# Patient Record
Sex: Male | Born: 1950 | Race: White | Hispanic: No | Smoking: Former smoker
Health system: Southern US, Community
[De-identification: ages and names within clinical notes are randomized; demographics above are authoritative.]

## PROBLEM LIST (undated history)

## (undated) DIAGNOSIS — G4730 Sleep apnea, unspecified: Secondary | ICD-10-CM

## (undated) DIAGNOSIS — I10 Essential (primary) hypertension: Secondary | ICD-10-CM

## (undated) DIAGNOSIS — M1990 Unspecified osteoarthritis, unspecified site: Secondary | ICD-10-CM

## (undated) DIAGNOSIS — E119 Type 2 diabetes mellitus without complications: Secondary | ICD-10-CM

## (undated) HISTORY — PX: ANGIOPLASTY: SHX39

## (undated) HISTORY — PX: BACK SURGERY: SHX140

---

## 1995-11-16 DIAGNOSIS — I219 Acute myocardial infarction, unspecified: Secondary | ICD-10-CM

## 1995-11-16 HISTORY — DX: Acute myocardial infarction, unspecified: I21.9

## 2006-06-27 ENCOUNTER — Encounter: Admission: RE | Admit: 2006-06-27 | Discharge: 2006-06-27 | Payer: Self-pay | Admitting: Family Medicine

## 2006-08-30 ENCOUNTER — Encounter: Admission: RE | Admit: 2006-08-30 | Discharge: 2006-08-30 | Payer: Self-pay | Admitting: Family Medicine

## 2006-10-18 ENCOUNTER — Encounter: Admission: RE | Admit: 2006-10-18 | Discharge: 2006-10-18 | Payer: Self-pay | Admitting: Family Medicine

## 2006-11-10 ENCOUNTER — Encounter: Admission: RE | Admit: 2006-11-10 | Discharge: 2006-11-10 | Payer: Self-pay | Admitting: Family Medicine

## 2008-03-19 ENCOUNTER — Encounter: Admission: RE | Admit: 2008-03-19 | Discharge: 2008-03-19 | Payer: Self-pay | Admitting: Family Medicine

## 2008-04-03 ENCOUNTER — Encounter: Admission: RE | Admit: 2008-04-03 | Discharge: 2008-04-03 | Payer: Self-pay | Admitting: Family Medicine

## 2008-05-30 ENCOUNTER — Encounter: Admission: RE | Admit: 2008-05-30 | Discharge: 2008-05-30 | Payer: Self-pay | Admitting: Family Medicine

## 2008-07-24 ENCOUNTER — Encounter: Admission: RE | Admit: 2008-07-24 | Discharge: 2008-07-24 | Payer: Self-pay | Admitting: Neurosurgery

## 2008-08-13 ENCOUNTER — Ambulatory Visit: Payer: Self-pay | Admitting: Thoracic Surgery

## 2008-09-09 ENCOUNTER — Ambulatory Visit: Payer: Self-pay | Admitting: Thoracic Surgery

## 2008-09-09 ENCOUNTER — Inpatient Hospital Stay (HOSPITAL_COMMUNITY): Admission: RE | Admit: 2008-09-09 | Discharge: 2008-09-13 | Payer: Self-pay | Admitting: Neurosurgery

## 2008-09-24 ENCOUNTER — Ambulatory Visit: Payer: Self-pay | Admitting: Thoracic Surgery

## 2008-09-24 ENCOUNTER — Encounter: Admission: RE | Admit: 2008-09-24 | Discharge: 2008-09-24 | Payer: Self-pay | Admitting: Thoracic Surgery

## 2008-10-16 ENCOUNTER — Encounter: Admission: RE | Admit: 2008-10-16 | Discharge: 2008-10-16 | Payer: Self-pay | Admitting: Thoracic Surgery

## 2008-10-16 ENCOUNTER — Ambulatory Visit: Payer: Self-pay | Admitting: Thoracic Surgery

## 2008-12-18 ENCOUNTER — Encounter: Admission: RE | Admit: 2008-12-18 | Discharge: 2008-12-18 | Payer: Self-pay | Admitting: Thoracic Surgery

## 2008-12-18 ENCOUNTER — Ambulatory Visit: Payer: Self-pay | Admitting: Thoracic Surgery

## 2009-02-12 ENCOUNTER — Ambulatory Visit: Payer: Self-pay | Admitting: Thoracic Surgery

## 2009-06-10 ENCOUNTER — Encounter: Admission: RE | Admit: 2009-06-10 | Discharge: 2009-06-10 | Payer: Self-pay | Admitting: Neurosurgery

## 2009-08-13 IMAGING — CR DG CHEST 1V PORT
1 series · 1 of 1 positions shown · non-contrast
Comparison: Chest radiograph 09/10/2008

CLINICAL DATA: Pneumonia

PORTABLE CHEST - 1 VIEW

[view not recorded]
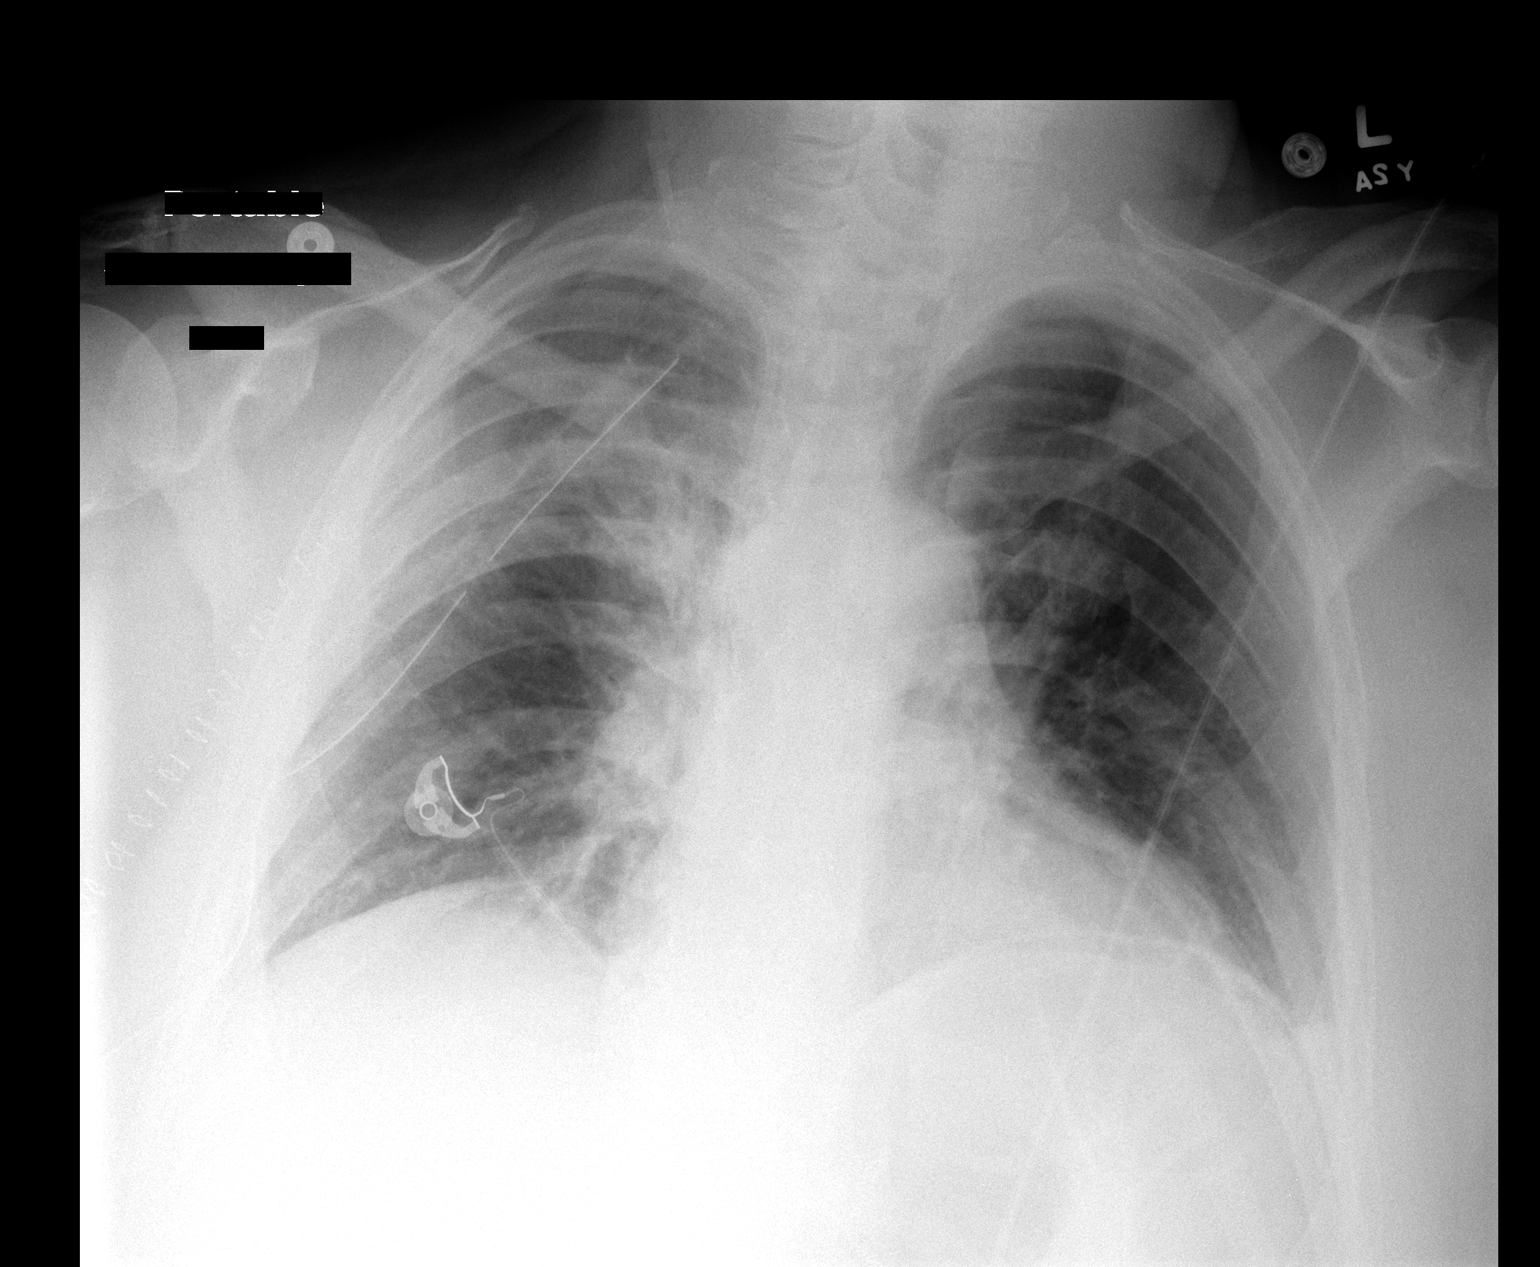

[1 of 1 positions shown; findings below may reference images not displayed]

FINDINGS: Stable cardiac silhouette.  Right central venous line
unchanged.  Interval removal of one right chest tube with a single
remaining right chest tube.  No evidence pneumothorax.  Bibasilar
atelectasis. There is a lucency beneath the left hemidiaphragm
which is felt to represent the colonic splenic flexure.
IMPRESSION: 1. Removal of right chest tube without pneumothorax.  A single
right chest tube remains.
2.  Bibasilar atelectasis.

## 2009-08-15 IMAGING — CR DG CHEST 2V
2 series · 2 of 2 positions shown · non-contrast
Comparison: 09/13/2007.

CLINICAL DATA: Evaluate for pneumonia.  Night sweats.

CHEST - 2 VIEW

[w chest pa]
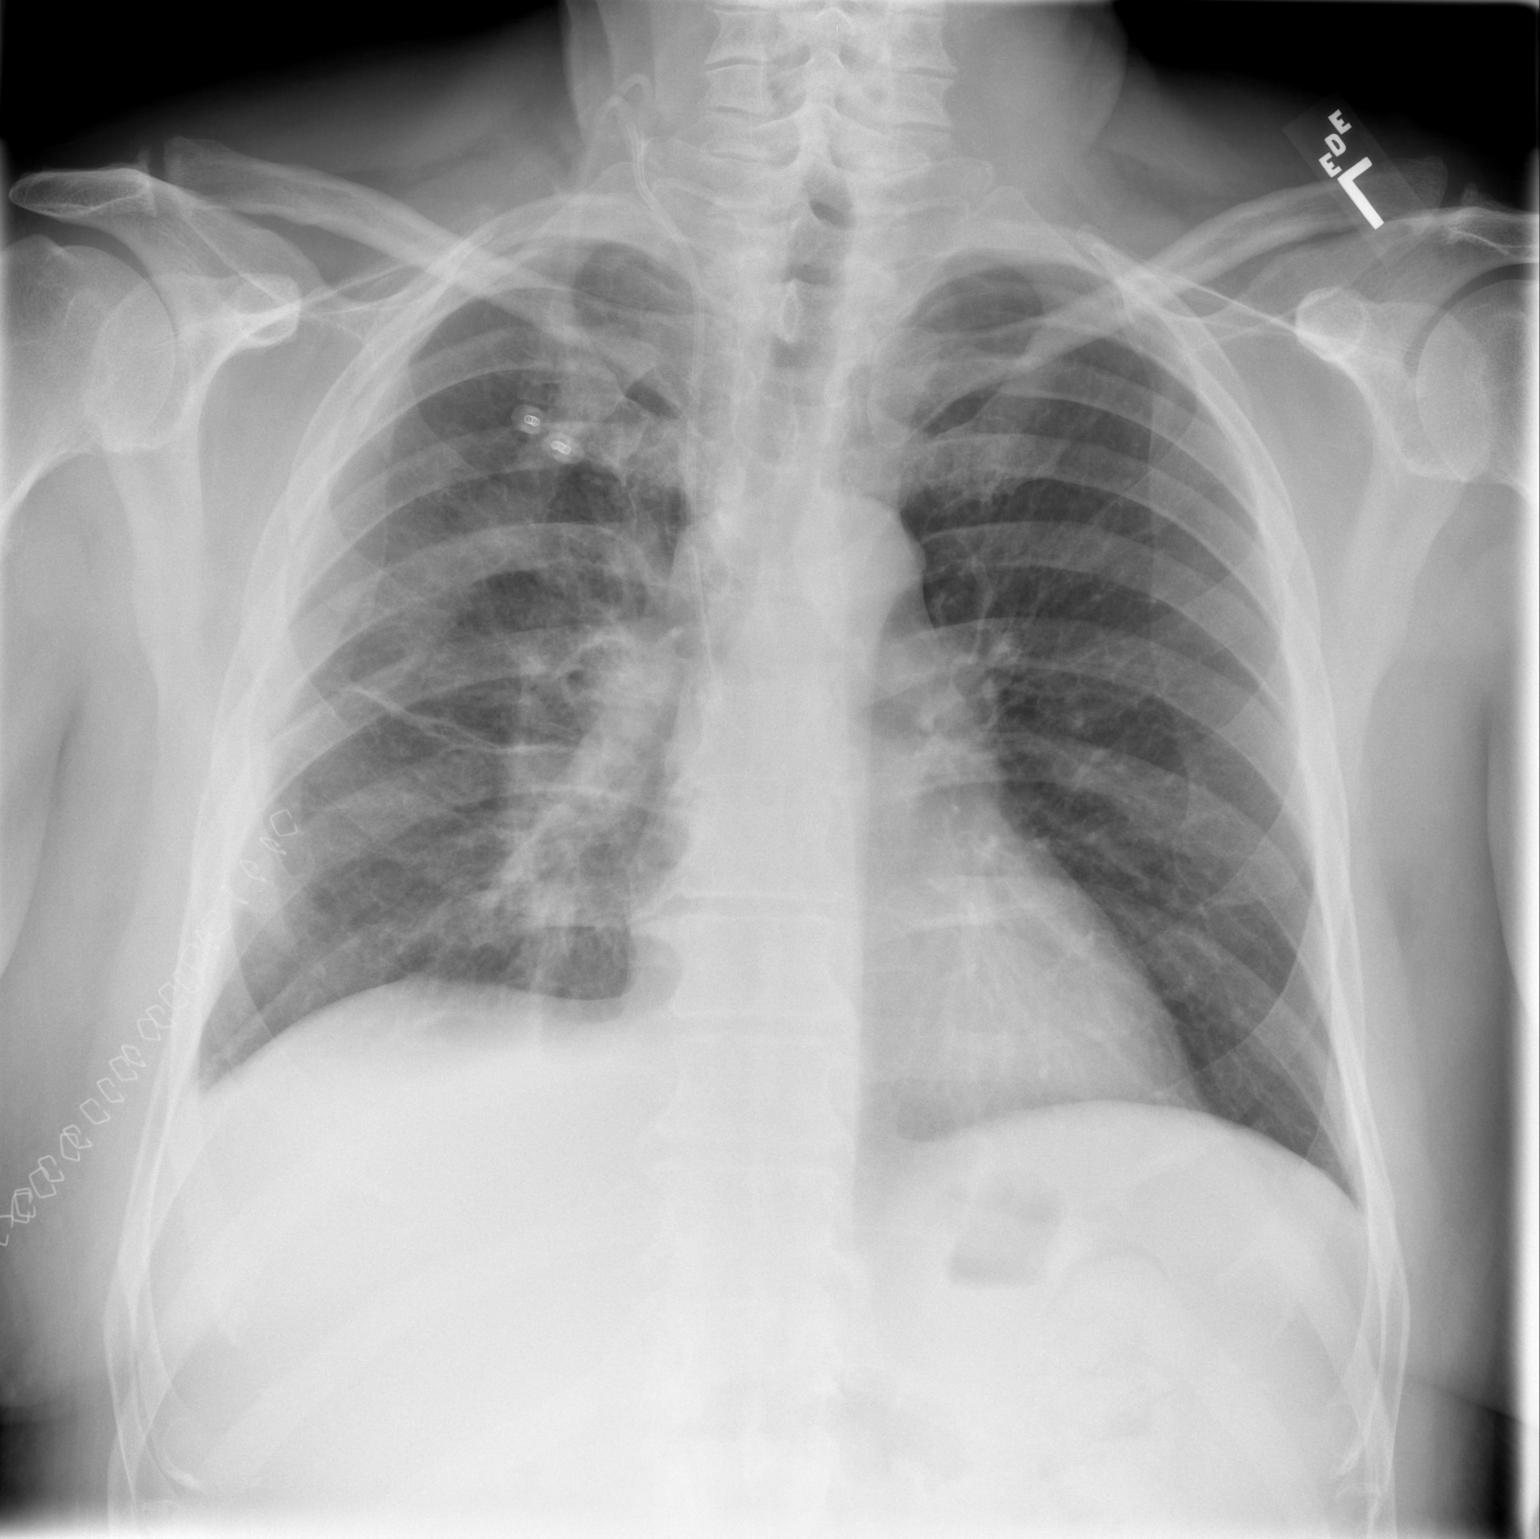

[w chest lat]
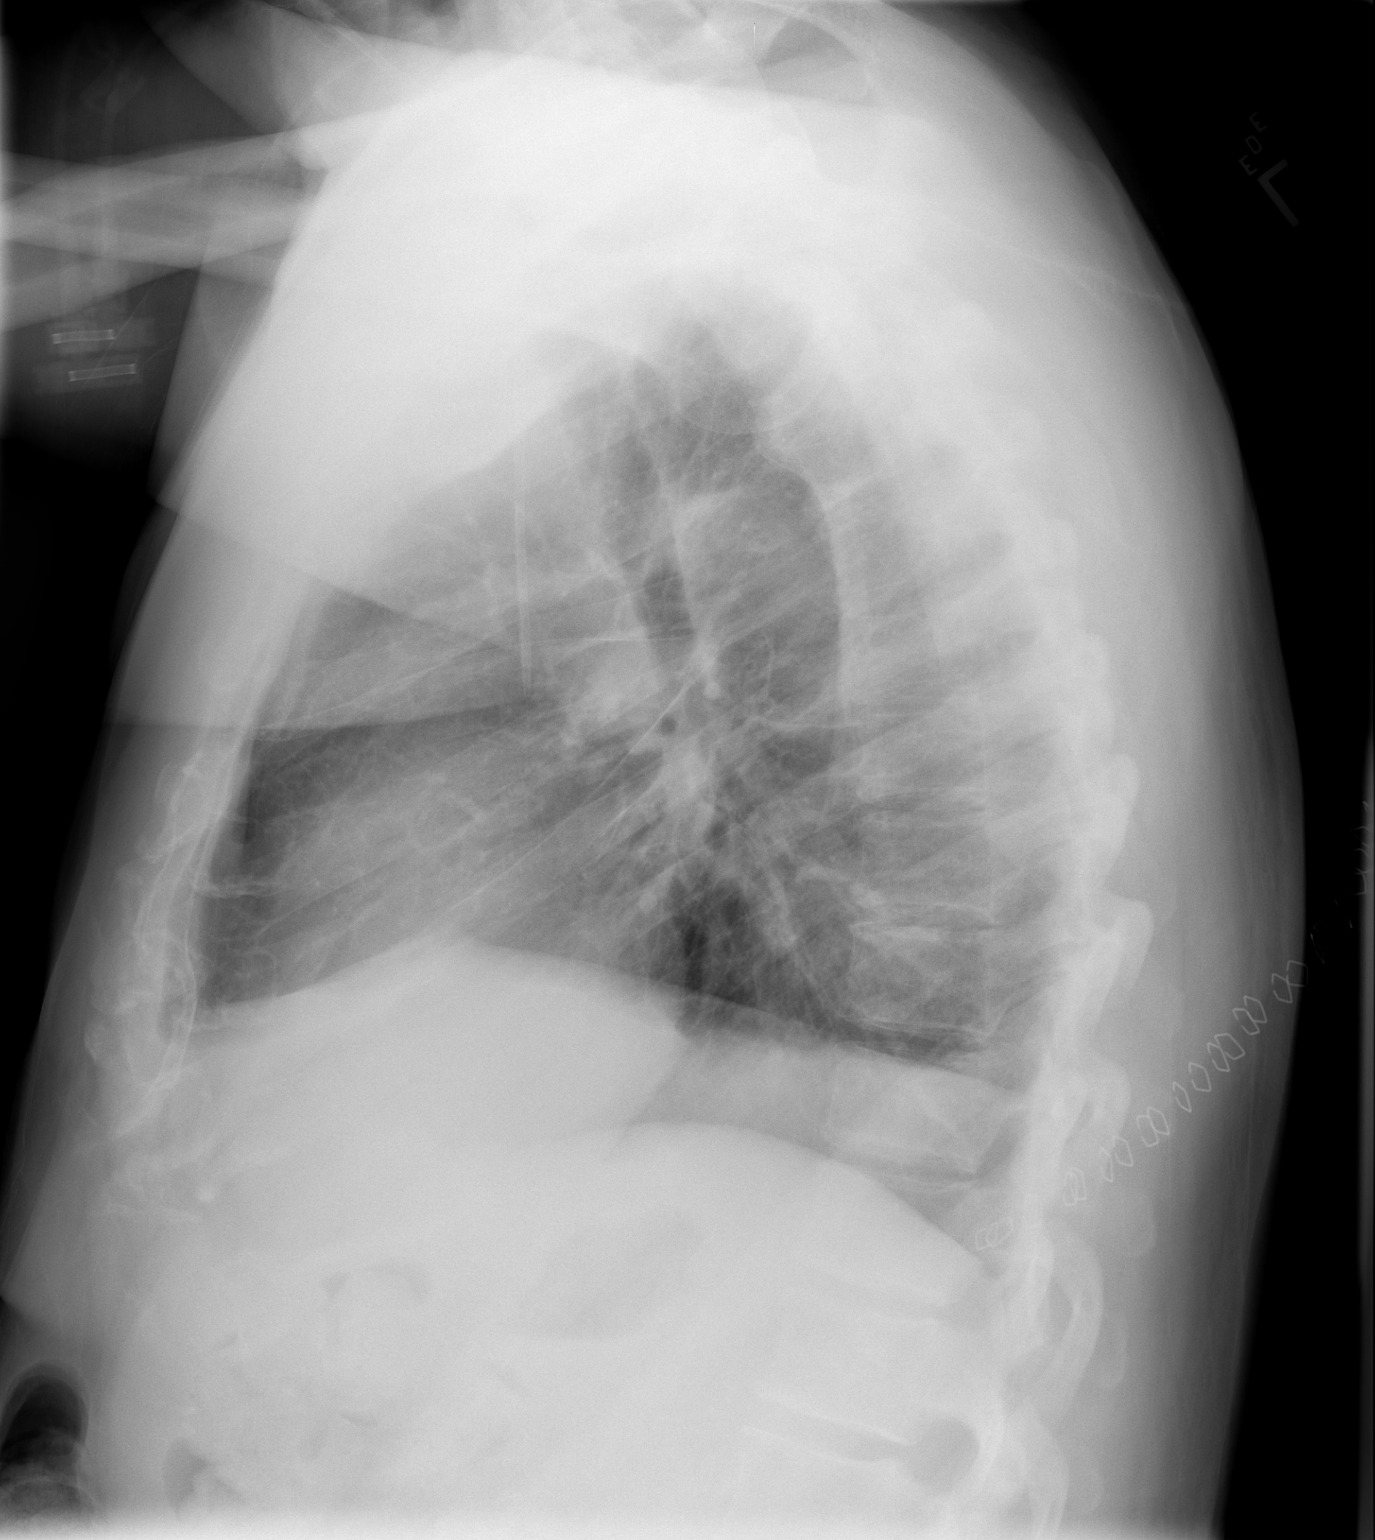

[2 of 2 positions shown; findings below may reference images not displayed]

FINDINGS: Trachea midline.  Right IJ central line unchanged.  No
right pneumothorax.  Skin staples along the right thoracotomy
incision site.  Subsegmental atelectasis is present.  Improved
right pleural effusion, now tiny.  Sub segmental atelectasis is
present without definite airspace disease. Slightly improved
aeration is present in the right base.
IMPRESSION: 1. Slightly improved aeration at the right lung base, with smaller
right pleural effusion.
2.  Stable support apparatus.

## 2009-08-26 IMAGING — CR DG CHEST 2V
2 series · 2 of 2 positions shown · non-contrast
Comparison: Chest radiograph 09/13/2008

CLINICAL DATA: Recent spinal surgery.

CHEST - 2 VIEW

[w chest pa]
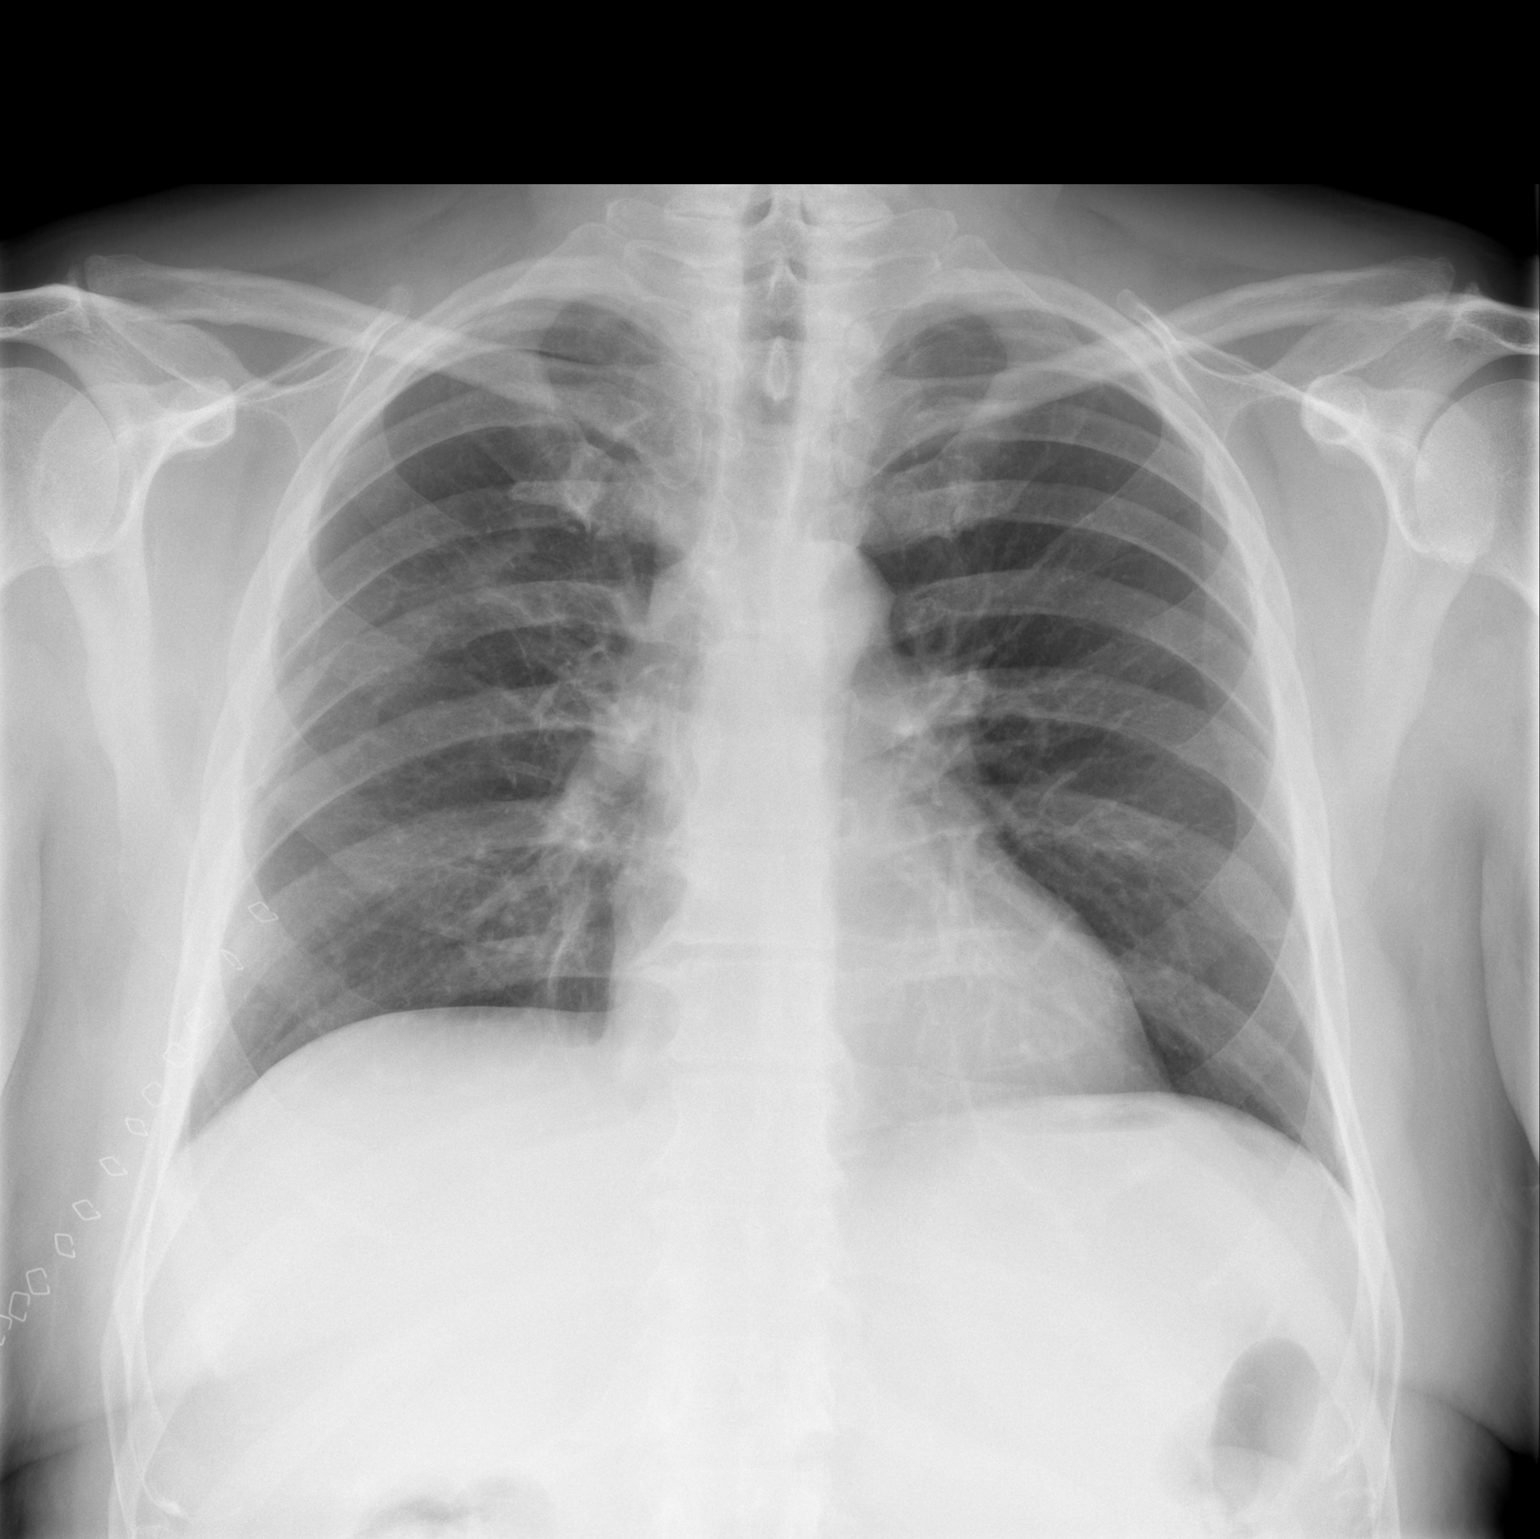

[w chest lat]
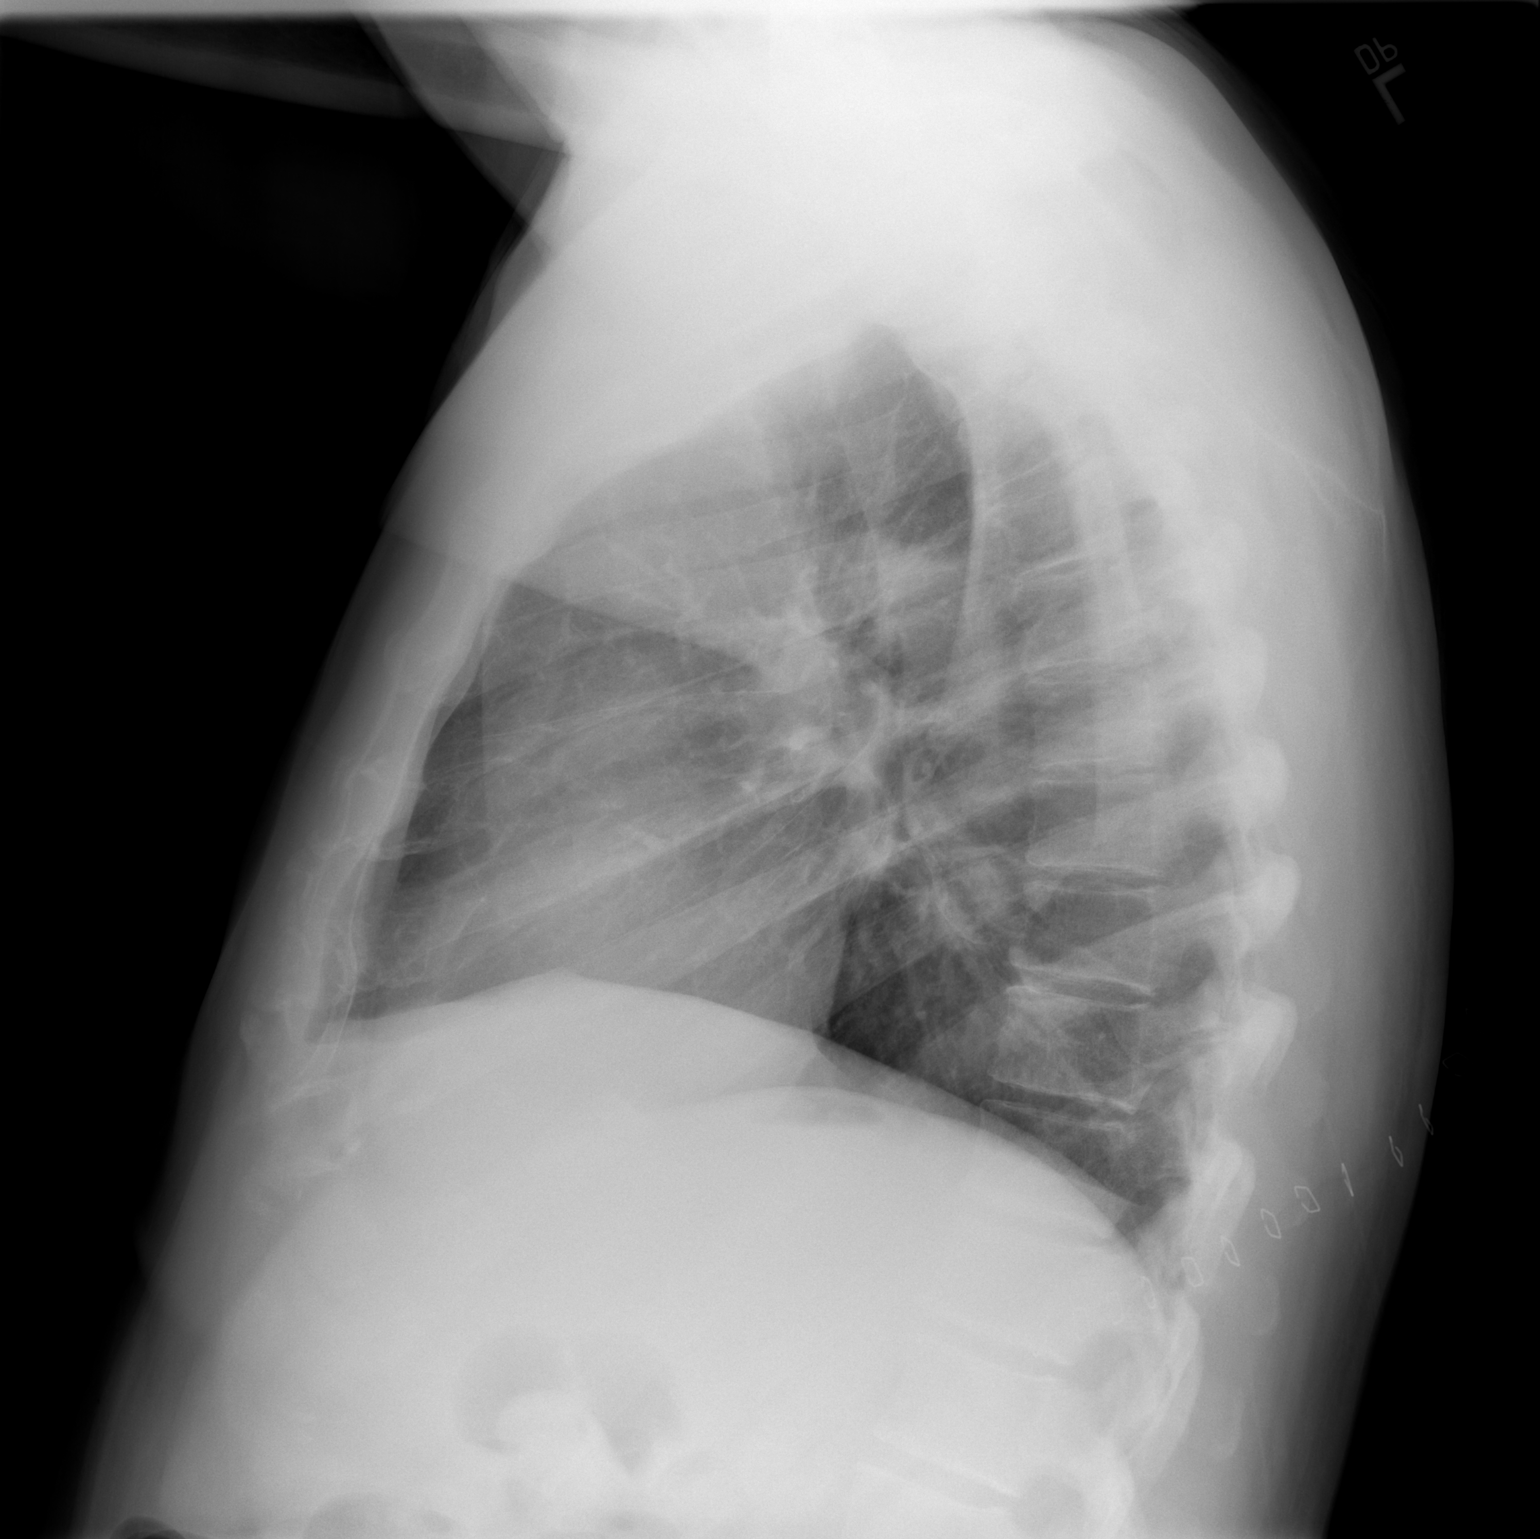

[2 of 2 positions shown; findings below may reference images not displayed]

FINDINGS: Interval removal of central venous line.  No evidence of
pneumothorax.  Normal cardiac silhouette.  Skin staples along the
right flank.  No evidence of pleural effusion.
IMPRESSION: No acute cardiopulmonary process.

## 2009-09-05 ENCOUNTER — Ambulatory Visit (HOSPITAL_COMMUNITY): Admission: RE | Admit: 2009-09-05 | Discharge: 2009-09-06 | Payer: Self-pay | Admitting: Neurosurgery

## 2010-12-07 ENCOUNTER — Encounter: Payer: Self-pay | Admitting: Neurosurgery

## 2011-02-18 LAB — GLUCOSE, CAPILLARY
Glucose-Capillary: 106 mg/dL — ABNORMAL HIGH (ref 70–99)
Glucose-Capillary: 119 mg/dL — ABNORMAL HIGH (ref 70–99)
Glucose-Capillary: 135 mg/dL — ABNORMAL HIGH (ref 70–99)
Glucose-Capillary: 147 mg/dL — ABNORMAL HIGH (ref 70–99)
Glucose-Capillary: 97 mg/dL (ref 70–99)

## 2011-02-18 LAB — CBC
HCT: 39.6 % (ref 39.0–52.0)
Hemoglobin: 13.6 g/dL (ref 13.0–17.0)
MCHC: 34.2 g/dL (ref 30.0–36.0)
MCV: 97.8 fL (ref 78.0–100.0)
Platelets: 170 10*3/uL (ref 150–400)
RBC: 4.05 MIL/uL — ABNORMAL LOW (ref 4.22–5.81)
RDW: 12.9 % (ref 11.5–15.5)
WBC: 8.8 10*3/uL (ref 4.0–10.5)

## 2011-02-18 LAB — BASIC METABOLIC PANEL
BUN: 27 mg/dL — ABNORMAL HIGH (ref 6–23)
CO2: 25 mEq/L (ref 19–32)
Calcium: 9.1 mg/dL (ref 8.4–10.5)
Chloride: 109 mEq/L (ref 96–112)
Creatinine, Ser: 1.47 mg/dL (ref 0.4–1.5)
GFR calc Af Amer: 60 mL/min — ABNORMAL LOW (ref >60)
GFR calc non Af Amer: 50 mL/min — ABNORMAL LOW (ref >60)
Glucose, Bld: 104 mg/dL — ABNORMAL HIGH (ref 70–99)
Potassium: 4.9 mEq/L (ref 3.5–5.1)
Sodium: 140 mEq/L (ref 135–145)

## 2011-02-18 LAB — TYPE AND SCREEN
ABO/RH(D): A POS
Antibody Screen: NEGATIVE

## 2011-02-18 LAB — DIFFERENTIAL
Basophils Absolute: 0 10*3/uL (ref 0.0–0.1)
Basophils Relative: 0 % (ref 0–1)
Eosinophils Absolute: 0.4 10*3/uL (ref 0.0–0.7)
Eosinophils Relative: 4 % (ref 0–5)
Lymphocytes Relative: 21 % (ref 12–46)
Lymphs Abs: 1.8 10*3/uL (ref 0.7–4.0)
Monocytes Absolute: 0.4 10*3/uL (ref 0.1–1.0)
Monocytes Relative: 4 % (ref 3–12)
Neutro Abs: 6.2 10*3/uL (ref 1.7–7.7)
Neutrophils Relative %: 71 % (ref 43–77)

## 2011-03-30 NOTE — Op Note (Signed)
NAME:  Tim Torres, Tim Torres            ACCOUNT NO.:  [id number]   MEDICAL RECORD NO.:  [id number]          PATIENT TYPE:  INP   LOCATION:  3315                         FACILITY:  [facility]   PHYSICIAN:  Kathaleen Maser. Pool, M.D.    DATE OF BIRTH:  10/25/1951   DATE OF PROCEDURE:  09/09/2008  DATE OF DISCHARGE:                               OPERATIVE REPORT   PREOPERATIVE DIAGNOSIS:  Right carpal tunnel syndrome.   POSTOPERATIVE DIAGNOSIS:  Right carpal tunnel syndrome.   PROCEDURE NAME:  Right carpal tunnel release.   SURGEON:  Kathaleen Maser. Pool, MD   ANESTHESIA:  General endotracheal.   PREMEDICATION:  [name] is a 60 year old male with history of right  hand pain, paresthesias, and weakness with a right-sided carpal tunnel  syndrome, confirmed by EMGs and nerve conduction studies, failing  conservative management.  The patient had been counseled as to his  options where he decided to proceed with a right-sided carpal tunnel  release.  This is being done coincidentally with his transthoracic  thoracic and microdiskectomy earlier today.   OPERATIVE NOTE:  The patient was placed on the operative table in a  supine position.  After adequate level of anesthesia achieved, the  patient was positioned with his right arm outstretched.  The patient's  right upper extremity was prepped and draped sterilely.  A #15 blade was  used to make a linear skin incision on the mid palmar crease just distal  to the distal wrist fold.  This carried down sharply through the  subcutaneous tissue.  Palmar fascia was divided.  Transverse carpal  ligament was then identified.  Transverse carpal ligament was then  divided exposing the underlying median nerve.  Freer elevator was then  used to protect the median nerve.  The transverse carpal ligament was  then divided distally into the palm completely releasing its fibers.  The Freer elevator was then redirected proximally.  The transverse  carpal ligament was then  divided into the forearm without difficulty.  At this point, a very thorough decompression of the median nerve had  been achieved.  There was no evidence of injury to the median nerve.  Wound was then irrigated with antibiotic solution.  We then closed in  typical fashion.  A sterile dressing was applied.  There were no  complications.  The patient tolerated the procedure well and he returned  to the recovery room for postoperative care.           ______________________________  Kathaleen Maser. Pool, M.D.     HAP/MEDQ  D:  09/09/2008  T:  09/10/2008  Job:  [id number]

## 2011-03-30 NOTE — Op Note (Signed)
NAMEADYN, SERNA            ACCOUNT NO.:  [id number]   MEDICAL RECORD NO.:  [id number]          PATIENT TYPE:  INP   LOCATION:  3315                         FACILITY:  [facility]   PHYSICIAN:  [name], M.D. DATE OF BIRTH:  17-May-1951   DATE OF PROCEDURE:  09/09/2008  DATE OF DISCHARGE:                               OPERATIVE REPORT   PREOPERATIVE DIAGNOSIS:  Herniated T5-6, 6-7 thoracic disk.   POSTOPERATIVE DIAGNOSIS:  Herniated T5-6, 6-7 thoracic disk.   OPERATIONS PERFORMED:  1. Right thoracotomy.  2. Exposure of T5-6 and 6-7 disk.   SURGEON:  [name], MD   FIRST ASSISTANTS:  1. Stephanie Acre. Dominick, PA-C  2. Henry A. Pool, MD   General anesthesia.   This patient had a T5-6 and 6-7 disk rupture and was undergoing thoracic  diskectomy by Dr. Jordan Likes.  We were asked do to the exposure.   After general anesthesia and insertion of a dual-lumen tube, the patient  turned to the right lateral thoracotomy position, was prepped and draped  in the usual sterile manner.  A posterolateral thoracotomy was made over  the fifth intercostal space with latissimus being divided and the  serratus reflected anteriorly.  Fifth intercostal space was entered.  Two TPAs were placed at right angles.  The right lung was deflated, and  over the fifth rib, the pleura was dissected up dividing the segments of  the fifth and sixth segmentals with electrocautery.  Then the rib head  of the sixth rib was also dissected up and the rib head of the seventh  rib.  After all of these had been done, the T5-6 and 6-7 disks were  identified and by marking them with a dissector and taking a picture.  After this had been done, Dr. Jordan Likes did the diskectomy, which will be  dictated separately.  We were then came in at the end after the  diskectomy, made 2 incisions at the anterior and midaxillary line at the  eighth intercostal space and sutured in 2-0 silks in each incision and  inserted two 28  chest tubes.  Marcaine block was done in the usual  fashion.  A single On-Q was inserted in the usual fashion.  The chest  was closed with 4 pericostals driven through the sixth rib and passing  around the fifth rib.  Five pericostals driven through the sixth rib and  passed around the fifth rib, #1 Vicryl in the muscle layer, 2-0 Vicryl  in the subcutaneous tissue, and Ethicon skin clips.  The patient was  returned to the recovery room.  After this, Dr. Jordan Likes then did a carpal  tunnel, which will be dictated separately.  The lung was re-expanded  under direct vision prior to closure.      [name], M.D.  Electronically Signed     DPB/MEDQ  D:  09/09/2008  T:  09/10/2008  Job:  [id number]

## 2011-03-30 NOTE — Op Note (Signed)
NAMEJANIEL, CRISOSTOMO NO.:  [id number]   MEDICAL RECORD NO.:  [id number]          PATIENT TYPE:  INP   LOCATION:  2899                         FACILITY:  [facility]   PHYSICIAN:  Kathaleen Maser. Pool, M.D.    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   PROCEDURE:  Right transthoracic T5-6 and T6-7 microdiskectomy.   SURGEON:  Kathaleen Maser. Pool, MD   ASSISTANT:  Reinaldo Meeker, MD   THORACIC SURGEON:  [name], MD   ANESTHESIA:  General endotracheal.   INDICATION:  Mr. Delange is a 60 year old male with history of mid back  pain with lower extremity dysfunction consistent with a thoracic  myelopathy.  Workup demonstrates evidence of large central disk  herniations at T5-6 and T6-7 with marked spinal cord compression.  The  patient counseled as to his options.  He decided to proceed with a T5-6  and T6-7 transthoracic microdiskectomy in hopes improving his symptoms.   OPERATIVE NOTE:  The patient was brought to the operating room and  placed on the operative table in supine position.  After adequate level  of anesthesia was achieved, the patient was placed in left lateral  decubitus position and appropriately padded.  The patient's thoracic  region was prepped and draped sterilely.  Dr. Karle Plumber then  performed a right-sided thoracotomy through the fifth interspace.  Retractors were placed.  The lung was immobilized anteromedially after  being deflated with a double-lumen tube.  Pleura overlying the rib heads  at T6 and T7 were immobilized.  Markers were placed in the disk space.  In fact, approach was made to T4-5 and T5-6.  The T4-5 marker was  removed.  Approach was then redirected  to T5-6 and T6-7.  At that point,  I then removed the rib heads of both T6 and T7.  Disk space was then  incised #15 blade in rectangular fashion.  Posterior aspect of the disk  was then removed.  The posterior longitudinal ligament was then  identified.  Microscope was then brought to do the microdissection.  The  bodies of T5 and T6 were then undercut using a Kerrison rongeurs.  Posterior longitudinal ligament was then elevated and resected in  piecemeal fashion using Kerrison rongeurs.  Underlying thecal sac was  then identified.  A large central disk herniation was encountered along  the way.  This was then dissected free using micro nerve hooks and  pituitary rongeurs.  The damaged posterior longitudinal ligament was  then further resected.  All elements of the disk herniation completely  resected.  The dural defect and compression was completely removed.  At  this point, a very thorough decompression achieved.  All elements of the  disk herniation were resected.  There was no injury to thecal sac or  nerve roots.  Procedure was then repeated at T6-7  again without  complications.  Wound was then irrigated with antibiotic solution.  Gelfoam was placed topically at both levels for hemostasis,  which was found to be good.  Microscope retractor system were removed.  Dr. Edwyna Shell now returns to close the chest in typical fashion.  There  were no complications.  The patient tolerated the procedure well.  He  remains in the operating room where the plan is for him to undergo a  right-sided carpal tunnel release.           ______________________________  Kathaleen Maser. Pool, M.D.     HAP/MEDQ  D:  09/09/2008  T:  09/10/2008  Job:  [id number]

## 2011-03-30 NOTE — Letter (Signed)
February 12, 2009   Henry A. Pool, MD  [address]  [place], [place] [address]   Re:  JABBAR, PALMERO                  DOB:  [birth year]   Dear Sherilyn Cooter,   I saw the patient back today.  He still has a moderate amount of pain.  It is hard to tell if this is post thoracotomy which is what I thought  it was last time or if somewhat related to his T5-T6 diskectomy.  He  does have pain more proximal to her incision, which would make me worry  that this may be more of a nerve root problem, but whatever he has it  appears to be stable, maybe slightly improved.  He also complains of  some swelling in his hand secondary to his right hand where he had a  previous carpal tunnel.  So, the patient, although he is functioning  appears to continue to have pain.  I think this will be a long problem.  His incision is well healed.   His blood pressure is 115/69, pulse 68, respirations 18, sats were 95%.   I will let she follow him for long term.  From my standpoint, everything  appears to be stable, but I did tell him I will see him again at any  time if he has any concerns.   Sincerely,   [name], M.D.  Electronically Signed   DPB/MEDQ  D:  02/12/2009  T:  02/13/2009  Job:  [id number]

## 2011-03-30 NOTE — Assessment & Plan Note (Signed)
OFFICE VISIT   Tim Torres, Tim Torres  DOB:  April 21, 1951                                        September 24, 2008  CHART #:  [id number]   The patient came for followup today.  His incisions were healed.  His  blood pressure is 110/63, pulse 68, respirations 18, and sats were 98%.  We removed his staples and his chest tube sutures.  His chest x-ray was  stable after his exposure for T56 and 67.  I will plan to see him back.  I gave him a prescription for Voltaren 75 mg twice a day because of his  increased pain.  I will plan to see him back again in 3 weeks with the  chest x-ray.   Tim Torres, M.D.  Electronically Signed   DPB/MEDQ  D:  09/24/2008  T:  09/24/2008  Job:  [id number]

## 2011-03-30 NOTE — Letter (Signed)
August 13, 2008   Sherilyn Cooter A. Pool, MD  [address] [place], [place] [address]   Re:  [name], [name]                  DOB:  01-22-1951   Dear Sherilyn Cooter,   I appreciate the opportunity of seeing the patient.  This patient was  worked up because of severe back pain radiating to both flanks as well  as down to his legs and shoulders.  He also has numbness in both arms.  Apparently, he had bilateral carpal tunnel syndrome and a herniated disk  at T5-6 and one at T6-7.  He is referred here for discussion for  possible exposure for diskectomy and/or fusion.   MEDICATIONS:  Aspirin, ibuprofen, Toprol, Neurontin 300 mg 3 times a  day, lisinopril 40 mg a day, allopurinol 300 mg a day, lovastatin,  metformin 800 mg, Amaryl 4 mg one-half tablet a day, and Valium.   ALLERGIES:  He is allergic to shellfish.   PAST MEDICAL HISTORY:  He has hypertension, dyslipidemia, and diabetes.   FAMILY HISTORY:  Noncontributory.   SOCIAL HISTORY:  He is married.  He has 2 children.  He is presently  unemployed.  Quit smoking in 1983.  He does not drink alcohol on a  regular basis.   REVIEW OF SYSTEMS:  Vital Signs:  He had some weight loss of 75 pounds  that he has done intentionally.  He is 6 feet 2 inches.  He is 240  pounds.  Cardiac:  No angina or atrial fibrillation.  Pulmonary:  No  hemoptysis, bronchitis, asthma, or wheezing.  GI:  No nausea, vomiting,  constipation, or diarrhea.  GU:  No kidney disease, dysuria, or frequent  urination.  Vascular:  No claudication, DVT, or TIAs.  Neurologic:  No  dizziness, headaches, blackouts, or seizures.  Musculoskeletal:  See  history of present illness.  Psychiatric:  No depression or nervous.  Eye/ENT:  No changes in eyesight or hearing.  Hematologic:  No problems  with bleeding or clotting disorders.   PHYSICAL EXAMINATION:  VITAL SIGNS:  His blood pressure is 121/73, pulse  63, respirations 18, and sats were 94%.  GENERAL:  He is  a well-developed Caucasian male, in no acute distress.  HEAD, EYES, EARS, NOSE, AND THROAT:  Unremarkable.  NECK:  Supple without thyromegaly.  There is no supraclavicular or  axillary adenopathy.  CHEST:  Clear to auscultation and percussion.  HEART:  Regular sinus rhythm.  No murmurs.  ABDOMEN:  Soft.  There is no hepatosplenomegaly.  Pulses are 2+.  There  is no clubbing or edema.  NEUROLOGIC:  He is oriented x3.  Sensory and motor intact.  Cranial  nerves intact.   I think he would need a right thoracotomy for exposure of 5, 6, and 7  vertebral bodies.  I have discussed the risk of procedure to include  infection, bleeding, and pain.  We will call your office and schedule  the patient.  I appreciate the opportunity of seeing the patient.   Tim Torres, M.D.  Electronically Signed   DPB/MEDQ  D:  08/13/2008  T:  08/13/2008  Job:  [id number]

## 2011-03-30 NOTE — Assessment & Plan Note (Signed)
OFFICE VISIT   Tim Torres, Tim Torres  DOB:  1951/09/13                                        October 16, 2008  CHART #:  [id number]   HISTORY OF PRESENT ILLNESS:  This is a 60 year old Caucasian male, who  is status post right thoracotomy, exposure T5-6 and T6-7 by Dr. Edwyna Shell  and Dr. Jordan Likes on September 09, 2008.  The patient was last seen in the  office on September 24, 2008.  At that time, his only complaint was  incisional pain.  He was given Voltaren 75 mg p.o. 2 times daily.  He  did take this for several days.  He states, however, he found ibuprofen  alleviate his pain better.  He still currently has occasional incisional  pain.  Denies any shortness of breath.   PHYSICAL EXAMINATION:  GENERAL:  This is a pleasant 60 year old  Caucasian male, who is in no acute distress, who is alert and  cooperative.  VITAL SIGNS:  BP 105/63, pulse rate 74, respirations 18, and O2 sat 96%  on room air.  CARDIOVASCULAR:  Regular rate and rhythm.  PULMONARY:  Lungs are clear to auscultation bilaterally.  No rales,  wheezes, or rhonchi.  CHEST:  Right posterior chest wounds clean, dry, and well healed.   Chest x-ray done today revealed mild peribronchial thickening.  No  infiltrate or effusion seen, however.   IMPRESSION AND PLAN:  1. Surgically stable, status post right thoracotomy and exposure T5-6      and T6-7.  2. The patient will return to see Dr. Edwyna Shell in 2 months with a chest      x-ray.  3. The patient has another followup appointment to see Dr. Jordan Likes on      November 27, 2008.  The patient is to contact to office if any      further questions or problems in the interim.   Tim Torres, M.D.  Electronically Signed   DZ/MEDQ  D:  10/16/2008  T:  10/16/2008  Job:  [id number]   cc:   Tim Torres, M.D.

## 2011-03-30 NOTE — Letter (Signed)
December 18, 2008   Henry A. Pool, MD  [address]  [place], [place] [address]   Re:  Tim Torres, SENNE                  DOB:  1951-11-01   Dear [name],   I saw the patient back in the office today.  He is still having a  moderate amount of pain, had his T5-T6 discectomy.  He states that this  is gradually improving, but he still has pain mainly at the anterior  axial line near the costal margin.  This sounds like more  postthoracotomy pain than disc pain, but since he was in the same level,  I really can not tell.  He does say he is gradually getting better.  His  chest x-ray showed normal postoperative pain.  His blood pressure was  111/64, pulse 64, respirations 18, and sats were 97%.  I will see him  back again in 2 months for a final check.  Hopefully, his pain will be  improved at that time.   Sincerely,   [name], M.D.  Electronically Signed   DPB/MEDQ  D:  12/18/2008  T:  12/18/2008  Job:  [id number]

## 2011-04-02 NOTE — Discharge Summary (Signed)
NAME:  Tim Torres, Tim Torres            ACCOUNT NO.:  [id number]   MEDICAL RECORD NO.:  [id number]          PATIENT TYPE:  INP   LOCATION:  3028                         FACILITY:  [facility]   PHYSICIAN:  Kathaleen Maser. Pool, M.D.    DATE OF BIRTH:  Nov 26, 1950   DATE OF ADMISSION:  09/09/2008  DATE OF DISCHARGE:  09/13/2008                               DISCHARGE SUMMARY   FINAL DIAGNOSES:  1. T5-6 and T6-7 herniated nucleus pulposus with myelopathy.  2. Right-sided carpal tunnel syndrome.   HISTORY OF PRESENT ILLNESS:  [name] is a 60 year old male with  incapacitating mid back pain and lower extremity symptoms consistent  with thoracic myelopathy.  Workup demonstrates evidence of significant  disk herniations at T5-6 and T6-7 with subsequent spinal cord  compression.  The patient also has coincidental right-sided carpal  tunnel syndrome of a severe nature confirmed by EMGs and nerve  conduction studies.  The patient presents now for surgery in hopes of  improving his symptoms.   HOSPITAL COURSE:  The patient was taken to the operating room where  uncomplicated right-sided transthoracic microdiskectomy at T5-6 and T6-7  were performed.  He subsequently underwent a right-sided carpal tunnel  release.  Postoperatively, the patient did very well.  His back pain and  lower extremity symptoms were resolved.  Strength and sensation were  intact.  Wound was healing well.  He was gradually mobilized.  Chest  tubes were removed in a routine fashion after x-rays confirmed  resolution of pneumothorax.  At the time of discharge, the patient is  ambulating without difficulty.  He is tolerating regular diet.  His pain  is well controlled with oral medication.  His carpal tunnel syndrome  symptoms have resolved.  Overall, he is doing very well and happy with  his course.   CONDITION AT DISCHARGE:  Improved.   DISCHARGE DISPOSITION:  The patient will follow up in 1 week in my  office.  He will also  follow up with Dr. Edwyna Shell.            ______________________________  Kathaleen Maser Pool, M.D.     HAP/MEDQ  D:  10/22/2008  T:  10/23/2008  Job:  [id number]

## 2011-08-17 LAB — COMPREHENSIVE METABOLIC PANEL
ALT: 22
ALT: 28
AST: 23
AST: 29
Albumin: 2.9 — ABNORMAL LOW
Albumin: 3.9
Alkaline Phosphatase: 66
Alkaline Phosphatase: 76
BUN: 18
BUN: 24 — ABNORMAL HIGH
CO2: 22
CO2: 29
Calcium: 8.9
Calcium: 9.4
Chloride: 105
Chloride: 106
Creatinine, Ser: 1.22
Creatinine, Ser: 1.41
GFR calc Af Amer: >60
GFR calc Af Amer: >60
GFR calc non Af Amer: 52 — ABNORMAL LOW
GFR calc non Af Amer: >60
Glucose, Bld: 105 — ABNORMAL HIGH
Glucose, Bld: 113 — ABNORMAL HIGH
Potassium: 4
Potassium: 4.9
Sodium: 137
Sodium: 139
Total Bilirubin: 0.9
Total Bilirubin: 1.1
Total Protein: 5.7 — ABNORMAL LOW
Total Protein: 6.4

## 2011-08-17 LAB — CBC
HCT: 31.9 — ABNORMAL LOW
HCT: 32.7 — ABNORMAL LOW
HCT: 37.6 — ABNORMAL LOW
HCT: 40.3
Hemoglobin: 11.1 — ABNORMAL LOW
Hemoglobin: 11.3 — ABNORMAL LOW
Hemoglobin: 12.7 — ABNORMAL LOW
Hemoglobin: 13.8
MCHC: 33.8
MCHC: 34.1
MCHC: 34.5
MCHC: 34.7
MCV: 95.2
MCV: 95.2
MCV: 95.4
MCV: 95.7
Platelets: 148 — ABNORMAL LOW
Platelets: 148 — ABNORMAL LOW
Platelets: 175
Platelets: 192
RBC: 3.33 — ABNORMAL LOW
RBC: 3.43 — ABNORMAL LOW
RBC: 3.96 — ABNORMAL LOW
RBC: 4.23
RDW: 13.1
RDW: 13.4
RDW: 13.5
RDW: 13.6
WBC: 5.2
WBC: 7.7
WBC: 8.2
WBC: 8.8

## 2011-08-17 LAB — BASIC METABOLIC PANEL
BUN: 24 — ABNORMAL HIGH
BUN: 28 — ABNORMAL HIGH
BUN: 28 — ABNORMAL HIGH
CO2: 23
CO2: 25
CO2: 26
Calcium: 8.3 — ABNORMAL LOW
Calcium: 8.5
Calcium: 8.6
Chloride: 106
Chloride: 108
Chloride: 108
Creatinine, Ser: 1.12
Creatinine, Ser: 1.4
Creatinine, Ser: 1.46
GFR calc Af Amer: >60
GFR calc Af Amer: >60
GFR calc Af Amer: >60
GFR calc non Af Amer: 50 — ABNORMAL LOW
GFR calc non Af Amer: 53 — ABNORMAL LOW
GFR calc non Af Amer: >60
Glucose, Bld: 125 — ABNORMAL HIGH
Glucose, Bld: 135 — ABNORMAL HIGH
Glucose, Bld: 144 — ABNORMAL HIGH
Potassium: 4.1
Potassium: 4.4
Potassium: 5
Sodium: 137
Sodium: 139
Sodium: 139

## 2011-08-17 LAB — BLOOD GAS, ARTERIAL
Acid-Base Excess: 0.2
Bicarbonate: 24.5 — ABNORMAL HIGH
Drawn by: 206361
FIO2: 0.21
O2 Saturation: 98.5
Patient temperature: 98.6
TCO2: 25.7
pCO2 arterial: 41
pH, Arterial: 7.393
pO2, Arterial: 100

## 2011-08-17 LAB — TYPE AND SCREEN
ABO/RH(D): A POS
Antibody Screen: NEGATIVE

## 2011-08-17 LAB — POCT I-STAT 4, (NA,K, GLUC, HGB,HCT)
Glucose, Bld: 146 — ABNORMAL HIGH
HCT: 36 — ABNORMAL LOW
Hemoglobin: 12.2 — ABNORMAL LOW
Potassium: 4.5
Sodium: 138

## 2011-08-17 LAB — GLUCOSE, CAPILLARY
Glucose-Capillary: 102 — ABNORMAL HIGH
Glucose-Capillary: 109 — ABNORMAL HIGH
Glucose-Capillary: 110 — ABNORMAL HIGH
Glucose-Capillary: 111 — ABNORMAL HIGH
Glucose-Capillary: 115 — ABNORMAL HIGH
Glucose-Capillary: 115 — ABNORMAL HIGH
Glucose-Capillary: 116 — ABNORMAL HIGH
Glucose-Capillary: 116 — ABNORMAL HIGH
Glucose-Capillary: 118 — ABNORMAL HIGH
Glucose-Capillary: 120 — ABNORMAL HIGH
Glucose-Capillary: 130 — ABNORMAL HIGH
Glucose-Capillary: 134 — ABNORMAL HIGH
Glucose-Capillary: 145 — ABNORMAL HIGH
Glucose-Capillary: 58 — ABNORMAL LOW
Glucose-Capillary: 62 — ABNORMAL LOW
Glucose-Capillary: 70
Glucose-Capillary: 84
Glucose-Capillary: 94
Glucose-Capillary: 97

## 2011-08-17 LAB — URINALYSIS, ROUTINE W REFLEX MICROSCOPIC
Bilirubin Urine: NEGATIVE
Glucose, UA: NEGATIVE
Hgb urine dipstick: NEGATIVE
Ketones, ur: NEGATIVE
Nitrite: NEGATIVE
Protein, ur: NEGATIVE
Specific Gravity, Urine: 1.027
Urobilinogen, UA: 0.2
pH: 5.5

## 2011-08-17 LAB — ABO/RH: ABO/RH(D): A POS

## 2011-08-17 LAB — PROTIME-INR
INR: 1
Prothrombin Time: 12.8

## 2011-08-17 LAB — APTT: aPTT: 31

## 2012-06-26 ENCOUNTER — Other Ambulatory Visit: Payer: Self-pay | Admitting: Family Medicine

## 2012-06-26 DIAGNOSIS — IMO0002 Reserved for concepts with insufficient information to code with codable children: Secondary | ICD-10-CM

## 2012-06-26 DIAGNOSIS — M542 Cervicalgia: Secondary | ICD-10-CM

## 2012-07-07 ENCOUNTER — Ambulatory Visit
Admission: RE | Admit: 2012-07-07 | Discharge: 2012-07-07 | Disposition: A | Discharge status: Home / Self Care | Payer: Medicare PPO | Source: Ambulatory Visit | Attending: Family Medicine | Admitting: Family Medicine

## 2012-07-07 DIAGNOSIS — IMO0002 Reserved for concepts with insufficient information to code with codable children: Secondary | ICD-10-CM

## 2012-07-07 DIAGNOSIS — M542 Cervicalgia: Secondary | ICD-10-CM

## 2012-07-07 MED ORDER — TRIAMCINOLONE ACETONIDE 40 MG/ML IJ SUSP (RADIOLOGY)
60.0000 mg | Freq: Once | INTRAMUSCULAR | Status: AC
  Administered 2012-07-07: 60 mg via EPIDURAL

## 2012-07-07 MED ORDER — IOHEXOL 300 MG/ML  SOLN
1.0000 mL | Freq: Once | INTRAMUSCULAR | Status: AC | PRN
  Administered 2012-07-07: 1 mL via EPIDURAL

## 2013-07-30 DIAGNOSIS — M109 Gout, unspecified: Secondary | ICD-10-CM | POA: Insufficient documentation

## 2019-09-21 ENCOUNTER — Other Ambulatory Visit: Payer: Self-pay | Admitting: Neurosurgery

## 2019-09-21 DIAGNOSIS — M5416 Radiculopathy, lumbar region: Secondary | ICD-10-CM

## 2019-09-23 ENCOUNTER — Encounter (HOSPITAL_COMMUNITY): Payer: Self-pay

## 2019-09-23 ENCOUNTER — Emergency Department (HOSPITAL_COMMUNITY): Payer: Medicare PPO

## 2019-09-23 ENCOUNTER — Observation Stay (HOSPITAL_COMMUNITY)
Admission: EM | Admit: 2019-09-23 | Discharge: 2019-09-24 | Disposition: A | Discharge status: Home / Self Care | Payer: Medicare PPO | Attending: Internal Medicine | Admitting: Internal Medicine

## 2019-09-23 ENCOUNTER — Other Ambulatory Visit: Payer: Self-pay

## 2019-09-23 DIAGNOSIS — G4733 Obstructive sleep apnea (adult) (pediatric): Secondary | ICD-10-CM | POA: Insufficient documentation

## 2019-09-23 DIAGNOSIS — M5136 Other intervertebral disc degeneration, lumbar region: Secondary | ICD-10-CM | POA: Diagnosis not present

## 2019-09-23 DIAGNOSIS — Z9889 Other specified postprocedural states: Secondary | ICD-10-CM | POA: Insufficient documentation

## 2019-09-23 DIAGNOSIS — Z91013 Allergy to seafood: Secondary | ICD-10-CM | POA: Diagnosis not present

## 2019-09-23 DIAGNOSIS — M79604 Pain in right leg: Secondary | ICD-10-CM | POA: Diagnosis not present

## 2019-09-23 DIAGNOSIS — R270 Ataxia, unspecified: Secondary | ICD-10-CM | POA: Diagnosis not present

## 2019-09-23 DIAGNOSIS — R200 Anesthesia of skin: Secondary | ICD-10-CM | POA: Insufficient documentation

## 2019-09-23 DIAGNOSIS — E785 Hyperlipidemia, unspecified: Secondary | ICD-10-CM

## 2019-09-23 DIAGNOSIS — I2510 Atherosclerotic heart disease of native coronary artery without angina pectoris: Secondary | ICD-10-CM

## 2019-09-23 DIAGNOSIS — Z888 Allergy status to other drugs, medicaments and biological substances status: Secondary | ICD-10-CM | POA: Insufficient documentation

## 2019-09-23 DIAGNOSIS — Z79899 Other long term (current) drug therapy: Secondary | ICD-10-CM | POA: Diagnosis not present

## 2019-09-23 DIAGNOSIS — R42 Dizziness and giddiness: Secondary | ICD-10-CM | POA: Diagnosis present

## 2019-09-23 DIAGNOSIS — H8110 Benign paroxysmal vertigo, unspecified ear: Secondary | ICD-10-CM | POA: Diagnosis not present

## 2019-09-23 DIAGNOSIS — H5500 Unspecified nystagmus: Secondary | ICD-10-CM | POA: Diagnosis not present

## 2019-09-23 DIAGNOSIS — M79605 Pain in left leg: Secondary | ICD-10-CM | POA: Insufficient documentation

## 2019-09-23 DIAGNOSIS — Z7984 Long term (current) use of oral hypoglycemic drugs: Secondary | ICD-10-CM | POA: Insufficient documentation

## 2019-09-23 DIAGNOSIS — Z20828 Contact with and (suspected) exposure to other viral communicable diseases: Secondary | ICD-10-CM | POA: Insufficient documentation

## 2019-09-23 DIAGNOSIS — I10 Essential (primary) hypertension: Secondary | ICD-10-CM | POA: Diagnosis not present

## 2019-09-23 DIAGNOSIS — Z7982 Long term (current) use of aspirin: Secondary | ICD-10-CM | POA: Diagnosis not present

## 2019-09-23 DIAGNOSIS — E119 Type 2 diabetes mellitus without complications: Secondary | ICD-10-CM | POA: Diagnosis not present

## 2019-09-23 DIAGNOSIS — M109 Gout, unspecified: Secondary | ICD-10-CM | POA: Insufficient documentation

## 2019-09-23 DIAGNOSIS — M1990 Unspecified osteoarthritis, unspecified site: Secondary | ICD-10-CM

## 2019-09-23 DIAGNOSIS — E1159 Type 2 diabetes mellitus with other circulatory complications: Secondary | ICD-10-CM

## 2019-09-23 DIAGNOSIS — E1122 Type 2 diabetes mellitus with diabetic chronic kidney disease: Secondary | ICD-10-CM

## 2019-09-23 DIAGNOSIS — E1169 Type 2 diabetes mellitus with other specified complication: Secondary | ICD-10-CM

## 2019-09-23 HISTORY — DX: Type 2 diabetes mellitus without complications: E11.9

## 2019-09-23 HISTORY — DX: Essential (primary) hypertension: I10

## 2019-09-23 LAB — COMPREHENSIVE METABOLIC PANEL
ALT: 26 U/L (ref 0–44)
AST: 25 U/L (ref 15–41)
Albumin: 4.3 g/dL (ref 3.5–5.0)
Alkaline Phosphatase: 69 U/L (ref 38–126)
Anion gap: 10 (ref 5–15)
BUN: 24 mg/dL — ABNORMAL HIGH (ref 8–23)
CO2: 19 mmol/L — ABNORMAL LOW (ref 22–32)
Calcium: 9.3 mg/dL (ref 8.9–10.3)
Chloride: 106 mmol/L (ref 98–111)
Creatinine, Ser: 1.08 mg/dL (ref 0.61–1.24)
GFR calc Af Amer: >60 mL/min (ref >60)
GFR calc non Af Amer: >60 mL/min (ref >60)
Glucose, Bld: 186 mg/dL — ABNORMAL HIGH (ref 70–99)
Potassium: 3.5 mmol/L (ref 3.5–5.1)
Sodium: 135 mmol/L (ref 135–145)
Total Bilirubin: 1 mg/dL (ref 0.3–1.2)
Total Protein: 7.6 g/dL (ref 6.5–8.1)

## 2019-09-23 LAB — ETHANOL: Alcohol, Ethyl (B): <10 mg/dL (ref <10)

## 2019-09-23 LAB — PROTIME-INR
INR: 1.1 (ref 0.8–1.2)
Prothrombin Time: 14.4 seconds (ref 11.4–15.2)

## 2019-09-23 LAB — CBC
HCT: 38.5 % — ABNORMAL LOW (ref 39.0–52.0)
Hemoglobin: 13.2 g/dL (ref 13.0–17.0)
MCH: 31.9 pg (ref 26.0–34.0)
MCHC: 34.3 g/dL (ref 30.0–36.0)
MCV: 93 fL (ref 80.0–100.0)
Platelets: 156 10*3/uL (ref 150–400)
RBC: 4.14 MIL/uL — ABNORMAL LOW (ref 4.22–5.81)
RDW: 13.6 % (ref 11.5–15.5)
WBC: 8.5 10*3/uL (ref 4.0–10.5)
nRBC: 0 % (ref 0.0–0.2)

## 2019-09-23 LAB — I-STAT CHEM 8, ED
BUN: 23 mg/dL (ref 8–23)
Calcium, Ion: 1.2 mmol/L (ref 1.15–1.40)
Chloride: 116 mmol/L — ABNORMAL HIGH (ref 98–111)
Creatinine, Ser: 1 mg/dL (ref 0.61–1.24)
Glucose, Bld: 177 mg/dL — ABNORMAL HIGH (ref 70–99)
HCT: 37 % — ABNORMAL LOW (ref 39.0–52.0)
Hemoglobin: 12.6 g/dL — ABNORMAL LOW (ref 13.0–17.0)
Potassium: 3.7 mmol/L (ref 3.5–5.1)
Sodium: 140 mmol/L (ref 135–145)
TCO2: 19 mmol/L — ABNORMAL LOW (ref 22–32)

## 2019-09-23 LAB — DIFFERENTIAL
Abs Immature Granulocytes: 0.02 10*3/uL (ref 0.00–0.07)
Basophils Absolute: 0 10*3/uL (ref 0.0–0.1)
Basophils Relative: 0 %
Eosinophils Absolute: 0.5 10*3/uL (ref 0.0–0.5)
Eosinophils Relative: 5 %
Immature Granulocytes: 0 %
Lymphocytes Relative: 14 %
Lymphs Abs: 1.2 10*3/uL (ref 0.7–4.0)
Monocytes Absolute: 0.4 10*3/uL (ref 0.1–1.0)
Monocytes Relative: 5 %
Neutro Abs: 6.4 10*3/uL (ref 1.7–7.7)
Neutrophils Relative %: 76 %

## 2019-09-23 LAB — APTT: aPTT: 32 seconds (ref 24–36)

## 2019-09-23 MED ORDER — BISACODYL 10 MG RE SUPP: 10.0000 mg | Freq: Every day | RECTAL | Status: DC | PRN

## 2019-09-23 MED ORDER — POLYETHYLENE GLYCOL 3350 17 G PO PACK: 17.0000 g | PACK | Freq: Every day | ORAL | Status: DC | PRN

## 2019-09-23 MED ORDER — SODIUM CHLORIDE 0.9% FLUSH
3.0000 mL | Freq: Two times a day (BID) | INTRAVENOUS | Status: DC
  Administered 2019-09-24: 09:00:00 3 mL via INTRAVENOUS

## 2019-09-23 MED ORDER — LACTATED RINGERS IV SOLN
INTRAVENOUS | Status: DC
  Administered 2019-09-23: 23:00:00 via INTRAVENOUS

## 2019-09-23 MED ORDER — ONDANSETRON HCL 4 MG/2ML IJ SOLN: 4.0000 mg | Freq: Four times a day (QID) | INTRAMUSCULAR | Status: DC | PRN

## 2019-09-23 MED ORDER — ONDANSETRON HCL 4 MG PO TABS: 4.0000 mg | ORAL_TABLET | Freq: Four times a day (QID) | ORAL | Status: DC | PRN

## 2019-09-23 MED ORDER — ACETAMINOPHEN 325 MG PO TABS: 650.0000 mg | ORAL_TABLET | Freq: Four times a day (QID) | ORAL | Status: DC | PRN

## 2019-09-23 MED ORDER — GABAPENTIN 300 MG PO CAPS
300.0000 mg | ORAL_CAPSULE | Freq: Three times a day (TID) | ORAL | Status: DC
  Administered 2019-09-23 – 2019-09-24 (×2): 300 mg via ORAL
  Filled 2019-09-23 (×2): qty 1

## 2019-09-23 MED ORDER — ACETAMINOPHEN 650 MG RE SUPP: 650.0000 mg | Freq: Four times a day (QID) | RECTAL | Status: DC | PRN

## 2019-09-23 MED ORDER — MECLIZINE HCL 12.5 MG PO TABS
25.0000 mg | ORAL_TABLET | Freq: Once | ORAL | Status: AC
  Administered 2019-09-23: 18:00:00 25 mg via ORAL
  Filled 2019-09-23: qty 2

## 2019-09-23 MED ORDER — ZOLPIDEM TARTRATE 5 MG PO TABS
5.0000 mg | ORAL_TABLET | Freq: Every day | ORAL | Status: DC
  Administered 2019-09-24: 01:00:00 5 mg via ORAL
  Filled 2019-09-23: qty 1

## 2019-09-23 MED ORDER — INSULIN ASPART 100 UNIT/ML ~~LOC~~ SOLN: 0.0000 [IU] | Freq: Three times a day (TID) | SUBCUTANEOUS | Status: DC

## 2019-09-23 MED ORDER — MECLIZINE HCL 12.5 MG PO TABS: 25.0000 mg | ORAL_TABLET | Freq: Three times a day (TID) | ORAL | Status: DC | PRN

## 2019-09-23 MED ORDER — HYDROCODONE-ACETAMINOPHEN 5-325 MG PO TABS
0.5000 | ORAL_TABLET | Freq: Four times a day (QID) | ORAL | Status: DC | PRN
  Administered 2019-09-23: 23:00:00 1 via ORAL
  Filled 2019-09-23: qty 1

## 2019-09-23 MED ORDER — METOPROLOL TARTRATE 50 MG PO TABS
100.0000 mg | ORAL_TABLET | Freq: Two times a day (BID) | ORAL | Status: DC
  Administered 2019-09-23 – 2019-09-24 (×2): 100 mg via ORAL
  Filled 2019-09-23 (×2): qty 2

## 2019-09-23 MED ORDER — SODIUM CHLORIDE 0.9% FLUSH: 3.0000 mL | INTRAVENOUS | Status: DC | PRN

## 2019-09-23 MED ORDER — ALLOPURINOL 300 MG PO TABS
300.0000 mg | ORAL_TABLET | Freq: Every day | ORAL | Status: DC
  Administered 2019-09-24: 300 mg via ORAL
  Filled 2019-09-23 (×3): qty 1

## 2019-09-23 MED ORDER — PRAVASTATIN SODIUM 40 MG PO TABS
40.0000 mg | ORAL_TABLET | Freq: Every day | ORAL | Status: DC
  Filled 2019-09-23 (×2): qty 1

## 2019-09-23 MED ORDER — SODIUM CHLORIDE 0.9 % IV SOLN: 250.0000 mL | INTRAVENOUS | Status: DC | PRN

## 2019-09-23 MED ORDER — INSULIN ASPART 100 UNIT/ML ~~LOC~~ SOLN: 0.0000 [IU] | Freq: Every day | SUBCUTANEOUS | Status: DC

## 2019-09-23 MED ORDER — ENOXAPARIN SODIUM 40 MG/0.4ML ~~LOC~~ SOLN: 40.0000 mg | SUBCUTANEOUS | Status: DC

## 2019-09-23 MED ORDER — ASPIRIN 325 MG PO TABS
325.0000 mg | ORAL_TABLET | Freq: Every day | ORAL | Status: DC
  Administered 2019-09-24: 09:00:00 325 mg via ORAL
  Filled 2019-09-23: qty 1

## 2019-09-23 NOTE — H&P (Signed)
History and Physical    Tim Torres STM:[id number] DOB: 10-17-1951 DOA: 09/23/2019  PCP: Patient, No Pcp Per  Patient coming from: Home  I have personally briefly reviewed patient's old medical records in [place]  Chief Complaint: Dizziness  HPI: Tim Torres is a 68 y.o. male with medical history significant of hypertension, diabetes mellitus type 2, hyperlipidemia, coronary artery disease, gout.  Patient reports he had been to a baseball game today and then started feeling nauseated on the way home at around 230 while he was in the car.  He went home and decided to eat something and then says around 4 PM he tried to stand up and had a sudden episode of dizziness, lightheadedness, feeling that the room is spinning as well as nausea and multiple episodes of vomiting.  EMS was called and when they arrived they noticed that patient had 2 episodes of vomiting.  He says he continued to feel nauseated and had vomiting until he came to the ER and received medications.  He denied any chest pain, shortness of breath, blurring of vision, headache, palpitations, diarrhea, abdominal pain, ataxia, focal deficits. Patient reported back surgery about 6 weeks ago for lumbar degenerative disc disease with sciatica.  He says he still has weakness and numbness of the right lower extremity. ED Course:  Vital Signs reviewed on presentation, significant for temperature 97.8, heart rate 65, blood pressure 160/81, saturation 100% on room air. Labs reviewed, significant for sodium 140, potassium 3.7, BUN 23, creatinine 1.0, hemoglobin 12.6, hematocrit 37, glucose 177, alcohol level is negative. Imaging personally Reviewed, CT of the head shows no acute intracranial abnormalities. EKG personally reviewed, shows sinus rhythm, no acute ST-T changes.  Review of Systems: As per HPI otherwise 10 point review of systems negative.  All other review of systems is negative except the ones noted above in the  HPI.  Past Medical History:  Diagnosis Date  . Diabetes mellitus without complication ([place])   . Hypertension     Past Surgical History:  Procedure Laterality Date  . BACK SURGERY       reports that he has never smoked. He has never used smokeless tobacco. He reports that he does not drink alcohol or use drugs.  Allergies  Allergen Reactions  . Shellfish Allergy Shortness Of Breath and Swelling  . Atorvastatin Other (See Comments)    Myalgia   . Rosuvastatin Other (See Comments)    myalgia    No family history on file. Family history reviewed, noted as above, not pertinent to current presentation.   Prior to Admission medications   Not on File    Physical Exam: Vitals:   09/23/19 2230 09/23/19 2245 09/23/19 2300 09/23/19 2330  BP: 138/81 (!) 150/75 (!) 146/77 (!) 145/85  Pulse: 65 68 64 66  Resp:   18 17  Temp:      TempSrc:      SpO2: 100% 100% 100% 100%  Weight:      Height:        Constitutional: NAD, calm, comfortable Vitals:   09/23/19 2230 09/23/19 2245 09/23/19 2300 09/23/19 2330  BP: 138/81 (!) 150/75 (!) 146/77 (!) 145/85  Pulse: 65 68 64 66  Resp:   18 17  Temp:      TempSrc:      SpO2: 100% 100% 100% 100%  Weight:      Height:       Eyes: PERRL, lids and conjunctivae normal ENMT: Mucous membranes are moist.  Posterior pharynx clear of any exudate or lesions.Normal dentition.  Neck: normal, supple, no masses, no thyromegaly Respiratory: clear to auscultation bilaterally, no wheezing, no crackles. Normal respiratory effort. No accessory muscle use.  Cardiovascular: Regular rate and rhythm, no murmurs / rubs / gallops. No extremity edema. 2+ pedal pulses. No carotid bruits.  Abdomen: no tenderness, no masses palpated. No hepatosplenomegaly. Bowel sounds positive.  Musculoskeletal: no clubbing / cyanosis. No joint deformity upper and lower extremities.  Decreased range of movement on right lower extremity, there is some muscle wasting on right lower  extremity as well. Skin: no rashes, lesions, ulcers. No induration Neurologic: CN 2-12 grossly intact.  Decreased sensation in right lower extremity, power is 4/5 in right lower extremity, normal power in both upper and left lower extremity. Psychiatric: Normal judgment and insight. Alert and oriented x 3. Normal mood.    Decubitus Ulcers: Not present on admission Catheters and tubes: None   Labs on Admission: I have personally reviewed following labs and imaging studies  CBC: Recent Labs  Lab 09/23/19 1735 09/23/19 1806  WBC 8.5  --   NEUTROABS 6.4  --   HGB 13.2 12.6*  HCT 38.5* 37.0*  MCV 93.0  --   PLT 156  --    Basic Metabolic Panel: Recent Labs  Lab 09/23/19 1735 09/23/19 1806  NA 135 140  K 3.5 3.7  CL 106 116*  CO2 19*  --   GLUCOSE 186* 177*  BUN 24* 23  CREATININE 1.08 1.00  CALCIUM 9.3  --    GFR: Estimated Creatinine Clearance: 90.3 mL/min (by C-G formula based on SCr of 1 mg/dL). Liver Function Tests: Recent Labs  Lab 09/23/19 1735  AST 25  ALT 26  ALKPHOS 69  BILITOT 1.0  PROT 7.6  ALBUMIN 4.3   No results for input(s): LIPASE, AMYLASE in the last 168 hours. No results for input(s): AMMONIA in the last 168 hours. Coagulation Profile: Recent Labs  Lab 09/23/19 1735  INR 1.1   Cardiac Enzymes: No results for input(s): CKTOTAL, CKMB, CKMBINDEX, TROPONINI in the last 168 hours. BNP (last 3 results) No results for input(s): PROBNP in the last 8760 hours. HbA1C: No results for input(s): HGBA1C in the last 72 hours. CBG: No results for input(s): GLUCAP in the last 168 hours. Lipid Profile: No results for input(s): CHOL, HDL, LDLCALC, TRIG, CHOLHDL, LDLDIRECT in the last 72 hours. Thyroid Function Tests: No results for input(s): TSH, T4TOTAL, FREET4, T3FREE, THYROIDAB in the last 72 hours. Anemia Panel: No results for input(s): VITAMINB12, FOLATE, FERRITIN, TIBC, IRON, RETICCTPCT in the last 72 hours. Urine analysis:    Component Value  Date/Time   COLORURINE YELLOW 09/05/2008 0839   APPEARANCEUR CLEAR 09/05/2008 0839   LABSPEC 1.027 09/05/2008 0839   PHURINE 5.5 09/05/2008 0839   GLUCOSEU NEGATIVE 09/05/2008 0839   HGBUR NEGATIVE 09/05/2008 0839   BILIRUBINUR NEGATIVE 09/05/2008 0839   KETONESUR NEGATIVE 09/05/2008 0839   PROTEINUR NEGATIVE 09/05/2008 0839   UROBILINOGEN 0.2 09/05/2008 0839   NITRITE NEGATIVE 09/05/2008 0839   LEUKOCYTESUR  09/05/2008 0839    NEGATIVE MICROSCOPIC NOT DONE ON URINES WITH NEGATIVE PROTEIN, BLOOD, LEUKOCYTES, NITRITE, OR GLUCOSE <1000 mg/dL.    Radiological Exams on Admission: Ct Head Wo Contrast  Result Date: 09/23/2019 CLINICAL DATA:  Dizziness EXAM: CT HEAD WITHOUT CONTRAST TECHNIQUE: Contiguous axial images were obtained from the base of the skull through the vertex without intravenous contrast. COMPARISON:  None. FINDINGS: Brain: No evidence of acute infarction, hemorrhage,  hydrocephalus, extra-axial collection or mass lesion/mass effect. Vascular: No hyperdense vessel or unexpected calcification. Skull: Normal. Negative for fracture or focal lesion. Sinuses/Orbits: No acute finding. Other: None. IMPRESSION: No acute intracranial pathology. Electronically Signed   By: Lauralyn PrimesAlex  Bibbey M.D.   On: 09/23/2019 18:45      Assessment/Plan Active Problems:   Dizziness   HTN (hypertension)   HLD (hyperlipidemia)   CAD (coronary artery disease)   DM2 (diabetes mellitus, type 2) (HCC)   DJD (degenerative joint disease)     Principal Problem: Dizziness Patient presented with dizziness, nausea, vomiting of relatively sudden onset.  No ataxia reported.  History seems to be consistent with possible benign positional vertigo.  Patient did have improvement with meclizine administration in the ER.  Other differential diagnosis would include a posterior circulation stroke versus orthostatic hypotension.  Physical exam did show mild nystagmus on right gaze.  No other neurological deficits.   Cerebellar signs are negative. Plan: We will place on telemetry monitoring Serial troponins Orthostatic vital signs Meclizine as needed Will get MRI brain in a.m. to rule out any acute cerebral ischemia.  Other Active Problems:  Coronary artery disease: Continue aspirin  Hypertension: Continue metoprolol  Diabetes mellitus type 2: Hold oral hypoglycemics for now.  We will place on sliding scale insulin coverage fingerstick monitoring.  Hyperlipidemia: Continue lovastatin  Obstructive sleep apnea: Continue CPAP  Osteoarthritis/lumbar degenerative disc disease: History of surgery x2 in the past to herniated thoracic discs.  Patient recently had a back surgery 6 weeks ago for sciatica and low back pain.  [place]surgery in [place].  Is scheduled for an MRI of his lumbar spine on 09/24/2019.  Does have chronic right lower extremity weakness and numbness following the surgery. Continue gabapentin Pain meds as needed  History of gout: Continue allopurinol  DVT prophylaxis: Lovenox Code Status:  Full code Family Communication: N/A  Disposition Plan: We will place under observation, plan for MRI brain in a.m., echocardiogram.  Can discharge if negative. Consults called: N/A Admission status: Observation   Olga CoasterSHARDUL M Aidan Caloca MD [facility]  If 7PM-7AM, please contact night-coverage   09/24/2019, 12:00 AM

## 2019-09-23 NOTE — ED Provider Notes (Signed)
[facility] EMERGENCY DEPARTMENT Provider Note   CSN: [id number] Arrival date & time: 09/23/19  1658     History   Chief Complaint Chief Complaint  Patient presents with   Dizziness   Nausea    HPI Tim Torres is a 68 y.o. male.     Patient with acute onset at 4:00 this afternoon of dizziness with some room spinning and nausea and vomiting.  Dizziness started first.  Patient a few days ago did have a diarrheal illness but had been feeling fine prior to that.  Patient states that the dizziness comes in waves.  Patient had back surgery 6 weeks ago by Dr. Dutch Quint.  He has some persistent pain and numbness predominantly in his right lower extremity.  3 days ago patient had some diarrhea.  But that resolved.  Patient denies any fever cough or shortness of breath.  Denies any visual changes.  Any new weakness any new numbness.  Denies any ringing in his ears or any hearing changes.  Denies headache.     Past Medical History:  Diagnosis Date   Diabetes mellitus without complication (HCC)    Hypertension     Patient Active Problem List   Diagnosis Date Noted   Dizziness 09/23/2019    Past Surgical History:  Procedure Laterality Date   BACK SURGERY          Home Medications    Prior to Admission medications   Not on File    Family History No family history on file.  Social History Social History   Tobacco Use   Smoking status: Never Smoker   Smokeless tobacco: Never Used  Substance Use Topics   Alcohol use: Never    Frequency: Never   Drug use: Never     Allergies   Shellfish allergy   Review of Systems Review of Systems  Constitutional: Negative for chills and fever.  HENT: Negative for congestion, hearing loss, rhinorrhea and sore throat.   Eyes: Negative for visual disturbance.  Respiratory: Negative for cough and shortness of breath.   Cardiovascular: Negative for chest pain and leg swelling.  Gastrointestinal: Positive for  nausea and vomiting. Negative for abdominal pain and diarrhea.  Genitourinary: Negative for dysuria.  Musculoskeletal: Negative for back pain and neck pain.  Skin: Negative for rash.  Neurological: Positive for dizziness and numbness. Negative for light-headedness and headaches.  Hematological: Does not bruise/bleed easily.  Psychiatric/Behavioral: Negative for confusion.     Physical Exam Updated Vital Signs BP (!) 148/69    Pulse 66    Temp 97.8 F (36.6 C) (Oral)    Resp 14    Ht 1.854 m (6\' 1" )    Wt 99.8 kg    SpO2 100%    BMI 29.03 kg/m   Physical Exam Vitals signs and nursing note reviewed.  Constitutional:      General: He is not in acute distress.    Appearance: Normal appearance. He is well-developed. He is not ill-appearing.  HENT:     Head: Normocephalic and atraumatic.  Eyes:     Extraocular Movements: Extraocular movements intact.     Conjunctiva/sclera: Conjunctivae normal.     Pupils: Pupils are equal, round, and reactive to light.  Neck:     Musculoskeletal: Normal range of motion and neck supple.  Cardiovascular:     Rate and Rhythm: Normal rate and regular rhythm.     Heart sounds: No murmur.  Pulmonary:     Effort: Pulmonary effort is normal.  No respiratory distress.     Breath sounds: Normal breath sounds.  Abdominal:     Palpations: Abdomen is soft.     Tenderness: There is no abdominal tenderness.  Musculoskeletal: Normal range of motion.        General: No swelling.     Comments: Wounds healing well  Skin:    General: Skin is warm and dry.     Capillary Refill: Capillary refill takes less than 2 seconds.  Neurological:     General: No focal deficit present.     Mental Status: He is alert and oriented to person, place, and time.     Cranial Nerves: No cranial nerve deficit.     Sensory: Sensory deficit present.     Motor: No weakness.     Coordination: Coordination normal.     Gait: Gait normal.     Comments: Patient has some some persistent  sensory changes and pain associated with his right lower extremity following his back surgery.      ED Treatments / Results  Labs (all labs ordered are listed, but only abnormal results are displayed) Labs Reviewed  CBC - Abnormal; Notable for the following components:      Result Value   RBC 4.14 (*)    HCT 38.5 (*)    All other components within normal limits  COMPREHENSIVE METABOLIC PANEL - Abnormal; Notable for the following components:   CO2 19 (*)    Glucose, Bld 186 (*)    BUN 24 (*)    All other components within normal limits  I-STAT CHEM 8, ED - Abnormal; Notable for the following components:   Chloride 116 (*)    Glucose, Bld 177 (*)    TCO2 19 (*)    Hemoglobin 12.6 (*)    HCT 37.0 (*)    All other components within normal limits  SARS CORONAVIRUS 2 (TAT 6-24 HRS)  ETHANOL  PROTIME-INR  APTT  DIFFERENTIAL  RAPID URINE DRUG SCREEN, HOSP PERFORMED  URINALYSIS, ROUTINE W REFLEX MICROSCOPIC    EKG None   ED ECG REPORT   Date: 09/23/2019  Rate: 70  Rhythm: normal sinus rhythm  QRS Axis: left  Intervals: normal  ST/T Wave abnormalities: normal  Conduction Disutrbances:none  Narrative Interpretation:   Old EKG Reviewed: none available  I have personally reviewed the EKG tracing and agree with the computerized printout as noted. Borderline left axis deviation.  Radiology Ct Head Wo Contrast  Result Date: 09/23/2019 CLINICAL DATA:  Dizziness EXAM: CT HEAD WITHOUT CONTRAST TECHNIQUE: Contiguous axial images were obtained from the base of the skull through the vertex without intravenous contrast. COMPARISON:  None. FINDINGS: Brain: No evidence of acute infarction, hemorrhage, hydrocephalus, extra-axial collection or mass lesion/mass effect. Vascular: No hyperdense vessel or unexpected calcification. Skull: Normal. Negative for fracture or focal lesion. Sinuses/Orbits: No acute finding. Other: None. IMPRESSION: No acute intracranial pathology. Electronically  Signed   By: Eddie Candle M.D.   On: 09/23/2019 18:45    Procedures Procedures (including critical care time)  Medications Ordered in ED Medications  meclizine (ANTIVERT) tablet 25 mg (25 mg Oral Given 09/23/19 1742)     Initial Impression / Assessment and Plan / ED Course  I have reviewed the triage vital signs and the nursing notes.  Pertinent labs & imaging results that were available during my care of the patient were reviewed by me and considered in my medical decision making (see chart for details).       Patient  with acute onset of dizziness a little bit of vertigo and nausea and vomiting.  Patient was feeling fine earlier today everything happened suddenly at around 4:00 this afternoon.  Not associated with headache.  No speech problems no visual changes no weakness or any significant numbness in the extremities or face area.  Head CT without evidence of any acute abnormality.  Patient improved with Zofran and meclizine.  But the concern would be for possible stroke as the cause of the dizziness.  Patient will be admitted by the hospitalist for MRI in the morning.  Patient has some chronic back pain following L4-L5 laminectomy and some sort of cyst removed from the sciatic nerve.  He had MRI planned by Dr. Dutch QuintPoole to evaluate this further later tomorrow.  Is resulted in some somewhat chronic sensory changes and pain shooting into the right lower extremity.  These are not any different or worse. Final Clinical Impressions(s) / ED Diagnoses   Final diagnoses:  Dizziness    ED Discharge Orders    None       Vanetta MuldersZackowski, Billy Turvey, MD 09/23/19 2119

## 2019-09-23 NOTE — ED Notes (Signed)
Pt was informed that we need a urine sample. Urinal at bedside. 

## 2019-09-23 NOTE — ED Triage Notes (Signed)
Pt reports he had been to a baseball game and started feeling nauseated on the way home.  Reports he got home and ate something and around 4am he stood up and had to sit back down because he was so dizzy.  EMS arrived, pt vomited twice with ems.  EMS started IV and gave him 4 mg zofran.  Pt denies other stroke symptoms.  Pt says he had back surgery 6 weeks ago and since has had pain and numbness in legs.  CBG with ems was 151, hr 70, bp 180/86 and temp 98.6.  Pt denies diarrhea or abd pain today.  Reports had 10-12 episodes of diarrhea Thursday after eating nabs and chocolate milk but no diarrhea since then.  Denies cough, fever, sob.

## 2019-09-24 ENCOUNTER — Observation Stay (HOSPITAL_COMMUNITY): Payer: Medicare PPO

## 2019-09-24 ENCOUNTER — Other Ambulatory Visit: Payer: Medicare PPO

## 2019-09-24 DIAGNOSIS — R42 Dizziness and giddiness: Secondary | ICD-10-CM

## 2019-09-24 DIAGNOSIS — M1990 Unspecified osteoarthritis, unspecified site: Secondary | ICD-10-CM

## 2019-09-24 LAB — BASIC METABOLIC PANEL
Anion gap: 7 (ref 5–15)
BUN: 20 mg/dL (ref 8–23)
CO2: 22 mmol/L (ref 22–32)
Calcium: 9 mg/dL (ref 8.9–10.3)
Chloride: 109 mmol/L (ref 98–111)
Creatinine, Ser: 1.15 mg/dL (ref 0.61–1.24)
GFR calc Af Amer: >60 mL/min (ref >60)
GFR calc non Af Amer: >60 mL/min (ref >60)
Glucose, Bld: 131 mg/dL — ABNORMAL HIGH (ref 70–99)
Potassium: 4.1 mmol/L (ref 3.5–5.1)
Sodium: 138 mmol/L (ref 135–145)

## 2019-09-24 LAB — CBC
HCT: 44.8 % (ref 39.0–52.0)
Hemoglobin: 14.1 g/dL (ref 13.0–17.0)
MCH: 31.8 pg (ref 26.0–34.0)
MCHC: 31.5 g/dL (ref 30.0–36.0)
MCV: 101.1 fL — ABNORMAL HIGH (ref 80.0–100.0)
Platelets: 108 10*3/uL — ABNORMAL LOW (ref 150–400)
RBC: 4.43 MIL/uL (ref 4.22–5.81)
RDW: 13.8 % (ref 11.5–15.5)
WBC: 8.5 10*3/uL (ref 4.0–10.5)

## 2019-09-24 LAB — TROPONIN I (HIGH SENSITIVITY)
Troponin I (High Sensitivity): 4 ng/L (ref <18)
Troponin I (High Sensitivity): 4 ng/L (ref <18)
Troponin I (High Sensitivity): 5 ng/L (ref <18)

## 2019-09-24 LAB — CBG MONITORING, ED
Glucose-Capillary: 110 mg/dL — ABNORMAL HIGH (ref 70–99)
Glucose-Capillary: 140 mg/dL — ABNORMAL HIGH (ref 70–99)

## 2019-09-24 LAB — SARS CORONAVIRUS 2 (TAT 6-24 HRS): SARS Coronavirus 2: NEGATIVE

## 2019-09-24 LAB — HEMOGLOBIN A1C
Hgb A1c MFr Bld: 5.7 % — ABNORMAL HIGH (ref 4.8–5.6)
Mean Plasma Glucose: 116.89 mg/dL

## 2019-09-24 LAB — HIV ANTIBODY (ROUTINE TESTING W REFLEX): HIV Screen 4th Generation wRfx: NONREACTIVE

## 2019-09-24 MED ORDER — PRAVASTATIN SODIUM 40 MG PO TABS: 40.0000 mg | ORAL_TABLET | Freq: Two times a day (BID) | ORAL | Status: DC

## 2019-09-24 MED ORDER — ONDANSETRON HCL 4 MG PO TABS: 4.0000 mg | ORAL_TABLET | Freq: Four times a day (QID) | ORAL | 0 refills | Status: DC | PRN

## 2019-09-24 MED ORDER — GADOBUTROL 1 MMOL/ML IV SOLN
7.0000 mL | Freq: Once | INTRAVENOUS | Status: AC | PRN
  Administered 2019-09-24: 08:00:00 7 mL via INTRAVENOUS

## 2019-09-24 MED ORDER — MECLIZINE HCL 25 MG PO TABS: 25.0000 mg | ORAL_TABLET | Freq: Three times a day (TID) | ORAL | 0 refills | Status: DC | PRN

## 2019-09-24 MED ORDER — LORAZEPAM 2 MG/ML IJ SOLN: 1.0000 mg | Freq: Once | INTRAMUSCULAR | Status: DC

## 2019-09-24 NOTE — ED Notes (Signed)
Patient transported to MRI 

## 2019-09-24 NOTE — Discharge Summary (Signed)
Physician Discharge Summary  Tim HarmsDouglas H Torres ZOX:[id number] DOB: Oct 02, 1951 DOA: 09/23/2019  PCP: Patient, No Pcp Per  Admit date: 09/23/2019  Discharge date: 09/24/2019  Admitted From:Home  Disposition:  Home  Recommendations for Outpatient Follow-up:  1. Follow up with PCP in 1-2 weeks 2. Follow-up with neurosurgery as scheduled this upcoming Wednesday to review lumbar MRI 3. Continue meclizine as prescribed for any dizziness.  Patient currently asymptomatic 4. Zofran as needed for nausea or vomiting 5. Continue home medications as prior with Janumet to be held until 11/10  Home Health: None  Equipment/Devices: None  Discharge Condition: Stable  CODE STATUS: Full  Diet recommendation: Heart Healthy/carb modified  Brief/Interim Summary: Per HPI: Tim Torres is a 68 y.o. male with medical history significant of hypertension, diabetes mellitus type 2, hyperlipidemia, coronary artery disease, gout.  Patient reports he had been to a baseball game today and then started feeling nauseated on the way home at around 230 while he was in the car.  He went home and decided to eat something and then says around 4 PM he tried to stand up and had a sudden episode of dizziness, lightheadedness, feeling that the room is spinning as well as nausea and multiple episodes of vomiting.  EMS was called and when they arrived they noticed that patient had 2 episodes of vomiting.  He says he continued to feel nauseated and had vomiting until he came to the ER and received medications.  He denied any chest pain, shortness of breath, blurring of vision, headache, palpitations, diarrhea, abdominal pain, ataxia, focal deficits. Patient reported back surgery about 6 weeks ago for lumbar degenerative disc disease with sciatica.  He says he still has weakness and numbness of the right lower extremity.  Patient was admitted with dizziness and had brain MRI to rule out any acute cerebral ischemia.  His brain  MRI does not show any acute abnormalities and also lumbar MRI without any acute findings related to his recent surgery 6 weeks prior.  He will have close follow-up to neurosurgery in the next 2 to 3 days to review the lumbar MRI films.  Given the fact that he has not had a CVA and has responded quite well to meclizine with no further symptoms noted it does appear that he has BPPV possibly related to some sinus congestion that was also noted on MRI.  He does not appear to have acute sinusitis.  He has been prescribed meclizine and Zofran as needed and is stable for discharge at this time.  No other acute events noted during this brief hospitalization.  Discharge Diagnoses:  Active Problems:   Dizziness   HTN (hypertension)   HLD (hyperlipidemia)   CAD (coronary artery disease)   DM2 (diabetes mellitus, type 2) (HCC)   DJD (degenerative joint disease)  Principal discharge diagnosis: Dizziness with nausea and vomiting secondary to BPPV.  Discharge Instructions  Discharge Instructions    Diet - low sodium heart healthy   Complete by: As directed    Increase activity slowly   Complete by: As directed      Allergies as of 09/24/2019      Reactions   Shellfish Allergy Shortness Of Breath, Swelling   Atorvastatin Other (See Comments)   Myalgia    Rosuvastatin Other (See Comments)   myalgia      Medication List    TAKE these medications   allopurinol 300 MG tablet Commonly known as: ZYLOPRIM Take 300 mg by mouth daily.   aspirin  325 MG tablet Take 325 mg by mouth daily.   gabapentin 300 MG capsule Commonly known as: NEURONTIN Take 300 mg by mouth 3 (three) times daily.   HYDROcodone-acetaminophen 5-325 MG tablet Commonly known as: NORCO/VICODIN Take 0.5-1 tablets by mouth every 6 (six) hours as needed for moderate pain or severe pain.   Janumet XR 50-1000 MG Tb24 Generic drug: SitaGLIPtin-MetFORMIN HCl Take 2 tablets by mouth daily.   lovastatin 40 MG tablet Commonly known  as: MEVACOR Take 40 mg by mouth 2 (two) times daily.   meclizine 25 MG tablet Commonly known as: ANTIVERT Take 1 tablet (25 mg total) by mouth 3 (three) times daily as needed for dizziness or nausea.   metoprolol tartrate 50 MG tablet Commonly known as: LOPRESSOR Take 100 mg by mouth 2 (two) times daily.   ondansetron 4 MG tablet Commonly known as: ZOFRAN Take 1 tablet (4 mg total) by mouth every 6 (six) hours as needed for nausea.   zolpidem 5 MG tablet Commonly known as: AMBIEN Take 5 mg by mouth at bedtime.      Follow-up Information    pcp Follow up in 1 week(s).          Allergies  Allergen Reactions  . Shellfish Allergy Shortness Of Breath and Swelling  . Atorvastatin Other (See Comments)    Myalgia   . Rosuvastatin Other (See Comments)    myalgia    Consultations:  None   Procedures/Studies: Ct Head Wo Contrast  Result Date: 09/23/2019 CLINICAL DATA:  Dizziness EXAM: CT HEAD WITHOUT CONTRAST TECHNIQUE: Contiguous axial images were obtained from the base of the skull through the vertex without intravenous contrast. COMPARISON:  None. FINDINGS: Brain: No evidence of acute infarction, hemorrhage, hydrocephalus, extra-axial collection or mass lesion/mass effect. Vascular: No hyperdense vessel or unexpected calcification. Skull: Normal. Negative for fracture or focal lesion. Sinuses/Orbits: No acute finding. Other: None. IMPRESSION: No acute intracranial pathology. Electronically Signed   By: Lauralyn Primes M.D.   On: 09/23/2019 18:45   Mr Angio Head Wo Contrast  Result Date: 09/24/2019 CLINICAL DATA:  68 year old male with episode of nausea, syncope, dizziness. EXAM: MRA HEAD WITHOUT CONTRAST TECHNIQUE: Angiographic images of the Circle of Willis were obtained using MRA technique without intravenous contrast. COMPARISON:  Brain MRI today reported separately. FINDINGS: No intracranial mass effect or ventriculomegaly. Intermittent mild motion artifact. Antegrade flow in  the posterior circulation. Fairly codominant distal vertebral arteries with no stenosis to the basilar. Mild motion artifact. Both PICA origins are patent. Patent basilar artery without stenosis. Normal SCA and PCA origins. Posterior communicating arteries are diminutive or absent. Bilateral PCA branches are within normal limits. Antegrade flow in both ICA siphons. Tortuous cervical right ICA just below the skull base. No siphon stenosis. Normal ophthalmic artery origins. Patent carotid termini. Normal MCA and ACA origins. The left ACA is dominant throughout, simulating azygos type ACA anatomy (normal variant). There is a diminutive right A1 segment. Visible ACA branches are within normal limits. The left MCA bifurcates early without stenosis. Visible left MCA branches are within normal limits allowing for mild motion. Right MCA M1 and bifurcation are within normal limits. Visible right MCA branches are within normal limits allowing for mild motion. IMPRESSION: Normal intracranial MRA allowing for mild motion artifact. Electronically Signed   By: Odessa Fleming M.D.   On: 09/24/2019 08:50   Mr Brain Wo Contrast  Result Date: 09/24/2019 CLINICAL DATA:  68 year old male with episode of nausea, syncope, dizziness. EXAM: MRI HEAD WITHOUT  CONTRAST TECHNIQUE: Multiplanar, multiecho pulse sequences of the brain and surrounding structures were obtained without intravenous contrast. COMPARISON:  Head CT without contrast 09/23/2019. FINDINGS: Brain: No restricted diffusion to suggest acute infarction. No midline shift, mass effect, evidence of mass lesion, ventriculomegaly, extra-axial collection or acute intracranial hemorrhage. Cervicomedullary junction and pituitary are within normal limits. No chronic cerebral blood products identified. No cortical encephalomalacia. Wallace Cullens and white matter signal is within normal limits for age throughout the brain. Vascular: Major intracranial vascular flow voids are preserved. Skull and  upper cervical spine: Negative visible cervical spine. Visualized bone marrow signal is within normal limits. Sinuses/Orbits: Negative orbits. Moderate posterior ethmoid and right sphenoid sinus mucosal thickening. Other: Trace mastoid fluid on the right, appears inconsequential. Left mastoids are clear. Negative visible nasopharynx. Visible internal auditory structures appear normal. Scalp and face soft tissues appear negative. IMPRESSION: 1. No acute intracranial abnormality. Normal for age noncontrast MRI appearance of the brain. 2. Mild to moderate ethmoid and sphenoid sinus inflammation. Electronically Signed   By: Odessa Fleming M.D.   On: 09/24/2019 09:04   Mr Lumbar Spine W Wo Contrast  Result Date: 09/24/2019 CLINICAL DATA:  Pain and numbness in the leg since back surgery on 08/08/2019. EXAM: MRI LUMBAR SPINE WITHOUT AND WITH CONTRAST TECHNIQUE: Multiplanar and multiecho pulse sequences of the lumbar spine were obtained without and with intravenous contrast. CONTRAST:  21mL GADAVIST GADOBUTROL 1 MMOL/ML IV SOLN COMPARISON:  MRI dated 07/10/2019 FINDINGS: Segmentation:  Standard. Alignment:  Physiologic. Vertebrae:  No fracture, evidence of discitis, or bone lesion. Conus medullaris and cauda equina: Conus extends to the L1-2 level. Conus and cauda equina appear normal. Paraspinal and other soft tissues: No significant abnormality. Expected postsurgical changes at L4-5 from recent surgery. Disc levels: L1-2: Disc desiccation with a tiny broad-based disc bulge with no neural impingement. Slight degenerative changes of the facet joints, stable. L2-3: Small broad-based disc bulge asymmetric to the left slightly compressing the left lateral recess. Slight hypertrophy of the ligamentum flavum and facet joints. The appearance is unchanged. No focal neural impingement. Mild spinal stenosis, unchanged. L3-4: No significant abnormalities. No change. L4-5: Interval surgery on the right and left. The disc extrusions have  been removed. There is slight enhancing scarring around the left side of the thecal sac and in the right neural foramen to the expected degree. No residual neural impingement. Slight edema and enhancement of the posterior soft tissues to the expected degree. L5-S1: Circumferential disc bulge with a small annular tear central and to the left without neural impingement. No significant change since the prior study. Moderate right foraminal stenosis, stable. However, the right L5 nerve appears to exit without impingement. IMPRESSION: 1. Postsurgical changes at L4-5 on the right and left with no residual neural impingement. 2. Chronic degenerative disc disease at L2-3 and L5-S1 without focal neural impingement. 3. Moderate right foraminal stenosis at L5-S1, stable. Electronically Signed   By: Francene Boyers M.D.   On: 09/24/2019 09:12     Discharge Exam: Vitals:   09/24/19 0918 09/24/19 0930  BP: (!) 147/71 (!) 146/75  Pulse: 66 68  Resp: 16 (!) 25  Temp:    SpO2: 100% 100%   Vitals:   09/24/19 0700 09/24/19 0730 09/24/19 0918 09/24/19 0930  BP: (!) 145/83 (!) 141/95 (!) 147/71 (!) 146/75  Pulse: 65 (!) 59 66 68  Resp:   16 (!) 25  Temp:      TempSrc:      SpO2: 100% 99% 100%  100%  Weight:      Height:        General: Pt is alert, awake, not in acute distress Cardiovascular: RRR, S1/S2 +, no rubs, no gallops Respiratory: CTA bilaterally, no wheezing, no rhonchi Abdominal: Soft, NT, ND, bowel sounds + Extremities: no edema, no cyanosis    The results of significant diagnostics from this hospitalization (including imaging, microbiology, ancillary and laboratory) are listed below for reference.     Microbiology: No results found for this or any previous visit (from the past 240 hour(s)).   Labs: BNP (last 3 results) No results for input(s): BNP in the last 8760 hours. Basic Metabolic Panel: Recent Labs  Lab 09/23/19 1735 09/23/19 1806 09/24/19 0254  NA 135 140 138  K 3.5 3.7  4.1  CL 106 116* 109  CO2 19*  --  22  GLUCOSE 186* 177* 131*  BUN 24* 23 20  CREATININE 1.08 1.00 1.15  CALCIUM 9.3  --  9.0   Liver Function Tests: Recent Labs  Lab 09/23/19 1735  AST 25  ALT 26  ALKPHOS 69  BILITOT 1.0  PROT 7.6  ALBUMIN 4.3   No results for input(s): LIPASE, AMYLASE in the last 168 hours. No results for input(s): AMMONIA in the last 168 hours. CBC: Recent Labs  Lab 09/23/19 1735 09/23/19 1806 09/24/19 0254  WBC 8.5  --  8.5  NEUTROABS 6.4  --   --   HGB 13.2 12.6* 14.1  HCT 38.5* 37.0* 44.8  MCV 93.0  --  101.1*  PLT 156  --  108*   Cardiac Enzymes: No results for input(s): CKTOTAL, CKMB, CKMBINDEX, TROPONINI in the last 168 hours. BNP: Invalid input(s): POCBNP CBG: Recent Labs  Lab 09/24/19 0020 09/24/19 0848  GLUCAP 110* 140*   D-Dimer No results for input(s): DDIMER in the last 72 hours. Hgb A1c No results for input(s): HGBA1C in the last 72 hours. Lipid Profile No results for input(s): CHOL, HDL, LDLCALC, TRIG, CHOLHDL, LDLDIRECT in the last 72 hours. Thyroid function studies No results for input(s): TSH, T4TOTAL, T3FREE, THYROIDAB in the last 72 hours.  Invalid input(s): FREET3 Anemia work up No results for input(s): VITAMINB12, FOLATE, FERRITIN, TIBC, IRON, RETICCTPCT in the last 72 hours. Urinalysis    Component Value Date/Time   COLORURINE YELLOW 09/05/2008 [place] 09/05/2008 0839   LABSPEC 1.027 09/05/2008 0839   PHURINE 5.5 09/05/2008 0839   GLUCOSEU NEGATIVE 09/05/2008 0839   HGBUR NEGATIVE 09/05/2008 0839   BILIRUBINUR NEGATIVE 09/05/2008 0839   KETONESUR NEGATIVE 09/05/2008 0839   PROTEINUR NEGATIVE 09/05/2008 0839   UROBILINOGEN 0.2 09/05/2008 0839   NITRITE NEGATIVE 09/05/2008 0839   LEUKOCYTESUR  09/05/2008 0839    NEGATIVE MICROSCOPIC NOT DONE ON URINES WITH NEGATIVE PROTEIN, BLOOD, LEUKOCYTES, NITRITE, OR GLUCOSE <1000 mg/dL.   Sepsis Labs Invalid input(s): PROCALCITONIN,  WBC,   LACTICIDVEN Microbiology No results found for this or any previous visit (from the past 240 hour(s)).   Time coordinating discharge: 35 minutes  SIGNED:   Rodena Goldmann, DO [facility] 09/24/2019, 10:34 AM  If 7PM-7AM, please contact night-coverage [email or web address]

## 2019-10-01 ENCOUNTER — Other Ambulatory Visit: Payer: Medicare PPO

## 2019-10-01 ENCOUNTER — Other Ambulatory Visit: Payer: Self-pay

## 2019-10-01 ENCOUNTER — Ambulatory Visit: Payer: Medicare PPO | Attending: Neurosurgery | Admitting: Physical Therapy

## 2019-10-01 DIAGNOSIS — M6281 Muscle weakness (generalized): Secondary | ICD-10-CM | POA: Diagnosis not present

## 2019-10-01 NOTE — Therapy (Signed)
[facility] [address] [place], [place], [address] Phone: [phone]   Fax:  [phone]  Physical Therapy Evaluation  Patient Details  Name: Tim Torres MRN: [id number] Date of Birth: [birth year] Referring Provider (PT): Julio Sicks MD.   Encounter Date: 10/01/2019  PT End of Session - 10/01/19 1429    Visit Number  1    Number of Visits  12    Date for PT Re-Evaluation  11/12/19    Authorization Type  FOTO AT LEAST EVERY 5TH VISIT.  PROGRESS NOTE AT 10TH VISIT.    PT Start Time  0100    PT Stop Time  0151    PT Time Calculation (min)  51 min    Activity Tolerance  Patient tolerated treatment well    Behavior During Therapy  WFL for tasks assessed/performed       Past Medical History:  Diagnosis Date  . Diabetes mellitus without complication (HCC)   . Hypertension     Past Surgical History:  Procedure Laterality Date  . BACK SURGERY      There were no vitals filed for this visit.   Subjective Assessment - 10/01/19 1517    Pertinent History  HTN, DM, 2 previous spinal surgeries.         [facility] PT Assessment - 10/01/19 0001      Assessment   Medical Diagnosis  Radiculopathy, Lumbar.    Referring Provider (PT)  Julio Sicks MD.    Onset Date/Surgical Date  --   08/08/19(surgery date).     Precautions   Precautions  Fall      Restrictions   Weight Bearing Restrictions  No      Balance Screen   Has the patient fallen in the past 6 months  Yes    How many times?  --   3.   Has the patient had a decrease in activity level because of a fear of falling?   Yes    Is the patient reluctant to leave their home because of a fear of falling?   No      Home Public house manager residence      Prior Function   Level of Independence  Independent      Posture/Postural Control   Posture/Postural Control  No significant limitations      Deep Tendon Reflexes   DTR Assessment Site   Patella;Achilles    Patella DTR  2+    Achilles DTR  2+      ROM / Strength   AROM / PROM / Strength  AROM;Strength      AROM   Overall AROM Comments  WNL for bilateral hip flexion and knee range of motion.  Active right dorsiflexion to less than neutral position.      Strength   Overall Strength Comments  Right knee extension and flexion= 4-/5.  Right dorsiflexion= 1+ to 2-/5.  Left quads and dorsiflexion= 4/5.      Palpation   Palpation comment  Minimal at most palpable pain on either side of lumbar incisional site which appears to be healing well.      Transfers   Transfers  --   Sit to stand with armrests.     Ambulation/Gait   Gait Comments  Gait with staright cane with a steppage type of gait.                Objective measurements completed on examination: See above findings.  [facility] Adult PT Treatment/Exercise - 10/01/19 0001      Modalities   Modalities  Electrical Stimulation;Moist Heat      Moist Heat Therapy   Number Minutes Moist Heat  20 Minutes    Moist Heat Location  Lumbar Spine      Electrical Stimulation   Electrical Stimulation Location  Bilateral LB.    Electrical Stimulation Action  Pre-mod.    Electrical Stimulation Parameters  80-150 Hz x x 20 minutes.    Electrical Stimulation Goals  Pain                  PT Long Term Goals - 10/01/19 1521      PT LONG TERM GOAL #1   Title  Independent with a HEP.    Time  6    Period  Weeks    Status  New      PT LONG TERM GOAL #2   Title  Increase right ankle dorsiflexion to 6- 8 degrees to normalize the patient's gait pattern.    Time  6    Period  Weeks    Status  New      PT LONG TERM GOAL #3   Title  Right LE strength 4+/5.    Time  6    Period  Weeks    Status  New      PT LONG TERM GOAL #4   Title  Walk 250 feet with normal dorsiflexion.    Time  6    Period  Weeks    Status  New      PT LONG TERM GOAL #5   Title  Perform a reciprocating stair gait with one  railing.    Time  6    Period  Weeks    Status  New             Plan - 10/01/19 1514    Clinical Impression Statement  The patient presents to OPPT s/p L4-5 lumbar microdiscectomy performed on 08/08/19.  He states that surgery has been helpful to decrease his LBP.  His CC is pain into his right LE (occasionally on the left) and weakness.  His sleep is disturbed due to pain.  He is most notably weak into right ankle dorsiflexion.  He is using a straight cane for ambulation.  Patient will benefit from skilled physical therapy intervention to address deficits and pain.    Personal Factors and Comorbidities  Comorbidity 1;Comorbidity 2    Comorbidities  HTN, DM, 2 previous spinal surgeries.    Examination-Activity Limitations  Locomotion Level;Other;Sleep    Examination-Participation Restrictions  Other    Stability/Clinical Decision Making  Evolving/Moderate complexity    Clinical Decision Making  Moderate    Rehab Potential  Good    PT Frequency  2x / week    PT Duration  6 weeks    PT Treatment/Interventions  ADLs/Self Care Home Management;Cryotherapy;Electrical Stimulation;Gait training;Ultrasound;Moist Heat;Stair training;Functional mobility training;Therapeutic activities;Therapeutic exercise;Neuromuscular re-education;Patient/family education;Passive range of motion    PT Next Visit Plan  Core exercise progression.  Please work on LE strengthening especially right dorsiflexion.  Modalities as needed.    Consulted and Agree with Plan of Care  Patient       Patient will benefit from skilled therapeutic intervention in order to improve the following deficits and impairments:  Pain, Decreased activity tolerance, Abnormal gait, Decreased strength  Visit Diagnosis: Muscle weakness (generalized) - Plan: PT plan of care cert/re-cert     Problem  List Patient Active Problem List   Diagnosis Date Noted  . DJD (degenerative joint disease) 09/24/2019  . Dizziness 09/23/2019  . HTN  (hypertension) 09/23/2019  . HLD (hyperlipidemia) 09/23/2019  . CAD (coronary artery disease) 09/23/2019  . DM2 (diabetes mellitus, type 2) (HCC) 09/23/2019    Marck Mcclenny, [place] MPT 10/01/2019, 3:27 PM  [facility] [address] [place], [place], [address] Phone: [phone]   Fax:  [phone]  Name: Tim Torres MRN: [id number] Date of Birth: 15-May-1951

## 2019-10-04 ENCOUNTER — Encounter: Payer: Self-pay | Admitting: Physical Therapy

## 2019-10-04 ENCOUNTER — Ambulatory Visit: Payer: Medicare PPO | Admitting: Physical Therapy

## 2019-10-04 ENCOUNTER — Other Ambulatory Visit: Payer: Self-pay

## 2019-10-04 DIAGNOSIS — M6281 Muscle weakness (generalized): Secondary | ICD-10-CM

## 2019-10-04 NOTE — Therapy (Signed)
[facility] [address] [place], [place], [address] Phone: [phone]   Fax:  [phone]  Physical Therapy Treatment  Patient Details  Name: Tim Torres MRN: [id number] Date of Birth: 05/01/1951 Referring Provider (PT): Julio Sicks MD.   Encounter Date: 10/04/2019  PT End of Session - 10/04/19 1354    Visit Number  2    Number of Visits  12    Date for PT Re-Evaluation  11/12/19    Authorization Type  FOTO AT LEAST EVERY 5TH VISIT.  PROGRESS NOTE AT 10TH VISIT.    PT Start Time  0110    PT Stop Time  0209    PT Time Calculation (min)  59 min    Activity Tolerance  Patient tolerated treatment well    Behavior During Therapy  [facility] for tasks assessed/performed       Past Medical History:  Diagnosis Date  . Diabetes mellitus without complication (HCC)   . Hypertension     Past Surgical History:  Procedure Laterality Date  . BACK SURGERY      There were no vitals filed for this visit.  Subjective Assessment - 10/04/19 1312    Subjective  COVID-19 screen performed prior to patient entering clinic.  a little stiff today    Pertinent History  HTN, DM, 2 previous spinal surgeries.    How long can you walk comfortably?  Short community distances.    Diagnostic tests  MRI.    Patient Stated Goals  Be outdoors.  Hunt.    Currently in Pain?  Yes    Pain Score  4     Pain Location  Leg    Pain Orientation  Right    Pain Descriptors / Indicators  Discomfort;Aching;Tightness    Pain Type  Surgical pain    Pain Onset  More than a month ago    Pain Frequency  Constant    Aggravating Factors   standing    Pain Relieving Factors  at rest                       [facility] Adult PT Treatment/Exercise - 10/04/19 0001      Exercises   Exercises  Knee/Hip;Lumbar      Lumbar Exercises: Supine   Ab Set  10 reps;3 seconds    Glut Set  3 seconds;10 reps    Clam  20 reps;3 seconds    Bent Knee Raise  3 seconds   2x10    Straight Leg Raise  3 seconds   left LE only     Knee/Hip Exercises: Seated   Long Arc Quad  Strengthening;Both;2 sets;10 reps;Weights    Long Arc Quad Weight  2 lbs.    Marching  Strengthening;Both;10 reps;1 set;Weights    Marching Limitations  2    Hamstring Curl  Strengthening;Both;20 reps    Hamstring Limitations  red t-band      Moist Heat Therapy   Number Minutes Moist Heat  20 Minutes    Moist Heat Location  Lumbar Spine      Electrical Stimulation   Electrical Stimulation Location  Bilateral LB.    Electrical Stimulation Action  premod    Electrical Stimulation Parameters  80-150hz  x56min    Electrical Stimulation Goals  Pain      Manual Therapy   Manual Therapy  Soft tissue mobilization    Manual therapy comments  manual STW to ant tib on right LE to decrease pain  and tone                  PT Long Term Goals - 10/01/19 1521      PT LONG TERM GOAL #1   Title  Independent with a HEP.    Time  6    Period  Weeks    Status  New      PT LONG TERM GOAL #2   Title  Increase right ankle dorsiflexion to 6- 8 degrees to normalize the patient's gait pattern.    Time  6    Period  Weeks    Status  New      PT LONG TERM GOAL #3   Title  Right LE strength 4+/5.    Time  6    Period  Weeks    Status  New      PT LONG TERM GOAL #4   Title  Walk 250 feet with normal dorsiflexion.    Time  6    Period  Weeks    Status  New      PT LONG TERM GOAL #5   Title  Perform a reciprocating stair gait with one railing.    Time  6    Period  Weeks    Status  New            Plan - 10/04/19 1357    Clinical Impression Statement  Patient tolerated treatment fair due to limitations with right LE. Patient feels a lot of tightness in LE esp on tib ant muscle. Today focused on low level strengthening and core activation to progress patient as tolerated. Patient current goals ongoing at this time. Will progress slowly with Right LE and monitor for progression.     Personal Factors and Comorbidities  Comorbidity 1;Comorbidity 2    Comorbidities  HTN, DM, 2 previous spinal surgeries.    Examination-Participation Restrictions  Other    Stability/Clinical Decision Making  Evolving/Moderate complexity    Rehab Potential  Good    PT Frequency  2x / week    PT Treatment/Interventions  ADLs/Self Care Home Management;Cryotherapy;Electrical Stimulation;Gait training;Ultrasound;Moist Heat;Stair training;Functional mobility training;Therapeutic activities;Therapeutic exercise;Neuromuscular re-education;Patient/family education;Passive range of motion    PT Next Visit Plan  Core exercise progression.  Please work on LE strengthening especially right dorsiflexion.  Modalities as needed.    Consulted and Agree with Plan of Care  Patient       Patient will benefit from skilled therapeutic intervention in order to improve the following deficits and impairments:  Pain, Decreased activity tolerance, Abnormal gait, Decreased strength  Visit Diagnosis: Muscle weakness (generalized)     Problem List Patient Active Problem List   Diagnosis Date Noted  . DJD (degenerative joint disease) 09/24/2019  . Dizziness 09/23/2019  . HTN (hypertension) 09/23/2019  . HLD (hyperlipidemia) 09/23/2019  . CAD (coronary artery disease) 09/23/2019  . DM2 (diabetes mellitus, type 2) ([place]) 09/23/2019    Ladean Raya, PTA 10/04/19 2:13 PM  [facility] [place], [place], [address] Phone: [phone]   Fax:  [phone]  Name: Tim Torres MRN: [id number] Date of Birth: August 19, 1951

## 2019-10-08 ENCOUNTER — Ambulatory Visit: Payer: Medicare PPO | Admitting: Physical Therapy

## 2019-10-08 ENCOUNTER — Other Ambulatory Visit: Payer: Self-pay

## 2019-10-08 DIAGNOSIS — M6281 Muscle weakness (generalized): Secondary | ICD-10-CM

## 2019-10-08 NOTE — Therapy (Signed)
[facility] [address] [place], [place], [address] Phone: [phone]   Fax:  [phone]  Physical Therapy Treatment  Patient Details  Name: Tim Torres MRN: [id number] Date of Birth: 09-24-1951 Referring Provider (PT): Julio Sicks MD.   Encounter Date: 10/08/2019  PT End of Session - 10/08/19 1338    Visit Number  3    Number of Visits  12    Date for PT Re-Evaluation  11/12/19    Authorization Type  FOTO AT LEAST EVERY 5TH VISIT.  PROGRESS NOTE AT 10TH VISIT.    PT Start Time  1250    Activity Tolerance  Patient tolerated treatment well    Behavior During Therapy  [facility] for tasks assessed/performed       Past Medical History:  Diagnosis Date  . Diabetes mellitus without complication (HCC)   . Hypertension     Past Surgical History:  Procedure Laterality Date  . BACK SURGERY      There were no vitals filed for this visit.  Subjective Assessment - 10/08/19 1309    Subjective  COVID-19 screen performed prior to patient entering clinic.  No new complaints.    Pertinent History  HTN, DM, 2 previous spinal surgeries.    How long can you walk comfortably?  Short community distances.    Diagnostic tests  MRI.    Patient Stated Goals  Be outdoors.  Hunt.    Currently in Pain?  Yes    Pain Score  4     Pain Location  Leg    Pain Orientation  Right    Pain Descriptors / Indicators  Discomfort;Aching;Tightness    Pain Type  Surgical pain    Pain Onset  More than a month ago                       [facility] Adult PT Treatment/Exercise - 10/08/19 0001      Exercises   Exercises  Knee/Hip      Lumbar Exercises: Aerobic   Nustep  Level 3 x 10 minutes.      Knee/Hip Exercises: Standing   Rocker Board Limitations  5 minutes.      Modalities   Modalities  Primary school teacher Stimulation Location  Bilateral LB.    Electrical Stimulation Action  Pre-mod.    Electrical Stimulation Parameters  80-150 Hz x 20 minutes.    Electrical Stimulation Goals  Pain      Manual Therapy   Manual Therapy  Soft tissue mobilization    Manual therapy comments  STW/M x 8 minutes to patient's right tib ant f/b active-assisitive dorsiflexion x 4 minutes.                  PT Long Term Goals - 10/01/19 1521      PT LONG TERM GOAL #1   Title  Independent with a HEP.    Time  6    Period  Weeks    Status  New      PT LONG TERM GOAL #2   Title  Increase right ankle dorsiflexion to 6- 8 degrees to normalize the patient's gait pattern.    Time  6    Period  Weeks    Status  New      PT LONG TERM GOAL #3   Title  Right LE strength 4+/5.    Time  6  Period  Weeks    Status  New      PT LONG TERM GOAL #4   Title  Walk 250 feet with normal dorsiflexion.    Time  6    Period  Weeks    Status  New      PT LONG TERM GOAL #5   Title  Perform a reciprocating stair gait with one railing.    Time  6    Period  Weeks    Status  New            Plan - 10/08/19 1338    Clinical Impression Statement  The patient did well with treatment today.  He did well with Rockerboard though he required cues for standing in erect posture.    Personal Factors and Comorbidities  Comorbidity 1;Comorbidity 2    Comorbidities  HTN, DM, 2 previous spinal surgeries.    Examination-Activity Limitations  Locomotion Level;Other;Sleep    Examination-Participation Restrictions  Other    Stability/Clinical Decision Making  Evolving/Moderate complexity    Rehab Potential  Good    PT Frequency  2x / week    PT Duration  6 weeks    PT Treatment/Interventions  ADLs/Self Care Home Management;Cryotherapy;Electrical Stimulation;Gait training;Ultrasound;Moist Heat;Stair training;Functional mobility training;Therapeutic activities;Therapeutic exercise;Neuromuscular re-education;Patient/family education;Passive range of motion;Manual techniques    PT Next Visit Plan  Core  exercise progression.  Please work on LE strengthening especially right dorsiflexion.  Modalities as needed. Please issue HEP    Consulted and Agree with Plan of Care  Patient       Patient will benefit from skilled therapeutic intervention in order to improve the following deficits and impairments:  Pain, Decreased activity tolerance, Abnormal gait, Decreased strength  Visit Diagnosis: Muscle weakness (generalized)     Problem List Patient Active Problem List   Diagnosis Date Noted  . DJD (degenerative joint disease) 09/24/2019  . Dizziness 09/23/2019  . HTN (hypertension) 09/23/2019  . HLD (hyperlipidemia) 09/23/2019  . CAD (coronary artery disease) 09/23/2019  . DM2 (diabetes mellitus, type 2) ([place]) 09/23/2019    Tim Torres, [place] MPT 10/08/2019, 1:46 PM  [facility] [address] [place], [place], [address] Phone: [phone]   Fax:  [phone]  Name: Tim Torres MRN: [id number] Date of Birth: [birth year]

## 2019-10-10 ENCOUNTER — Other Ambulatory Visit: Payer: Self-pay

## 2019-10-10 ENCOUNTER — Encounter: Payer: Self-pay | Admitting: Physical Therapy

## 2019-10-10 ENCOUNTER — Ambulatory Visit: Payer: Medicare PPO | Admitting: Physical Therapy

## 2019-10-10 DIAGNOSIS — M6281 Muscle weakness (generalized): Secondary | ICD-10-CM | POA: Diagnosis not present

## 2019-10-10 NOTE — Therapy (Signed)
[facility] [place], [place], [address] Phone: [phone]   Fax:  [phone]  Physical Therapy Treatment  Patient Details  Name: Tim Torres MRN: [id number] Date of Birth: Aug 01, 1951 Referring Provider (PT): Tim Larsson MD.   Encounter Date: 10/10/2019  PT End of Session - 10/10/19 1151    Visit Number  4    Number of Visits  12    Date for PT Re-Evaluation  11/12/19    Authorization Type  FOTO AT LEAST EVERY 5TH VISIT.  PROGRESS NOTE AT 10TH VISIT.    PT Start Time  1116    PT Stop Time  1207    PT Time Calculation (min)  51 min    Activity Tolerance  Patient tolerated treatment well    Behavior During Therapy  [facility] for tasks assessed/performed       Past Medical History:  Diagnosis Date  . Diabetes mellitus without complication ([place])   . Hypertension     Past Surgical History:  Procedure Laterality Date  . BACK SURGERY      There were no vitals filed for this visit.  Subjective Assessment - 10/10/19 1119    Subjective  COVID-19 screen performed prior to patient entering clinic. Patient reported doing well after last treatment today yet no lasting improvement    Pertinent History  HTN, DM, 2 previous spinal surgeries.    How long can you walk comfortably?  Short community distances.    Diagnostic tests  MRI.    Patient Stated Goals  Be outdoors.  Hunt.    Currently in Pain?  Yes    Pain Score  5     Pain Location  Leg    Pain Orientation  Right;Left   right more per reported   Pain Descriptors / Indicators  Discomfort;Tightness;Aching    Pain Type  Surgical pain    Pain Onset  More than a month ago    Pain Frequency  Constant    Aggravating Factors   prolong standing    Pain Relieving Factors  at rest or movement                       [facility] Adult PT Treatment/Exercise - 10/10/19 0001      Lumbar Exercises: Aerobic   Nustep  L2 x 60min      Knee/Hip Exercises: Supine   Short Arc Quad Sets  Strengthening;Right;2 sets;15 reps    Short Arc Quad Sets Limitations  2#    Straight Leg Raises  Strengthening;Right    Other Supine Knee/Hip Exercises  ball squeeze x30    Other Supine Knee/Hip Exercises  calm w red t-band x30      Electrical Stimulation   Electrical Stimulation Location  Bilateral LB.    Electrical Stimulation Action  premod    Electrical Stimulation Parameters  80-150hz  x35min    Electrical Stimulation Goals  Pain      Manual Therapy   Manual Therapy  Soft tissue mobilization    Soft tissue mobilization  manual STW to right tib ant to reduce trigger point  and reduce pain                  PT Long Term Goals - 10/10/19 1204      PT LONG TERM GOAL #1   Title  Independent with a HEP.    Time  6    Period  Weeks    Status  On-going  PT LONG TERM GOAL #2   Title  Increase right ankle dorsiflexion to 6- 8 degrees to normalize the patient's gait pattern.    Time  6    Period  Weeks    Status  On-going      PT LONG TERM GOAL #3   Title  Right LE strength 4+/5.    Time  6    Period  Weeks    Status  On-going      PT LONG TERM GOAL #4   Title  Walk 250 feet with normal dorsiflexion.    Time  6    Period  Weeks    Status  On-going      PT LONG TERM GOAL #5   Title  Perform a reciprocating stair gait with one railing.    Time  6    Period  Weeks    Status  On-going            Plan - 10/10/19 1205    Clinical Impression Statement  Patient ttolerated treatment fair due to ongoing pain in right LE and now today in left LE. Patient able to tolerate gentle supine ther ex today followed by manual STW to right ant tib. Patient has tigger point in right ant tib and feels symptoms down to right toe. Goals ongoing at this time due to increased pain with any prolong standing.    Personal Factors and Comorbidities  Comorbidity 1;Comorbidity 2    Comorbidities  HTN, DM, 2 previous spinal surgeries.    Examination-Activity  Limitations  Locomotion Level;Other;Sleep    Examination-Participation Restrictions  Other    Stability/Clinical Decision Making  Evolving/Moderate complexity    Rehab Potential  Good    PT Frequency  2x / week    PT Duration  6 weeks    PT Treatment/Interventions  ADLs/Self Care Home Management;Cryotherapy;Electrical Stimulation;Gait training;Ultrasound;Moist Heat;Stair training;Functional mobility training;Therapeutic activities;Therapeutic exercise;Neuromuscular re-education;Patient/family education;Passive range of motion;Manual techniques    PT Next Visit Plan  cont with Core exercise progression.  Please work on LE strengthening especially right dorsiflexion.  Modalities as needed. Please issue HEP when able    Consulted and Agree with Plan of Care  Patient       Patient will benefit from skilled therapeutic intervention in order to improve the following deficits and impairments:  Pain, Decreased activity tolerance, Abnormal gait, Decreased strength  Visit Diagnosis: Muscle weakness (generalized)     Problem List Patient Active Problem List   Diagnosis Date Noted  . DJD (degenerative joint disease) 09/24/2019  . Dizziness 09/23/2019  . HTN (hypertension) 09/23/2019  . HLD (hyperlipidemia) 09/23/2019  . CAD (coronary artery disease) 09/23/2019  . DM2 (diabetes mellitus, type 2) (HCC) 09/23/2019    Tim Torres, PTA 10/10/2019, 12:10 PM  [facility] [address] [place], [place], [address] Phone: [phone]   Fax:  [phone]  Name: Tim Torres MRN: [id number] Date of Birth: 12-30-1950

## 2019-10-16 ENCOUNTER — Ambulatory Visit: Payer: Medicare PPO | Attending: Neurosurgery | Admitting: Physical Therapy

## 2019-10-16 ENCOUNTER — Other Ambulatory Visit: Payer: Self-pay

## 2019-10-16 ENCOUNTER — Encounter: Payer: Self-pay | Admitting: Physical Therapy

## 2019-10-16 DIAGNOSIS — M6281 Muscle weakness (generalized): Secondary | ICD-10-CM | POA: Insufficient documentation

## 2019-10-16 NOTE — Therapy (Signed)
[facility] [place], [place], [address] Phone: [phone]   Fax:  [phone]  Physical Therapy Treatment  Patient Details  Name: Tim Torres MRN: [id number] Date of Birth: 10-Aug-1951 Referring Provider (PT): Earnie Larsson MD.   Encounter Date: 10/16/2019  PT End of Session - 10/16/19 1351    Visit Number  5    Number of Visits  12    Date for PT Re-Evaluation  11/12/19    Authorization Type  FOTO AT LEAST EVERY 5TH VISIT.  PROGRESS NOTE AT 10TH VISIT.    PT Start Time  1350    PT Stop Time  1441    PT Time Calculation (min)  51 min    Activity Tolerance  Patient tolerated treatment well    Behavior During Therapy  [facility] for tasks assessed/performed       Past Medical History:  Diagnosis Date  . Diabetes mellitus without complication ([place])   . Hypertension     Past Surgical History:  Procedure Laterality Date  . BACK SURGERY      There were no vitals filed for this visit.  Subjective Assessment - 10/16/19 1350    Subjective  COVID-19 screen performed prior to patient entering clinic. Patient reports soreness in lumbar region and still sore from lateral R knee to great toe.    Pertinent History  HTN, DM, 2 previous spinal surgeries.    How long can you walk comfortably?  Short community distances.    Diagnostic tests  MRI.    Patient Stated Goals  Be outdoors.  Hunt.    Currently in Pain?  Yes    Pain Score  6     Pain Location  Back    Pain Orientation  Lower    Pain Descriptors / Indicators  Constant;Sore;Aching    Pain Type  Surgical pain    Pain Onset  More than a month ago    Pain Frequency  Constant         [facility] PT Assessment - 10/16/19 0001      Assessment   Medical Diagnosis  Radiculopathy, Lumbar.    Referring Provider (PT)  Earnie Larsson MD.    Onset Date/Surgical Date  08/08/19    Next MD Visit  11/01/2019      Precautions   Precautions  Fall      Restrictions   Weight Bearing  Restrictions  No                   [facility] Adult PT Treatment/Exercise - 10/16/19 0001      Lumbar Exercises: Stretches   Passive Hamstring Stretch  Right;3 reps;30 seconds    Hip Flexor Stretch  Right;1 rep;10 seconds   more low back stretch than hip flexor     Lumbar Exercises: Aerobic   Nustep  L5 x10 min      Lumbar Exercises: Seated   Other Seated Lumbar Exercises  B sciatic glides x5 reps each   much greater pain with RLE     Lumbar Exercises: Supine   Clam  20 reps   yellow theraband   Bent Knee Raise  20 reps   yellow theraband   Other Supine Lumbar Exercises  B SAQ x15 reps 5-10 sec holds each    Other Supine Lumbar Exercises  Adductor squeeze x20 reps      Modalities   Modalities  Electrical Stimulation;Moist Heat      Moist Heat Therapy   Number  Minutes Moist Heat  15 Minutes    Moist Heat Location  Lumbar Spine      Electrical Stimulation   Electrical Stimulation Location  Bilateral LB.    Electrical Stimulation Action  Pre-Mod    Electrical Stimulation Parameters  80-150 hz x15 min    Electrical Stimulation Goals  Pain                  PT Long Term Goals - 10/10/19 1204      PT LONG TERM GOAL #1   Title  Independent with a HEP.    Time  6    Period  Weeks    Status  On-going      PT LONG TERM GOAL #2   Title  Increase right ankle dorsiflexion to 6- 8 degrees to normalize the patient's gait pattern.    Time  6    Period  Weeks    Status  On-going      PT LONG TERM GOAL #3   Title  Right LE strength 4+/5.    Time  6    Period  Weeks    Status  On-going      PT LONG TERM GOAL #4   Title  Walk 250 feet with normal dorsiflexion.    Time  6    Period  Weeks    Status  On-going      PT LONG TERM GOAL #5   Title  Perform a reciprocating stair gait with one railing.    Time  6    Period  Weeks    Status  On-going            Plan - 10/16/19 1443    Clinical Impression Statement  Patient tolerated today's treatment  fairly well although on-going weakness and nerve related pain in LLE. Patient experiences increased pain with L sciatic glides and limited with reps due to pain. L ankle DF limited and increased L great toe nerve pain reported during passive L HS stretch. Normal modalities response noted following removal of the modalities.    Personal Factors and Comorbidities  Comorbidity 1;Comorbidity 2    Comorbidities  HTN, DM, 2 previous spinal surgeries.    Examination-Activity Limitations  Locomotion Level;Other;Sleep    Examination-Participation Restrictions  Other    Stability/Clinical Decision Making  Evolving/Moderate complexity    Rehab Potential  Good    PT Frequency  2x / week    PT Duration  6 weeks    PT Treatment/Interventions  ADLs/Self Care Home Management;Cryotherapy;Electrical Stimulation;Gait training;Ultrasound;Moist Heat;Stair training;Functional mobility training;Therapeutic activities;Therapeutic exercise;Neuromuscular re-education;Patient/family education;Passive range of motion;Manual techniques    PT Next Visit Plan  cont with Core exercise progression.  Please work on LE strengthening especially right dorsiflexion.  Modalities as needed. Please issue HEP when able    Consulted and Agree with Plan of Care  Patient       Patient will benefit from skilled therapeutic intervention in order to improve the following deficits and impairments:  Pain, Decreased activity tolerance, Abnormal gait, Decreased strength  Visit Diagnosis: Muscle weakness (generalized)     Problem List Patient Active Problem List   Diagnosis Date Noted  . DJD (degenerative joint disease) 09/24/2019  . Dizziness 09/23/2019  . HTN (hypertension) 09/23/2019  . HLD (hyperlipidemia) 09/23/2019  . CAD (coronary artery disease) 09/23/2019  . DM2 (diabetes mellitus, type 2) (HCC) 09/23/2019    Marvell Fuller, PTA 10/16/2019, 2:54 PM  [facility] [address] [place], [place], [address] Phone: [phone]   Fax:  [phone]  Name: Tim Torres MRN: [id number] Date of Birth: 08-Dec-1950

## 2019-10-18 ENCOUNTER — Other Ambulatory Visit: Payer: Self-pay

## 2019-10-18 ENCOUNTER — Ambulatory Visit: Payer: Medicare PPO | Admitting: *Deleted

## 2019-10-18 DIAGNOSIS — M6281 Muscle weakness (generalized): Secondary | ICD-10-CM

## 2019-10-18 NOTE — Therapy (Signed)
[facility] [place], [place], [address] Phone: [phone]   Fax:  [phone]  Physical Therapy Treatment  Patient Details  Name: Tim Torres MRN: [id number] Date of Birth: 1951-08-09 Referring Provider (PT): Earnie Larsson MD.   Encounter Date: 10/18/2019  PT End of Session - 10/18/19 1705    Visit Number  6    Number of Visits  12    Date for PT Re-Evaluation  11/12/19    Authorization Type  FOTO AT LEAST EVERY 5TH VISIT.  PROGRESS NOTE AT 10TH VISIT.    PT Start Time  1346    PT Stop Time  1435    PT Time Calculation (min)  49 min       Past Medical History:  Diagnosis Date  . Diabetes mellitus without complication ([place])   . Hypertension     Past Surgical History:  Procedure Laterality Date  . BACK SURGERY      There were no vitals filed for this visit.  Subjective Assessment - 10/18/19 1346    Subjective  COVID-19 screen performed prior to patient entering clinic. Patient reports having 5/10 LBP and leg pain today    Pertinent History  HTN, DM, 2 previous spinal surgeries.    How long can you walk comfortably?  Short community distances.    Diagnostic tests  MRI.    Patient Stated Goals  Be outdoors.  Hunt.    Currently in Pain?  Yes    Pain Score  5     Pain Location  Back    Pain Orientation  Lower    Pain Descriptors / Indicators  Aching    Pain Type  Surgical pain    Pain Onset  More than a month ago                       [facility] Adult PT Treatment/Exercise - 10/18/19 0001      Lumbar Exercises: Stretches   Figure 4 Stretch  2 reps   attempted, but very sore     Lumbar Exercises: Aerobic   Nustep  L5 x10 min  LEs only      Lumbar Exercises: Supine   Ab Set  10 reps    Clam  20 reps   yellow theraband   Bent Knee Raise  20 reps   yellow theraband   Other Supine Lumbar Exercises  Adductor squeeze x20 reps with ball      Knee/Hip Exercises: Standing   Rocker Board  4  minutes   DF/PF motion and balance     Knee/Hip Exercises: Supine   Other Supine Knee/Hip Exercises  calm w red t-band x30      Modalities   Modalities  Electrical Stimulation;Moist Heat      Moist Heat Therapy   Number Minutes Moist Heat  15 Minutes    Moist Heat Location  Lumbar Spine      Electrical Stimulation   Electrical Stimulation Location  Bilateral LB.    Electrical Stimulation Action  premod 80-150hz  x 15 mins    Electrical Stimulation Goals  Pain      Manual Therapy   Manual Therapy  Passive ROM    Passive ROM  manual piriformis stretching RT LE                  PT Long Term Goals - 10/10/19 1204      PT LONG TERM GOAL #1   Title  Independent with a HEP.    Time  6    Period  Weeks    Status  On-going      PT LONG TERM GOAL #2   Title  Increase right ankle dorsiflexion to 6- 8 degrees to normalize the patient's gait pattern.    Time  6    Period  Weeks    Status  On-going      PT LONG TERM GOAL #3   Title  Right LE strength 4+/5.    Time  6    Period  Weeks    Status  On-going      PT LONG TERM GOAL #4   Title  Walk 250 feet with normal dorsiflexion.    Time  6    Period  Weeks    Status  On-going      PT LONG TERM GOAL #5   Title  Perform a reciprocating stair gait with one railing.    Time  6    Period  Weeks    Status  On-going            Plan - 10/18/19 1709    Clinical Impression Statement  Pt arrived today doing fair, but irritated  about Rt LE weakness. He was able to perform core and LE exs for strengthening ok, but very sore/ painful  with  active RT LE piriformis stretch. Passive piriformis stretch was performed and tolerated well. Normal modality response today..    Personal Factors and Comorbidities  Comorbidity 1;Comorbidity 2    Comorbidities  HTN, DM, 2 previous spinal surgeries.    Examination-Activity Limitations  Locomotion Level;Other;Sleep    Examination-Participation Restrictions  Other     Stability/Clinical Decision Making  Evolving/Moderate complexity    Rehab Potential  Good    PT Frequency  2x / week    PT Duration  6 weeks    PT Treatment/Interventions  ADLs/Self Care Home Management;Cryotherapy;Electrical Stimulation;Gait training;Ultrasound;Moist Heat;Stair training;Functional mobility training;Therapeutic activities;Therapeutic exercise;Neuromuscular re-education;Patient/family education;Passive range of motion;Manual techniques    PT Next Visit Plan  cont with Core exercise progression.  Please work on LE strengthening especially right dorsiflexion.  Modalities as needed. Please issue HEP when able       Patient will benefit from skilled therapeutic intervention in order to improve the following deficits and impairments:  Pain, Decreased activity tolerance, Abnormal gait, Decreased strength  Visit Diagnosis: Muscle weakness (generalized)     Problem List Patient Active Problem List   Diagnosis Date Noted  . DJD (degenerative joint disease) 09/24/2019  . Dizziness 09/23/2019  . HTN (hypertension) 09/23/2019  . HLD (hyperlipidemia) 09/23/2019  . CAD (coronary artery disease) 09/23/2019  . DM2 (diabetes mellitus, type 2) (HCC) 09/23/2019    RAMSEUR,[name], PTA 10/18/2019, 5:32 PM  [facility] [address] [place], [place], [address] Phone: [phone]   Fax:  [phone]  Name: Tim Torres MRN: [id number] Date of Birth: 04/17/1951

## 2019-10-21 DIAGNOSIS — U071 COVID-19: Secondary | ICD-10-CM

## 2019-10-21 HISTORY — DX: COVID-19: U07.1

## 2019-10-23 ENCOUNTER — Ambulatory Visit: Payer: Medicare PPO | Admitting: Physical Therapy

## 2019-10-24 ENCOUNTER — Telehealth: Payer: Self-pay | Admitting: *Deleted

## 2019-10-24 NOTE — Telephone Encounter (Signed)
Pt states he has tested positive covid. Reports fever this evening 101.9, now 101.4, states he took tylenol 10 minutes ago. Did not take any for temp of 101.9. Denies any SOB. Advised to continue tylenol as per directions, stay hydrated, light blankets. Advised ED if fever reaches 103.0. any SOB. Pt verbalizes understanding.

## 2019-10-25 ENCOUNTER — Encounter: Payer: Medicare PPO | Admitting: Physical Therapy

## 2019-10-27 ENCOUNTER — Other Ambulatory Visit: Payer: Self-pay

## 2019-10-27 ENCOUNTER — Emergency Department (HOSPITAL_COMMUNITY)
Admission: EM | Admit: 2019-10-27 | Discharge: 2019-10-27 | Disposition: A | Discharge status: Home / Self Care | Payer: Medicare PPO | Attending: Emergency Medicine | Admitting: Emergency Medicine

## 2019-10-27 ENCOUNTER — Encounter (HOSPITAL_COMMUNITY): Payer: Self-pay | Admitting: Emergency Medicine

## 2019-10-27 DIAGNOSIS — U071 COVID-19: Secondary | ICD-10-CM | POA: Insufficient documentation

## 2019-10-27 DIAGNOSIS — Z5321 Procedure and treatment not carried out due to patient leaving prior to being seen by health care provider: Secondary | ICD-10-CM | POA: Insufficient documentation

## 2019-10-27 DIAGNOSIS — R509 Fever, unspecified: Secondary | ICD-10-CM | POA: Insufficient documentation

## 2019-10-27 NOTE — ED Triage Notes (Signed)
Patient c/o symptoms from Covid not improving (fevers at night and productive cough). Per patient started to get sick 12/4 and went to [facility] and tested for Covid in which it was positive. Patient taking Mucinex, tylenol, increased fluid intake. Denies any increase in coughing or shortness of breath. Denies any chest pain, nausea, vomiting, or diarrhea. Patient does state he feels a "little swimmy headed."

## 2019-11-13 ENCOUNTER — Other Ambulatory Visit: Payer: Self-pay

## 2019-11-13 ENCOUNTER — Ambulatory Visit: Payer: Medicare PPO | Admitting: Physical Therapy

## 2019-11-13 DIAGNOSIS — M6281 Muscle weakness (generalized): Secondary | ICD-10-CM | POA: Diagnosis not present

## 2019-11-13 NOTE — Therapy (Signed)
[facility] [place], [place], [address] Phone: [phone]   Fax:  [phone]  Physical Therapy Treatment  Patient Details  Name: Tim Torres MRN: [id number] Date of Birth: 1951/03/22 Referring Provider (PT): Earnie Larsson MD.   Encounter Date: 11/13/2019  PT End of Session - 11/13/19 1347    Visit Number  7    Number of Visits  12    Date for PT Re-Evaluation  11/12/19    Authorization Type  FOTO AT LEAST EVERY 5TH VISIT.  PROGRESS NOTE AT 10TH VISIT.    PT Start Time  0102    PT Stop Time  0200    PT Time Calculation (min)  58 min    Activity Tolerance  Patient tolerated treatment well    Behavior During Therapy  WFL for tasks assessed/performed       Past Medical History:  Diagnosis Date  . COVID-19   . Diabetes mellitus without complication ([place])   . Hypertension     Past Surgical History:  Procedure Laterality Date  . BACK SURGERY      There were no vitals filed for this visit.  Subjective Assessment - 11/13/19 1313    Subjective  COVID-19 screen performed prior to patient entering clinic.  Continued    Pertinent History  HTN, DM, 2 previous spinal surgeries.    How long can you walk comfortably?  Short community distances.    Diagnostic tests  MRI.    Patient Stated Goals  Be outdoors.  Hunt.    Currently in Pain?  Yes                       [facility] Adult PT Treatment/Exercise - 11/13/19 0001      Exercises   Exercises  Knee/Hip      Lumbar Exercises: Aerobic   Nustep  Level 4 x 10 minutes.      Lumbar Exercises: Seated   Other Seated Lumbar Exercises  Seated hamstrings on weight machine x 3 minutes.      Knee/Hip Exercises: Standing   Rocker Board  5 minutes    Other Standing Knee Exercises  Dynadisc x 5 minutes.      Modalities   Modalities  Electrical Stimulation;Moist Heat      Moist Heat Therapy   Number Minutes Moist Heat  20 Minutes    Moist Heat Location   Lumbar Spine      Electrical Stimulation   Electrical Stimulation Location  Bilateral low back.    Electrical Stimulation Action  Pre-mod.    Electrical Stimulation Parameters  80-150 Hz x 20 minutes.    Electrical Stimulation Goals  Pain                  PT Long Term Goals - 10/10/19 1204      PT LONG TERM GOAL #1   Title  Independent with a HEP.    Time  6    Period  Weeks    Status  On-going      PT LONG TERM GOAL #2   Title  Increase right ankle dorsiflexion to 6- 8 degrees to normalize the patient's gait pattern.    Time  6    Period  Weeks    Status  On-going      PT LONG TERM GOAL #3   Title  Right LE strength 4+/5.    Time  6    Period  Weeks  Status  On-going      PT LONG TERM GOAL #4   Title  Walk 250 feet with normal dorsiflexion.    Time  6    Period  Weeks    Status  On-going      PT LONG TERM GOAL #5   Title  Perform a reciprocating stair gait with one railing.    Time  6    Period  Weeks    Status  On-going            Plan - 11/13/19 1338    Clinical Impression Statement  Patient continues to reports pain and weakness in his right posterior thigh and tibilias anterior and continue right foot drop.    Personal Factors and Comorbidities  Comorbidity 1;Comorbidity 2    Comorbidities  HTN, DM, 2 previous spinal surgeries.    Examination-Activity Limitations  Locomotion Level;Other;Sleep    Examination-Participation Restrictions  Other    Stability/Clinical Decision Making  Evolving/Moderate complexity    Rehab Potential  Good    PT Frequency  2x / week    PT Duration  6 weeks    PT Treatment/Interventions  ADLs/Self Care Home Management;Cryotherapy;Electrical Stimulation;Gait training;Ultrasound;Moist Heat;Stair training;Functional mobility training;Therapeutic activities;Therapeutic exercise;Neuromuscular re-education;Patient/family education;Passive range of motion;Manual techniques    PT Next Visit Plan  cont with Core exercise  progression.  Please work on LE strengthening especially right dorsiflexion.  Modalities as needed. Please issue HEP when able    Consulted and Agree with Plan of Care  Patient       Patient will benefit from skilled therapeutic intervention in order to improve the following deficits and impairments:  Pain, Decreased activity tolerance, Abnormal gait, Decreased strength  Visit Diagnosis: Muscle weakness (generalized)     Problem List Patient Active Problem List   Diagnosis Date Noted  . DJD (degenerative joint disease) 09/24/2019  . Dizziness 09/23/2019  . HTN (hypertension) 09/23/2019  . HLD (hyperlipidemia) 09/23/2019  . CAD (coronary artery disease) 09/23/2019  . DM2 (diabetes mellitus, type 2) (HCC) 09/23/2019    Nahomy Limburg, [place] MPT 11/13/2019, 2:10 PM  [facility] [address] [place], [place], [address] Phone: [phone]   Fax:  [phone]  Name: Tim Torres MRN: [id number] Date of Birth: [birth year]

## 2019-11-14 ENCOUNTER — Ambulatory Visit: Payer: Medicare PPO | Admitting: Physical Therapy

## 2019-11-14 ENCOUNTER — Other Ambulatory Visit: Payer: Self-pay

## 2019-11-14 ENCOUNTER — Encounter: Payer: Self-pay | Admitting: Physical Therapy

## 2019-11-14 DIAGNOSIS — M6281 Muscle weakness (generalized): Secondary | ICD-10-CM | POA: Diagnosis not present

## 2019-11-14 NOTE — Therapy (Signed)
[facility] [place], [place], [address] Phone: [phone]   Fax:  [phone]  Physical Therapy Treatment  Patient Details  Name: Tim Torres MRN: [id number] Date of Birth: June 26, 1951 Referring Provider (PT): Earnie Larsson MD.   Encounter Date: 11/14/2019  PT End of Session - 11/14/19 1322    Visit Number  8    Number of Visits  12    Date for PT Re-Evaluation  11/12/19    Authorization Type  FOTO AT LEAST EVERY 5TH VISIT.  PROGRESS NOTE AT 10TH VISIT.    PT Start Time  0100    PT Stop Time  0144    PT Time Calculation (min)  44 min    Activity Tolerance  Patient tolerated treatment well    Behavior During Therapy  WFL for tasks assessed/performed       Past Medical History:  Diagnosis Date  . COVID-19   . Diabetes mellitus without complication ([place])   . Hypertension     Past Surgical History:  Procedure Laterality Date  . BACK SURGERY      There were no vitals filed for this visit.  Subjective Assessment - 11/14/19 1259    Subjective  COVID-19 screen performed prior to patient entering clinic.  Sore today after yesterdays exercises    Pertinent History  HTN, DM, 2 previous spinal surgeries.    How long can you walk comfortably?  Short community distances.    Diagnostic tests  MRI.    Patient Stated Goals  Be outdoors.  Hunt.    Currently in Pain?  Yes    Pain Score  4     Pain Location  Leg    Pain Orientation  Right    Pain Descriptors / Indicators  Aching    Pain Type  Chronic pain    Pain Onset  More than a month ago    Pain Frequency  Constant    Aggravating Factors   prolong standing activity    Pain Relieving Factors  at rest         [facility] PT Assessment - 11/14/19 0001      ROM / Strength   AROM / PROM / Strength  AROM;PROM;Strength      AROM   AROM Assessment Site  Ankle    Right/Left Ankle  Right    Right Ankle Dorsiflexion  0      PROM   PROM Assessment Site  Ankle     Right/Left Ankle  Right    Right Ankle Dorsiflexion  5      Strength   Strength Assessment Site  Knee;Ankle    Right/Left Knee  Right    Right Knee Flexion  4/5    Right Knee Extension  4/5    Right/Left Ankle  Right    Right Ankle Dorsiflexion  1/5                   [facility] Adult PT Treatment/Exercise - 11/14/19 0001      Lumbar Exercises: Aerobic   Nustep  Level 3 x 10 min UE/LE activity      Lumbar Exercises: Supine   Clam  20 reps   yellow t-band   Bent Knee Raise  20 reps    Other Supine Lumbar Exercises  Adductor squeeze and draw in x20 reps with ball      Knee/Hip Exercises: Standing   Rocker Board  3 minutes      Knee/Hip Exercises:  Seated   Other Seated Knee/Hip Exercises  seated DF with prostretch x 63mn for gentle ROM      Knee/Hip Exercises: Supine   Other Supine Knee/Hip Exercises  attempted manual DF isometrics yet unable      Moist Heat Therapy   Number Minutes Moist Heat  15 Minutes    Moist Heat Location  Lumbar Spine      Electrical Stimulation   Electrical Stimulation Location  Bilateral low back.    Electrical Stimulation Action  premod    Electrical Stimulation Parameters  80-_0  x145m    Electrical Stimulation Goals  Pain                  PT Long Term Goals - 11/14/19 1307      PT LONG TERM GOAL #1   Title  Independent with a HEP.    Time  6    Period  Weeks    Status  On-going      PT LONG TERM GOAL #2   Title  Increase right ankle dorsiflexion to 6- 8 degrees to normalize the patient's gait pattern.    Time  6    Period  Weeks    Status  On-going   AROM 0 degrees /PROM 5 degrees 11/14/19     PT LONG TERM GOAL #3   Title  Right LE strength 4+/5.    Time  6    Period  Weeks    Status  On-going      PT LONG TERM GOAL #4   Title  Walk 250 feet with normal dorsiflexion.    Time  6    Period  Weeks    Status  On-going   no improvement 11/14/19     PT LONG TERM GOAL #5   Title  Perform a reciprocating  stair gait with one railing.    Time  6    Period  Weeks    Status  On-going   unable at this time 11/14/19           Plan - 11/14/19 1336    Clinical Impression Statement  Patient tolerated treatment well although sore from yesterdays therapy. Patient has been out for some time and just coming back to therapy has made him sore and fatigue. Patient overall has improved knee strength by one yet no improvement with DF today. Patient only able to tolerate mild activities today. No further goals met  at this time. Patient would like to continue therapy, order has ran out, sending for a re-cert today.    Personal Factors and Comorbidities  Comorbidity 1;Comorbidity 2    Comorbidities  HTN, DM, 2 previous spinal surgeries.    Examination-Activity Limitations  Locomotion Level;Other;Sleep    Examination-Participation Restrictions  Other    Stability/Clinical Decision Making  Evolving/Moderate complexity    Rehab Potential  Good    PT Frequency  2x / week    PT Duration  6 weeks    PT Treatment/Interventions  ADLs/Self Care Home Management;Cryotherapy;Electrical Stimulation;Gait training;Ultrasound;Moist Heat;Stair training;Functional mobility training;Therapeutic activities;Therapeutic exercise;Neuromuscular re-education;Patient/family education;Passive range of motion;Manual techniques    PT Next Visit Plan  cont with Core exercise progression.  Please work on LE strengthening especially right dorsiflexion.  Modalities as needed. Please issue HEP when able    Consulted and Agree with Plan of Care  Patient       Patient will benefit from skilled therapeutic intervention in order to improve the following deficits and impairments:  Pain,  Decreased activity tolerance, Abnormal gait, Decreased strength  Visit Diagnosis: Muscle weakness (generalized)     Problem List Patient Active Problem List   Diagnosis Date Noted  . DJD (degenerative joint disease) 09/24/2019  . Dizziness 09/23/2019   . HTN (hypertension) 09/23/2019  . HLD (hyperlipidemia) 09/23/2019  . CAD (coronary artery disease) 09/23/2019  . DM2 (diabetes mellitus, type 2) ([place]) 09/23/2019    Ladean Raya, PTA 11/14/19 1:47 PM  [facility] [address] [place], [place], [address] Phone: [phone]   Fax:  [phone]  Name: VILAS EDGERLY MRN: [id number] Date of Birth: [birth year]

## 2019-11-20 ENCOUNTER — Encounter: Payer: Self-pay | Admitting: Physical Therapy

## 2019-11-20 ENCOUNTER — Other Ambulatory Visit: Payer: Self-pay

## 2019-11-20 ENCOUNTER — Ambulatory Visit: Payer: Medicare HMO | Attending: Neurosurgery | Admitting: Physical Therapy

## 2019-11-20 DIAGNOSIS — M6281 Muscle weakness (generalized): Secondary | ICD-10-CM | POA: Diagnosis present

## 2019-11-20 NOTE — Therapy (Signed)
[facility] [address] [place], [place], [address] Phone: [phone]   Fax:  [phone]  Physical Therapy Treatment  Patient Details  Name: SINA LUCCHESI MRN: [id number] Date of Birth: 02/12/1951 Referring Provider (PT): Julio Sicks MD.   Encounter Date: 11/20/2019  PT End of Session - 11/20/19 2135    Visit Number  9    Number of Visits  12    Date for PT Re-Evaluation  12/10/19    Authorization Type  FOTO AT LEAST EVERY 5TH VISIT.  PROGRESS NOTE AT 10TH VISIT.    PT Start Time  1300    PT Stop Time  1348    PT Time Calculation (min)  48 min    Activity Tolerance  Patient tolerated treatment well    Behavior During Therapy  WFL for tasks assessed/performed       Past Medical History:  Diagnosis Date  . COVID-19   . Diabetes mellitus without complication (HCC)   . Hypertension     Past Surgical History:  Procedure Laterality Date  . BACK SURGERY      There were no vitals filed for this visit.  Subjective Assessment - 11/20/19 1306    Subjective  COVID-19 screen performed prior to patient entering clinic. Patient arrives with ongoing back pain and numbness and weakness in both LEs.    Pertinent History  HTN, DM, 2 previous spinal surgeries.    How long can you walk comfortably?  Short community distances.    Diagnostic tests  MRI.    Patient Stated Goals  Be outdoors.  Hunt.    Currently in Pain?  Yes    Pain Score  5     Pain Location  Leg    Pain Orientation  Right    Pain Descriptors / Indicators  Aching;Numbness    Pain Type  Chronic pain    Pain Onset  More than a month ago    Pain Frequency  Constant         [facility] PT Assessment - 11/20/19 0001      Assessment   Medical Diagnosis  Radiculopathy, Lumbar.    Referring Provider (PT)  Julio Sicks MD.    Onset Date/Surgical Date  08/08/19    Next MD Visit  December 06, 2019      Precautions   Precautions  [place] Ma  Adult PT Treatment/Exercise - 11/20/19 0001      Exercises   Exercises  Lumbar;Knee/Hip      Lumbar Exercises: Aerobic   Nustep  Level 4 x 10 min UE/LE activity      Lumbar Exercises: Supine   Bent Knee Raise  20 reps    Bridge  10 reps    Other Supine Lumbar Exercises  --      Knee/Hip Exercises: Standing   Rocker Board  5 minutes      Knee/Hip Exercises: Seated   Long Arc Quad  Strengthening;Both;2 sets;10 reps    Clamshell with TheraBand  Red   x20   Marching  Strengthening;Both;2 sets;10 reps    Marching Limitations  red theraband      Modalities   Modalities  Electrical Stimulation;Moist Heat      Moist Heat Therapy   Number Minutes Moist Heat  15 Minutes    Moist Heat Location  Lumbar Spine      Electrical Stimulation  Electrical Stimulation Location  bilateral low back    Electrical Stimulation Action  pre-mod    Electrical Stimulation Parameters  80-150 hz x15 mins    Electrical Stimulation Goals  Pain                  PT Long Term Goals - 11/14/19 1307      PT LONG TERM GOAL #1   Title  Independent with a HEP.    Time  6    Period  Weeks    Status  On-going      PT LONG TERM GOAL #2   Title  Increase right ankle dorsiflexion to 6- 8 degrees to normalize the patient's gait pattern.    Time  6    Period  Weeks    Status  On-going   AROM 0 degrees /PROM 5 degrees 11/14/19     PT LONG TERM GOAL #3   Title  Right LE strength 4+/5.    Time  6    Period  Weeks    Status  On-going      PT LONG TERM GOAL #4   Title  Walk 250 feet with normal dorsiflexion.    Time  6    Period  Weeks    Status  On-going   no improvement 11/14/19     PT LONG TERM GOAL #5   Title  Perform a reciprocating stair gait with one railing.    Time  6    Period  Weeks    Status  On-going   unable at this time 11/14/19           Plan - 11/20/19 2132    Clinical Impression Statement  Patient tolerated treatment fairly and reported feeling like he was  about to catch a cramp with various TEs. Patient required cuing for form and technique as well as importance of slow and controlled movement rather than the use of momentum. Pat[place] reported overall improvement in function but still is experiencing neurological symptoms and lack of DF mobility.    Personal Factors and Comorbidities  Comorbidity 1;Comorbidity 2    Comorbidities  HTN, DM, 2 previous spinal surgeries.    Examination-Activity Limitations  Locomotion Level;Other;Sleep    Examination-Participation Restrictions  Other    Stability/Clinical Decision Making  Evolving/Moderate complexity    Clinical Decision Making  Moderate    Rehab Potential  Good    PT Frequency  2x / week    PT Duration  6 weeks    PT Treatment/Interventions  ADLs/Self Care Home Management;Cryotherapy;Electrical Stimulation;Gait training;Ultrasound;Moist Heat;Stair training;Functional mobility training;Therapeutic activities;Therapeutic exercise;Neuromuscular re-education;Patient/family education;Passive range of motion;Manual techniques    PT Next Visit Plan  cont with Core exercise progression.  Please work on LE strengthening especially right dorsiflexion.  Modalities as needed. Please issue HEP when able    Consulted and Agree with Plan of Care  Patient       Patient will benefit from skilled therapeutic intervention in order to improve the following deficits and impairments:  Pain, Decreased activity tolerance, Abnormal gait, Decreased strength  Visit Diagnosis: Muscle weakness (generalized)     Problem List Patient Active Problem List   Diagnosis Date Noted  . DJD (degenerative joint disease) 09/24/2019  . Dizziness 09/23/2019  . HTN (hypertension) 09/23/2019  . HLD (hyperlipidemia) 09/23/2019  . CAD (coronary artery disease) 09/23/2019  . DM2 (diabetes mellitus, type 2) (HCC) 09/23/2019    Guss Bunde, PT, DPT 11/20/2019, 9:37 PM  [facility] [place], [place], [address] Phone: [phone]   Fax:  [phone]  Name: BENETT SWOYER MRN: [id number] Date of Birth: 1951/08/06

## 2019-11-22 ENCOUNTER — Other Ambulatory Visit: Payer: Self-pay

## 2019-11-22 ENCOUNTER — Ambulatory Visit: Payer: Medicare HMO | Admitting: *Deleted

## 2019-11-22 DIAGNOSIS — M6281 Muscle weakness (generalized): Secondary | ICD-10-CM

## 2019-11-22 NOTE — Therapy (Signed)
[facility] [address] [place], [place], [address] Phone: [phone]   Fax:  [phone]  Physical Therapy Treatment  Patient Details  Name: Tim Torres MRN: [id number] Date of Birth: 05-May-1951 Referring Provider (PT): Julio Sicks MD.   Encounter Date: 11/22/2019  PT End of Session - 11/22/19 1309    Visit Number  10    Number of Visits  12    Date for PT Re-Evaluation  12/10/19    Authorization Type  FOTO AT LEAST EVERY 5TH VISIT.  PROGRESS NOTE AT 10TH VISIT.   10th visit FOTO score 53% limitation    PT Start Time  1300    PT Stop Time  1350    PT Time Calculation (min)  50 min       Past Medical History:  Diagnosis Date  . COVID-19   . Diabetes mellitus without complication (HCC)   . Hypertension     Past Surgical History:  Procedure Laterality Date  . BACK SURGERY      There were no vitals filed for this visit.  Subjective Assessment - 11/22/19 1301    Subjective  COVID-19 screen performed prior to patient entering clinic. Patient arrives reporting soreness after last Rx.    Pertinent History  HTN, DM, 2 previous spinal surgeries.    How long can you walk comfortably?  Short community distances.    Diagnostic tests  MRI.    Patient Stated Goals  Be outdoors.  Hunt.    Currently in Pain?  Yes    Pain Score  4     Pain Location  Leg    Pain Orientation  Right    Pain Descriptors / Indicators  Aching;Numbness    Pain Type  Chronic pain    Pain Onset  More than a month ago    Pain Frequency  Constant                       [facility] Adult PT Treatment/Exercise - 11/22/19 0001      Exercises   Exercises  Lumbar;Knee/Hip      Lumbar Exercises: Aerobic   Nustep  Level 5 x 12 min UE/LE activity      Lumbar Exercises: Supine   Clam  20 reps    Bent Knee Raise  20 reps;10 reps    Bridge  10 reps;20 reps      Knee/Hip Exercises: Standing   Rocker Board  5 minutes   PF/DF and balance   Other  Standing Knee Exercises  seated dyna-disc x 3 mins RT ankle      Knee/Hip Exercises: Seated   Long Arc Quad  Strengthening;Both;2 sets;10 reps      Modalities   Modalities  Electrical Stimulation;Moist Heat      Moist Heat Therapy   Number Minutes Moist Heat  15 Minutes    Moist Heat Location  Lumbar Spine      Electrical Stimulation   Electrical Stimulation Location  bilateral low back    Electrical Stimulation Action  premod    Electrical Stimulation Parameters  80-150hz  x15 mins    Electrical Stimulation Goals  Pain                  PT Long Term Goals - 11/14/19 1307      PT LONG TERM GOAL #1   Title  Independent with a HEP.    Time  6    Period  Weeks  Status  On-going      PT LONG TERM GOAL #2   Title  Increase right ankle dorsiflexion to 6- 8 degrees to normalize the patient's gait pattern.    Time  6    Period  Weeks    Status  On-going   AROM 0 degrees /PROM 5 degrees 11/14/19     PT LONG TERM GOAL #3   Title  Right LE strength 4+/5.    Time  6    Period  Weeks    Status  On-going      PT LONG TERM GOAL #4   Title  Walk 250 feet with normal dorsiflexion.    Time  6    Period  Weeks    Status  On-going   no improvement 11/14/19     PT LONG TERM GOAL #5   Title  Perform a reciprocating stair gait with one railing.    Time  6    Period  Weeks    Status  On-going   unable at this time 11/14/19           Plan - 11/22/19 1345    Clinical Impression Statement  Pt arrived today doing about the same as far as RT leg pain and numbness. Rx focused on core strengthening and RT ankle therex especially DF.  Normal modality response today.    Personal Factors and Comorbidities  Comorbidity 1;Comorbidity 2    Comorbidities  HTN, DM, 2 previous spinal surgeries.    Examination-Activity Limitations  Locomotion Level;Other;Sleep    Stability/Clinical Decision Making  Evolving/Moderate complexity    Rehab Potential  Good    PT Frequency  2x / week     PT Duration  6 weeks    PT Treatment/Interventions  ADLs/Self Care Home Management;Cryotherapy;Electrical Stimulation;Gait training;Ultrasound;Moist Heat;Stair training;Functional mobility training;Therapeutic activities;Therapeutic exercise;Neuromuscular re-education;Patient/family education;Passive range of motion;Manual techniques    PT Next Visit Plan  cont with Core exercise progression.  Please work on LE strengthening especially right dorsiflexion.       Patient will benefit from skilled therapeutic intervention in order to improve the following deficits and impairments:     Visit Diagnosis: Muscle weakness (generalized)     Problem List Patient Active Problem List   Diagnosis Date Noted  . DJD (degenerative joint disease) 09/24/2019  . Dizziness 09/23/2019  . HTN (hypertension) 09/23/2019  . HLD (hyperlipidemia) 09/23/2019  . CAD (coronary artery disease) 09/23/2019  . DM2 (diabetes mellitus, type 2) ([place]) 09/23/2019    Tim Torres,Tim Torres, PTA 11/22/2019, 3:54 PM  [facility] [address] [place], [place], [address] Phone: [phone]   Fax:  [phone]  Name: Tim Torres MRN: [id number] Date of Birth: [birth year]

## 2019-11-27 ENCOUNTER — Other Ambulatory Visit: Payer: Self-pay

## 2019-11-27 ENCOUNTER — Ambulatory Visit: Payer: Medicare HMO | Admitting: Physical Therapy

## 2019-11-27 DIAGNOSIS — M6281 Muscle weakness (generalized): Secondary | ICD-10-CM | POA: Diagnosis not present

## 2019-11-27 NOTE — Therapy (Signed)
[facility] [place], [place], [address] Phone: [phone]   Fax:  [phone]  Physical Therapy Treatment  Patient Details  Name: Tim Torres MRN: [id number] Date of Birth: 1951-08-22 Referring Provider (PT): Earnie Larsson MD.   Encounter Date: 11/27/2019  PT End of Session - 11/27/19 1340    Visit Number  11    Number of Visits  12    Date for PT Re-Evaluation  12/10/19    Authorization Type  FOTO AT LEAST EVERY 5TH VISIT.  PROGRESS NOTE AT 10TH VISIT.   10th visit FOTO score 53% limitation    PT Start Time  0100    PT Stop Time  0157    PT Time Calculation (min)  57 min    Activity Tolerance  Patient tolerated treatment well    Behavior During Therapy  WFL for tasks assessed/performed       Past Medical History:  Diagnosis Date  . COVID-19   . Diabetes mellitus without complication ([place])   . Hypertension     Past Surgical History:  Procedure Laterality Date  . BACK SURGERY      There were no vitals filed for this visit.  Subjective Assessment - 11/27/19 1312    Subjective  COVID-19 screen performed prior to patient entering clinic.  Sore from pressuring washing.    Pertinent History  HTN, DM, 2 previous spinal surgeries.    How long can you walk comfortably?  Short community distances.    Diagnostic tests  MRI.    Patient Stated Goals  Be outdoors.  Hunt.    Currently in Pain?  Yes    Pain Score  4     Pain Location  Leg    Pain Orientation  Right    Pain Descriptors / Indicators  Aching;Numbness    Pain Type  Chronic pain    Pain Onset  More than a month ago                       [facility] Adult PT Treatment/Exercise - 11/27/19 0001      Exercises   Exercises  Knee/Hip;Ankle      Lumbar Exercises: Aerobic   Nustep  level 5 x 12 minutes.      Lumbar Exercises: Machines for Strengthening   Cybex Lumbar Extension  10# x 2 minutes limited range.    Cybex Knee Extension  30# x 3  minutes.      Modalities   Modalities  Electrical Stimulation;Moist Heat      Moist Heat Therapy   Number Minutes Moist Heat  20 Minutes    Moist Heat Location  Lumbar Spine      Electrical Stimulation   Electrical Stimulation Location  LB    Electrical Stimulation Action  Pre-mod.    Electrical Stimulation Parameters  80-150 Hz x 20 minutes.    Electrical Stimulation Goals  Pain      Ankle Exercises: Standing   Other Standing Ankle Exercises  Rockerboard and inverted BOSU (6 minutes) in parallel bars for ankle strengthening.                  PT Long Term Goals - 11/14/19 1307      PT LONG TERM GOAL #1   Title  Independent with a HEP.    Time  6    Period  Weeks    Status  On-going      PT LONG TERM  GOAL #2   Title  Increase right ankle dorsiflexion to 6- 8 degrees to normalize the patient's gait pattern.    Time  6    Period  Weeks    Status  On-going   AROM 0 degrees /PROM 5 degrees 11/14/19     PT LONG TERM GOAL #3   Title  Right LE strength 4+/5.    Time  6    Period  Weeks    Status  On-going      PT LONG TERM GOAL #4   Title  Walk 250 feet with normal dorsiflexion.    Time  6    Period  Weeks    Status  On-going   no improvement 11/14/19     PT LONG TERM GOAL #5   Title  Perform a reciprocating stair gait with one railing.    Time  6    Period  Weeks    Status  On-going   unable at this time 11/14/19           Plan - 11/27/19 1341    Clinical Impression Statement  Patient did well with the addition of kne extension performed through a limited range today.    Personal Factors and Comorbidities  Comorbidity 1;Comorbidity 2    Comorbidities  HTN, DM, 2 previous spinal surgeries.    Examination-Activity Limitations  Locomotion Level;Other;Sleep    Examination-Participation Restrictions  Other    Stability/Clinical Decision Making  Evolving/Moderate complexity    Rehab Potential  Good    PT Frequency  2x / week    PT Duration  6 weeks     PT Treatment/Interventions  ADLs/Self Care Home Management;Cryotherapy;Electrical Stimulation;Gait training;Ultrasound;Moist Heat;Stair training;Functional mobility training;Therapeutic activities;Therapeutic exercise;Neuromuscular re-education;Patient/family education;Passive range of motion;Manual techniques    PT Next Visit Plan  cont with Core exercise progression.  Please work on LE strengthening especially right dorsiflexion.    Consulted and Agree with Plan of Care  Patient       Patient will benefit from skilled therapeutic intervention in order to improve the following deficits and impairments:  Pain, Decreased activity tolerance, Abnormal gait, Decreased strength  Visit Diagnosis: Muscle weakness (generalized)     Problem List Patient Active Problem List   Diagnosis Date Noted  . DJD (degenerative joint disease) 09/24/2019  . Dizziness 09/23/2019  . HTN (hypertension) 09/23/2019  . HLD (hyperlipidemia) 09/23/2019  . CAD (coronary artery disease) 09/23/2019  . DM2 (diabetes mellitus, type 2) (HCC) 09/23/2019    Elvyn Krohn, [place] MPT 11/27/2019, 2:43 PM  [facility] [address] [place], [place], [address] Phone: [phone]   Fax:  [phone]  Name: JAVONI LUCKEN MRN: [id number] Date of Birth: October 23, 1951

## 2019-11-29 ENCOUNTER — Ambulatory Visit: Payer: Medicare HMO | Admitting: *Deleted

## 2019-11-29 ENCOUNTER — Other Ambulatory Visit: Payer: Self-pay

## 2019-11-29 DIAGNOSIS — M6281 Muscle weakness (generalized): Secondary | ICD-10-CM

## 2019-11-29 NOTE — Therapy (Signed)
[facility] [place], [place], [address] Phone: [phone]   Fax:  [phone]  Physical Therapy Treatment  Patient Details  Name: Tim Torres MRN: [id number] Date of Birth: [birth year] Referring Provider (PT): Earnie Larsson MD.   Encounter Date: 11/29/2019  PT End of Session - 11/29/19 1757    Visit Number  12    Number of Visits  12    Date for PT Re-Evaluation  12/10/19    Authorization Type  FOTO AT LEAST EVERY 5TH VISIT.  PROGRESS NOTE AT 10TH VISIT.   10th visit FOTO score 53% limitation    PT Start Time  1300    PT Stop Time  1350    PT Time Calculation (min)  50 min       Past Medical History:  Diagnosis Date  . COVID-19   . Diabetes mellitus without complication ([place])   . Hypertension     Past Surgical History:  Procedure Laterality Date  . BACK SURGERY      There were no vitals filed for this visit.  Subjective Assessment - 11/29/19 1300    Subjective  COVID-19 screen performed prior to patient entering clinic.  Fell in the yard yesterday, but im ok    Pertinent History  HTN, DM, 2 previous spinal surgeries.    How long can you walk comfortably?  Short community distances.    Diagnostic tests  MRI.    Patient Stated Goals  Be outdoors.  Hunt.    Currently in Pain?  Yes    Pain Score  4     Pain Location  Leg    Pain Orientation  Right    Pain Descriptors / Indicators  Aching;Numbness    Pain Type  Chronic pain    Pain Onset  More than a month ago                       [facility] Adult PT Treatment/Exercise - 11/29/19 0001      Exercises   Exercises  Knee/Hip;Ankle      Lumbar Exercises: Aerobic   Nustep  level 5 x 15 minutes.      Lumbar Exercises: Machines for Strengthening   Cybex Knee Extension  10# x 3 minutes.   with pause at top   Cybex Knee Flexion  30# x 3 mins      Knee/Hip Exercises: Standing   Rocker Board  5 minutes   PF/DF and balance     Modalities   Modalities  Electrical Stimulation;Moist Heat      Moist Heat Therapy   Number Minutes Moist Heat  15 Minutes    Moist Heat Location  Lumbar Spine      Electrical Stimulation   Electrical Stimulation Location  LB    Electrical Stimulation Action  premod    Electrical Stimulation Parameters  80-150hz  x 15 min    Electrical Stimulation Goals  Pain      Manual Therapy   Passive ROM  passive HS stretching and gentle nerve glides attempted RT LE, but fairly uncomfotable for PT      Ankle Exercises: Standing   Rocker Board  5 minutes   PF/ DF and balance                 PT Long Term Goals - 11/14/19 1307      PT LONG TERM GOAL #1   Title  Independent with a HEP.  Time  6    Period  Weeks    Status  On-going      PT LONG TERM GOAL #2   Title  Increase right ankle dorsiflexion to 6- 8 degrees to normalize the patient's gait pattern.    Time  6    Period  Weeks    Status  On-going   AROM 0 degrees /PROM 5 degrees 11/14/19     PT LONG TERM GOAL #3   Title  Right LE strength 4+/5.    Time  6    Period  Weeks    Status  On-going      PT LONG TERM GOAL #4   Title  Walk 250 feet with normal dorsiflexion.    Time  6    Period  Weeks    Status  On-going   no improvement 11/14/19     PT LONG TERM GOAL #5   Title  Perform a reciprocating stair gait with one railing.    Time  6    Period  Weeks    Status  On-going   unable at this time 11/14/19           Plan - 11/29/19 1310    Clinical Impression Statement  Pt arrived today reporting falling at home in his yard , but doing ok. He was guided through therex for LE strengthening as well as balance acts for nuero-reed. Manual HS stretching and nerve glides were attempted, but very uncomfortable for PT    Personal Factors and Comorbidities  Comorbidity 1;Comorbidity 2    Comorbidities  HTN, DM, 2 previous spinal surgeries.    Examination-Activity Limitations  Locomotion Level;Other;Sleep    Stability/Clinical  Decision Making  Evolving/Moderate complexity    Rehab Potential  Good    PT Frequency  2x / week    PT Duration  6 weeks    PT Treatment/Interventions  ADLs/Self Care Home Management;Cryotherapy;Electrical Stimulation;Gait training;Ultrasound;Moist Heat;Stair training;Functional mobility training;Therapeutic activities;Therapeutic exercise;Neuromuscular re-education;Patient/family education;Passive range of motion;Manual techniques    PT Next Visit Plan  cont with Core exercise progression.  Please work on LE strengthening especially right dorsiflexion.    Consulted and Agree with Plan of Care  Patient       Patient will benefit from skilled therapeutic intervention in order to improve the following deficits and impairments:  Pain, Decreased activity tolerance, Abnormal gait, Decreased strength  Visit Diagnosis: Muscle weakness (generalized)     Problem List Patient Active Problem List   Diagnosis Date Noted  . DJD (degenerative joint disease) 09/24/2019  . Dizziness 09/23/2019  . HTN (hypertension) 09/23/2019  . HLD (hyperlipidemia) 09/23/2019  . CAD (coronary artery disease) 09/23/2019  . DM2 (diabetes mellitus, type 2) (HCC) 09/23/2019    Camile Esters,[name], PTA 11/29/2019, 6:12 PM  [facility] [address] [place], [place], [address] Phone: [phone]   Fax:  [phone]  Name: Tim Torres MRN: [id number] Date of Birth: 05/29/1951

## 2019-12-04 ENCOUNTER — Other Ambulatory Visit: Payer: Self-pay

## 2019-12-04 ENCOUNTER — Ambulatory Visit: Payer: Medicare HMO | Admitting: *Deleted

## 2019-12-04 DIAGNOSIS — M6281 Muscle weakness (generalized): Secondary | ICD-10-CM

## 2019-12-04 NOTE — Therapy (Signed)
[facility] [place], [place], [address] Phone: [phone]   Fax:  [phone]  Physical Therapy Treatment  Patient Details  Name: Tim Torres MRN: [id number] Date of Birth: August 25, 1951 Referring Provider (PT): Earnie Larsson MD.   Encounter Date: 12/04/2019  PT End of Session - 12/04/19 1307    Visit Number  13    Number of Visits  13    Date for PT Re-Evaluation  12/10/19    Authorization Type  FOTO AT LEAST EVERY 5TH VISIT.  PROGRESS NOTE AT 10TH VISIT.   10th visit FOTO score 53% limitation    PT Start Time  1300    PT Stop Time  1350    PT Time Calculation (min)  50 min       Past Medical History:  Diagnosis Date  . COVID-19   . Diabetes mellitus without complication ([place])   . Hypertension     Past Surgical History:  Procedure Laterality Date  . BACK SURGERY      There were no vitals filed for this visit.  Subjective Assessment - 12/04/19 1306    Subjective  COVID-19 screen performed prior to patient entering clinic. To MD on Thursday    Pertinent History  HTN, DM, 2 previous spinal surgeries.    How long can you walk comfortably?  Short community distances.    Diagnostic tests  MRI.    Patient Stated Goals  Be outdoors.  Hunt.    Currently in Pain?  Yes    Pain Score  4     Pain Location  Leg    Pain Orientation  Right    Pain Descriptors / Indicators  Aching;Numbness    Pain Type  Chronic pain    Pain Onset  More than a month ago                       [facility] Adult PT Treatment/Exercise - 12/04/19 0001      Exercises   Exercises  Knee/Hip;Ankle      Lumbar Exercises: Aerobic   Nustep  level 5 x 15 minutes.      Lumbar Exercises: Machines for Strengthening   Cybex Knee Extension  10# x 3 minutes.   with pause at top   Cybex Knee Flexion  30# x 3 mins      Knee/Hip Exercises: Standing   Rocker Board  5 minutes   PF/DF and balance     Modalities   Modalities  Electrical  Stimulation;Moist Heat      Moist Heat Therapy   Number Minutes Moist Heat  15 Minutes    Moist Heat Location  Lumbar Spine      Electrical Stimulation   Electrical Stimulation Location  LB    Electrical Stimulation Action  premod    Electrical Stimulation Parameters  80-150hz  x 15 mins    Electrical Stimulation Goals  Pain      Manual Therapy   Passive ROM  passive HS stretching and gentle nerve glides attempted RT LE, but fairly uncomfotable for Pt  again.                  PT Long Term Goals - 12/04/19 1325      PT LONG TERM GOAL #1   Title  Independent with a HEP.    Time  6    Period  Weeks    Status  On-going      PT LONG  TERM GOAL #2   Title  Increase right ankle dorsiflexion to 6- 8 degrees to normalize the patient's gait pattern.    Time  6    Period  Weeks    Status  Achieved      PT LONG TERM GOAL #3   Title  Right LE strength 4+/5.    Time  6    Period  Weeks    Status  On-going      PT LONG TERM GOAL #4   Title  Walk 250 feet with normal dorsiflexion.    Time  6    Period  Weeks    Status  On-going      PT LONG TERM GOAL #5   Title  Perform a reciprocating stair gait with one railing.    Time  6    Period  Weeks    Status  On-going            Plan - 12/04/19 1308    Clinical Impression Statement  Pt arrived today doing fair, but still with strength deficits in RT LE. His DF ROM has improved to 10 degrees and LTG was met. His RT DF MMT 3/5 and LT 4/5. and unable to meet LTG. Pt also unable to descend steps due to RT LE weakness.. Pt was guided  through proprioception and strengthening  and did well.    Personal Factors and Comorbidities  Comorbidity 1;Comorbidity 2    Comorbidities  HTN, DM, 2 previous spinal surgeries.    Examination-Activity Limitations  Locomotion Level;Other;Sleep    Stability/Clinical Decision Making  Evolving/Moderate complexity    Rehab Potential  Good    PT Frequency  2x / week    PT Duration  6 weeks     PT Treatment/Interventions  ADLs/Self Care Home Management;Cryotherapy;Electrical Stimulation;Gait training;Ultrasound;Moist Heat;Stair training;Functional mobility training;Therapeutic activities;Therapeutic exercise;Neuromuscular re-education;Patient/family education;Passive range of motion;Manual techniques    PT Next Visit Plan  cont with Core exercise progression.  Please work on LE strengthening especially right dorsiflexion.   To MD 12-05-18    Consulted and Agree with Plan of Care  Patient       Patient will benefit from skilled therapeutic intervention in order to improve the following deficits and impairments:  Pain, Decreased activity tolerance, Abnormal gait, Decreased strength  Visit Diagnosis: Muscle weakness (generalized)     Problem List Patient Active Problem List   Diagnosis Date Noted  . DJD (degenerative joint disease) 09/24/2019  . Dizziness 09/23/2019  . HTN (hypertension) 09/23/2019  . HLD (hyperlipidemia) 09/23/2019  . CAD (coronary artery disease) 09/23/2019  . DM2 (diabetes mellitus, type 2) ([place]) 09/23/2019    [name],[name], PTA 12/04/2019, 5:40 PM  [facility] [address] [place], [place], [address] Phone: [phone]   Fax:  [phone]  Name: Tim Torres MRN: [id number] Date of Birth: 05-31-1951

## 2019-12-05 ENCOUNTER — Other Ambulatory Visit: Payer: Self-pay

## 2019-12-05 ENCOUNTER — Ambulatory Visit: Payer: Medicare HMO | Admitting: Physical Therapy

## 2019-12-05 DIAGNOSIS — M6281 Muscle weakness (generalized): Secondary | ICD-10-CM

## 2019-12-05 NOTE — Therapy (Addendum)
[facility] [place], [place], [address] Phone: [phone]   Fax:  [phone]  Physical Therapy Treatment  Patient Details  Name: Tim Torres MRN: [id number] Date of Birth: 11/08/1951 Referring Provider (PT): Earnie Larsson MD.   Encounter Date: 12/05/2019  PT End of Session - 12/05/19 1351    Visit Number  14    Number of Visits  14    Date for PT Re-Evaluation  12/10/19    Authorization Type  FOTO AT LEAST EVERY 5TH VISIT.  PROGRESS NOTE AT 10TH VISIT.   10th visit FOTO score 53% limitation    PT Start Time  0100    PT Stop Time  0150    PT Time Calculation (min)  50 min    Activity Tolerance  Patient tolerated treatment well    Behavior During Therapy  WFL for tasks assessed/performed       Past Medical History:  Diagnosis Date  . COVID-19   . Diabetes mellitus without complication ([place])   . Hypertension     Past Surgical History:  Procedure Laterality Date  . BACK SURGERY      There were no vitals filed for this visit.  Subjective Assessment - 12/05/19 1321    Subjective  COVID-19 screen performed prior to patient entering clinic.  To doctor tomorrow.    Pertinent History  HTN, DM, 2 previous spinal surgeries.    How long can you walk comfortably?  Short community distances.    Diagnostic tests  MRI.    Patient Stated Goals  Be outdoors.  Hunt.    Currently in Pain?  Yes    Pain Score  4     Pain Location  Leg    Pain Orientation  Right    Pain Descriptors / Indicators  Aching;Numbness    Pain Type  Chronic pain    Pain Onset  More than a month ago                       [facility] Adult PT Treatment/Exercise - 12/05/19 0001      Exercises   Exercises  Knee/Hip      Lumbar Exercises: Aerobic   Nustep  level 5 x 20 minutes      Knee/Hip Exercises: Machines for Strengthening   Cybex Knee Extension  10# x 2 minutes.    Cybex Knee Flexion  30# x 2 minutes.      Modalities   Modalities  Electrical Stimulation;Moist Heat      Moist Heat Therapy   Number Minutes Moist Heat  20 Minutes    Moist Heat Location  Lumbar Spine      Electrical Stimulation   Electrical Stimulation Location  LB    Electrical Stimulation Action  Pre-mod.    Electrical Stimulation Parameters  80-150 Hz x 20 minutes.    Electrical Stimulation Goals  Pain      Ankle Exercises: Standing   Rocker Board  3 minutes                  PT Long Term Goals - 12/04/19 1325      PT LONG TERM GOAL #1   Title  Independent with a HEP.    Time  6    Period  Weeks    Status  On-going      PT LONG TERM GOAL #2   Title  Increase right ankle dorsiflexion to 6- 8 degrees to normalize  the patient's gait pattern.    Time  6    Period  Weeks    Status  Achieved      PT LONG TERM GOAL #3   Title  Right LE strength 4+/5.    Time  6    Period  Weeks    Status  On-going      PT LONG TERM GOAL #4   Title  Walk 250 feet with normal dorsiflexion.    Time  6    Period  Weeks    Status  On-going      PT LONG TERM GOAL #5   Title  Perform a reciprocating stair gait with one railing.    Time  6    Period  Weeks    Status  On-going            Plan - 12/05/19 1344    Clinical Impression Statement  Patient with continued strength deficits in right LE.    Personal Factors and Comorbidities  Comorbidity 1;Comorbidity 2    Comorbidities  HTN, DM, 2 previous spinal surgeries.    Examination-Activity Limitations  Locomotion Level;Other;Sleep    Stability/Clinical Decision Making  Evolving/Moderate complexity    Rehab Potential  Good    PT Frequency  2x / week    PT Duration  6 weeks    PT Treatment/Interventions  ADLs/Self Care Home Management;Cryotherapy;Electrical Stimulation;Gait training;Ultrasound;Moist Heat;Stair training;Functional mobility training;Therapeutic activities;Therapeutic exercise;Neuromuscular re-education;Patient/family education;Passive range of motion;Manual  techniques    PT Next Visit Plan  cont with Core exercise progression.  Please work on LE strengthening especially right dorsiflexion.   To MD 12-05-18    Consulted and Agree with Plan of Care  Patient       Patient will benefit from skilled therapeutic intervention in order to improve the following deficits and impairments:  Pain, Decreased activity tolerance, Abnormal gait, Decreased strength  Visit Diagnosis: Muscle weakness (generalized) - Plan: PT plan of care cert/re-cert     Problem List Patient Active Problem List   Diagnosis Date Noted  . DJD (degenerative joint disease) 09/24/2019  . Dizziness 09/23/2019  . HTN (hypertension) 09/23/2019  . HLD (hyperlipidemia) 09/23/2019  . CAD (coronary artery disease) 09/23/2019  . DM2 (diabetes mellitus, type 2) (HCC) 09/23/2019    Traveon Louro, [place] MPT 12/05/2019, 3:38 PM  [facility] [address] [place], [place], [address] Phone: [phone]   Fax:  [phone]  Name: KEVONTA PHARISS MRN: [id number] Date of Birth: 26-Sep-1951

## 2019-12-05 NOTE — Addendum Note (Signed)
Addended by: Remmington Teters, [place] W on: 12/05/2019 03:38 PM   Modules accepted: Orders

## 2019-12-11 ENCOUNTER — Ambulatory Visit: Payer: Medicare HMO | Admitting: Physical Therapy

## 2019-12-11 ENCOUNTER — Encounter: Payer: Self-pay | Admitting: Physical Therapy

## 2019-12-11 ENCOUNTER — Other Ambulatory Visit: Payer: Self-pay

## 2019-12-11 DIAGNOSIS — M6281 Muscle weakness (generalized): Secondary | ICD-10-CM

## 2019-12-11 NOTE — Therapy (Signed)
[facility] [address] [place], [place], [address] Phone: [phone]   Fax:  [phone]  Physical Therapy Treatment  Patient Details  Name: Tim Torres MRN: [id number] Date of Birth: 1951/05/11 Referring Provider (PT): Julio Sicks MD.   Encounter Date: 12/11/2019  PT End of Session - 12/11/19 1512    Visit Number  15    Number of Visits  24    Date for PT Re-Evaluation  12/10/19    Authorization Type  FOTO AT LEAST EVERY 5TH VISIT.  PROGRESS NOTE AT 10TH VISIT.   10th visit FOTO score 53% limitation    PT Start Time  1430    PT Stop Time  1519    PT Time Calculation (min)  49 min    Activity Tolerance  Patient tolerated treatment well    Behavior During Therapy  WFL for tasks assessed/performed       Past Medical History:  Diagnosis Date  . COVID-19   . Diabetes mellitus without complication (HCC)   . Hypertension     Past Surgical History:  Procedure Laterality Date  . BACK SURGERY      There were no vitals filed for this visit.  Subjective Assessment - 12/11/19 1434    Subjective  COVID-19 screen performed prior to patient entering clinic.  Report he fell yesterday and broke his right 5th finger. Reports pain is ongoing with no change.    Pertinent History  HTN, DM, 2 previous spinal surgeries.    How long can you walk comfortably?  Short community distances.    Diagnostic tests  MRI.    Patient Stated Goals  Be outdoors.  Hunt.    Currently in Pain?  Yes    Pain Score  4     Pain Location  Leg    Pain Orientation  Right;Left         [facility] PT Assessment - 12/11/19 0001      Assessment   Medical Diagnosis  Radiculopathy, Lumbar.    Referring Provider (PT)  Julio Sicks MD.    Onset Date/Surgical Date  08/08/19    Next MD Visit  January 16, 2020      Precautions   Precautions  Fall      Observation/Other Assessments   Focus on Therapeutic Outcomes (FOTO)   63% limited                    [facility] Adult PT Treatment/Exercise - 12/11/19 0001      Exercises   Exercises  Knee/Hip      Lumbar Exercises: Aerobic   Nustep  level 5 x 20 minutes      Knee/Hip Exercises: Standing   Forward Step Up  Both;2 sets;10 reps;Hand Hold: 2;Hand Hold: 0;Step Height: 6"    Rocker Board  5 minutes      Modalities   Modalities  Electrical Stimulation;Moist Heat      Moist Heat Therapy   Number Minutes Moist Heat  15 Minutes    Moist Heat Location  Lumbar Spine      Electrical Stimulation   Electrical Stimulation Location  LB    Electrical Stimulation Action  pre-mod    Electrical Stimulation Parameters  80-150 hz x15 mins    Electrical Stimulation Goals  Pain                  PT Long Term Goals - 12/04/19 1325      PT LONG TERM GOAL #  1   Title  Independent with a HEP.    Time  6    Period  Weeks    Status  On-going      PT LONG TERM GOAL #2   Title  Increase right ankle dorsiflexion to 6- 8 degrees to normalize the patient's gait pattern.    Time  6    Period  Weeks    Status  Achieved      PT LONG TERM GOAL #3   Title  Right LE strength 4+/5.    Time  6    Period  Weeks    Status  On-going      PT LONG TERM GOAL #4   Title  Walk 250 feet with normal dorsiflexion.    Time  6    Period  Weeks    Status  On-going      PT LONG TERM GOAL #5   Title  Perform a reciprocating stair gait with one railing.    Time  6    Period  Weeks    Status  On-going            Plan - 12/11/19 1731    Clinical Impression Statement  Patient arrives but with pain secondary to a fall that resulted in a right 5th finger fracture. Patient was able to tolerate 20 mins of nustep but with ongoing tightness and neurological symptoms in right hamstring. Patient and PT discussed continuing PT until next follow up per new order provided (see media). No adverse affects upon removal of modalities.    Personal Factors and Comorbidities  Comorbidity  1;Comorbidity 2    Comorbidities  HTN, DM, 2 previous spinal surgeries.    Examination-Activity Limitations  Locomotion Level;Other;Sleep    Stability/Clinical Decision Making  Evolving/Moderate complexity    Clinical Decision Making  Moderate    Rehab Potential  Good    PT Frequency  2x / week    PT Duration  6 weeks    PT Treatment/Interventions  ADLs/Self Care Home Management;Cryotherapy;Electrical Stimulation;Gait training;Ultrasound;Moist Heat;Stair training;Functional mobility training;Therapeutic activities;Therapeutic exercise;Neuromuscular re-education;Patient/family education;Passive range of motion;Manual techniques    PT Next Visit Plan  cont  POC per new signed order with Core exercise progression. as tolerated. Please work on LE strengthening especially right dorsiflexion.    Consulted and Agree with Plan of Care  Patient       Patient will benefit from skilled therapeutic intervention in order to improve the following deficits and impairments:  Pain, Decreased activity tolerance, Abnormal gait, Decreased strength  Visit Diagnosis: Muscle weakness (generalized)     Problem List Patient Active Problem List   Diagnosis Date Noted  . DJD (degenerative joint disease) 09/24/2019  . Dizziness 09/23/2019  . HTN (hypertension) 09/23/2019  . HLD (hyperlipidemia) 09/23/2019  . CAD (coronary artery disease) 09/23/2019  . DM2 (diabetes mellitus, type 2) ([place]) 09/23/2019    Gabriela Eves, PT, DPT 12/11/2019, 5:35 PM  [facility] [address] [place], [place], [address] Phone: [phone]   Fax:  [phone]  Name: Tim Torres MRN: [id number] Date of Birth: Oct 04, 1951

## 2019-12-13 ENCOUNTER — Ambulatory Visit: Payer: Medicare HMO | Admitting: Physical Therapy

## 2019-12-13 ENCOUNTER — Encounter: Payer: Self-pay | Admitting: Physical Therapy

## 2019-12-13 ENCOUNTER — Other Ambulatory Visit: Payer: Self-pay

## 2019-12-13 DIAGNOSIS — M6281 Muscle weakness (generalized): Secondary | ICD-10-CM

## 2019-12-13 NOTE — Therapy (Signed)
[facility] [place], [place], [address] Phone: [phone]   Fax:  [phone]  Physical Therapy Treatment  Patient Details  Name: Tim Torres MRN: [id number] Date of Birth: 1950/12/25 Referring Provider (PT): Earnie Larsson MD.   Encounter Date: 12/13/2019  PT End of Session - 12/13/19 1514    Visit Number  16    Number of Visits  24    Date for PT Re-Evaluation  12/10/19    Authorization Type  FOTO AT LEAST EVERY 5TH VISIT.  PROGRESS NOTE AT 10TH VISIT.   10th visit FOTO score 53% limitation    PT Start Time  1433    PT Stop Time  1524    PT Time Calculation (min)  51 min    Activity Tolerance  Patient tolerated treatment well    Behavior During Therapy  WFL for tasks assessed/performed       Past Medical History:  Diagnosis Date  . COVID-19   . Diabetes mellitus without complication ([place])   . Hypertension     Past Surgical History:  Procedure Laterality Date  . BACK SURGERY      There were no vitals filed for this visit.  Subjective Assessment - 12/13/19 1448    Subjective  COVID 19 screening performed on patient upon arrival. No improvement regarding LBP or RLE radiculopathy.    Pertinent History  HTN, DM, 2 previous spinal surgeries.    How long can you walk comfortably?  Short community distances.    Diagnostic tests  MRI.    Patient Stated Goals  Be outdoors.  Hunt.    Currently in Pain?  Yes    Pain Score  4     Pain Location  Leg    Pain Orientation  Right    Pain Descriptors / Indicators  Aching;Numbness    Pain Type  Chronic pain    Pain Onset  More than a month ago    Pain Frequency  Constant         [facility] PT Assessment - 12/13/19 0001      Assessment   Medical Diagnosis  Radiculopathy, Lumbar.    Referring Provider (PT)  Earnie Larsson MD.    Onset Date/Surgical Date  08/08/19    Next MD Visit  January 16, 2020      Precautions   Precautions  [facility] Adult PT Treatment/Exercise - 12/13/19 0001      Lumbar Exercises: Aerobic   Nustep  L6 x16 min      Lumbar Exercises: Seated   Long Arc Quad on Chair  AROM;Right;20 reps   continued stretch down RLE to R great toe     Lumbar Exercises: Supine   Bridge  15 reps      Knee/Hip Exercises: Standing   Forward Step Up  Both;2 sets;10 reps;Hand Hold: 2;Hand Hold: 0;Step Height: 6"    Rocker Board  5 minutes      Modalities   Modalities  Electrical Stimulation;Moist Heat      Moist Heat Therapy   Number Minutes Moist Heat  10 Minutes    Moist Heat Location  Lumbar Spine      Electrical Stimulation   Electrical Stimulation Location  B low back    Electrical Stimulation Action  Pre-Mod    Electrical Stimulation Parameters  80-150 hz x10 min    Electrical Stimulation  Goals  Pain                  PT Long Term Goals - 12/04/19 1325      PT LONG TERM GOAL #1   Title  Independent with a HEP.    Time  6    Period  Weeks    Status  On-going      PT LONG TERM GOAL #2   Title  Increase right ankle dorsiflexion to 6- 8 degrees to normalize the patient's gait pattern.    Time  6    Period  Weeks    Status  Achieved      PT LONG TERM GOAL #3   Title  Right LE strength 4+/5.    Time  6    Period  Weeks    Status  On-going      PT LONG TERM GOAL #4   Title  Walk 250 feet with normal dorsiflexion.    Time  6    Period  Weeks    Status  On-going      PT LONG TERM GOAL #5   Title  Perform a reciprocating stair gait with one railing.    Time  6    Period  Weeks    Status  On-going            Plan - 12/13/19 1518    Clinical Impression Statement  Patient continues to present with RLE pain and limitation with activity due to pain and weakness. Patient still experiencing increased RLE radiculopathy with any movement such as LAQ that elicits sciatic nerve stretching. No reports of any increased pain reported during supine bridging.  Normal modalities response noted following removal of the modalities.    Personal Factors and Comorbidities  Comorbidity 1;Comorbidity 2    Comorbidities  HTN, DM, 2 previous spinal surgeries.    Examination-Activity Limitations  Locomotion Level;Other;Sleep    Examination-Participation Restrictions  Other    Stability/Clinical Decision Making  Evolving/Moderate complexity    Rehab Potential  Good    PT Frequency  2x / week    PT Duration  6 weeks    PT Treatment/Interventions  ADLs/Self Care Home Management;Cryotherapy;Electrical Stimulation;Gait training;Ultrasound;Moist Heat;Stair training;Functional mobility training;Therapeutic activities;Therapeutic exercise;Neuromuscular re-education;Patient/family education;Passive range of motion;Manual techniques    PT Next Visit Plan  cont  POC per new signed order with Core exercise progression. as tolerated. Please work on LE strengthening especially right dorsiflexion.    Consulted and Agree with Plan of Care  Patient       Patient will benefit from skilled therapeutic intervention in order to improve the following deficits and impairments:  Pain, Decreased activity tolerance, Abnormal gait, Decreased strength  Visit Diagnosis: Muscle weakness (generalized)     Problem List Patient Active Problem List   Diagnosis Date Noted  . DJD (degenerative joint disease) 09/24/2019  . Dizziness 09/23/2019  . HTN (hypertension) 09/23/2019  . HLD (hyperlipidemia) 09/23/2019  . CAD (coronary artery disease) 09/23/2019  . DM2 (diabetes mellitus, type 2) (HCC) 09/23/2019    Marvell Fuller, PTA 12/13/2019, 3:41 PM  [facility] [address] [place], [place], [address] Phone: [phone]   Fax:  [phone]  Name: Tim Torres MRN: [id number] Date of Birth: [birth year]

## 2019-12-18 ENCOUNTER — Other Ambulatory Visit: Payer: Self-pay

## 2019-12-18 ENCOUNTER — Ambulatory Visit: Payer: Medicare HMO | Attending: Neurosurgery | Admitting: Physical Therapy

## 2019-12-18 DIAGNOSIS — M6281 Muscle weakness (generalized): Secondary | ICD-10-CM | POA: Diagnosis present

## 2019-12-18 NOTE — Therapy (Signed)
[facility] [address] [place], [place], [address] Phone: [phone]   Fax:  [phone]  Physical Therapy Treatment  Patient Details  Name: Tim Torres MRN: [id number] Date of Birth: 1951-04-01 Referring Provider (PT): Julio Sicks MD.   Encounter Date: 12/18/2019  PT End of Session - 12/18/19 1553    Visit Number  17    Number of Visits  24    Date for PT Re-Evaluation  12/10/19    Authorization Type  FOTO AT LEAST EVERY 5TH VISIT.  PROGRESS NOTE AT 10TH VISIT.   10th visit FOTO score 53% limitation    PT Start Time  0315    PT Stop Time  0403    PT Time Calculation (min)  48 min    Activity Tolerance  Patient tolerated treatment well    Behavior During Therapy  WFL for tasks assessed/performed       Past Medical History:  Diagnosis Date  . COVID-19   . Diabetes mellitus without complication (HCC)   . Hypertension     Past Surgical History:  Procedure Laterality Date  . BACK SURGERY      There were no vitals filed for this visit.  Subjective Assessment - 12/18/19 1550    Subjective  COVID-19 screen performed prior to patient entering clinic.  Been walking a lot today.    Pertinent History  HTN, DM, 2 previous spinal surgeries.    How long can you walk comfortably?  Short community distances.    Diagnostic tests  MRI.    Patient Stated Goals  Be outdoors.  Hunt.    Currently in Pain?  Yes    Pain Score  4     Pain Location  Leg    Pain Orientation  Right    Pain Descriptors / Indicators  Aching;Numbness    Pain Onset  More than a month ago                       [facility] Adult PT Treatment/Exercise - 12/18/19 0001      Exercises   Exercises  Knee/Hip      Lumbar Exercises: Aerobic   Nustep  Level 6 x 15 minutes.      Lumbar Exercises: Machines for Strengthening   Cybex Knee Extension  10# x 2 minutes.    Cybex Knee Flexion  20# x 2 minutes.      Lumbar Exercises: Standing   Other  Standing Lumbar Exercises  Blue XTS resisted walking 2 minutes forward and 2 minutes backward (4 minutes total).      Modalities   Modalities  Electrical Stimulation;Moist Heat      Moist Heat Therapy   Number Minutes Moist Heat  20 Minutes    Moist Heat Location  Shoulder;Lumbar Spine      Electrical Stimulation   Electrical Stimulation Location  Bilateral low back.    Electrical Stimulation Action  IFC    Electrical Stimulation Parameters  80-150 Hz x 20 minutes.    Electrical Stimulation Goals  Pain                  PT Long Term Goals - 12/04/19 1325      PT LONG TERM GOAL #1   Title  Independent with a HEP.    Time  6    Period  Weeks    Status  On-going      PT LONG TERM GOAL #2   Title  Increase right ankle dorsiflexion to 6- 8 degrees to normalize the patient's gait pattern.    Time  6    Period  Weeks    Status  Achieved      PT LONG TERM GOAL #3   Title  Right LE strength 4+/5.    Time  6    Period  Weeks    Status  On-going      PT LONG TERM GOAL #4   Title  Walk 250 feet with normal dorsiflexion.    Time  6    Period  Weeks    Status  On-going      PT LONG TERM GOAL #5   Title  Perform a reciprocating stair gait with one railing.    Time  6    Period  Weeks    Status  On-going            Plan - 12/18/19 1553    Clinical Impression Statement  Patient did well today in spite of being tired from a lot of walking today which included in the woods.  He said he used his cane.  He did well with resisted walking and CGA.  LOB x 1 but patient self-corrected.    Personal Factors and Comorbidities  Comorbidity 1;Comorbidity 2    Comorbidities  HTN, DM, 2 previous spinal surgeries.    Examination-Activity Limitations  Locomotion Level;Other;Sleep    Examination-Participation Restrictions  Other    Stability/Clinical Decision Making  Evolving/Moderate complexity    Rehab Potential  Good    PT Frequency  2x / week    PT Duration  6 weeks    PT  Treatment/Interventions  ADLs/Self Care Home Management;Cryotherapy;Electrical Stimulation;Gait training;Ultrasound;Moist Heat;Stair training;Functional mobility training;Therapeutic activities;Therapeutic exercise;Neuromuscular re-education;Patient/family education;Passive range of motion;Manual techniques    PT Next Visit Plan  cont  POC per new signed order with Core exercise progression. as tolerated. Please work on LE strengthening especially right dorsiflexion.    Consulted and Agree with Plan of Care  Patient       Patient will benefit from skilled therapeutic intervention in order to improve the following deficits and impairments:  Pain, Decreased activity tolerance, Abnormal gait, Decreased strength  Visit Diagnosis: Muscle weakness (generalized)     Problem List Patient Active Problem List   Diagnosis Date Noted  . DJD (degenerative joint disease) 09/24/2019  . Dizziness 09/23/2019  . HTN (hypertension) 09/23/2019  . HLD (hyperlipidemia) 09/23/2019  . CAD (coronary artery disease) 09/23/2019  . DM2 (diabetes mellitus, type 2) ([place]) 09/23/2019    Aaryn Sermon, [place] MPT 12/18/2019, 4:04 PM  [facility] [address] [place], [place], [address] Phone: [phone]   Fax:  [phone]  Name: Tim Torres MRN: [id number] Date of Birth: 1951-06-18

## 2019-12-20 ENCOUNTER — Encounter: Payer: Self-pay | Admitting: Physical Therapy

## 2019-12-20 ENCOUNTER — Other Ambulatory Visit: Payer: Self-pay

## 2019-12-20 ENCOUNTER — Ambulatory Visit: Payer: Medicare HMO | Admitting: Physical Therapy

## 2019-12-20 DIAGNOSIS — M6281 Muscle weakness (generalized): Secondary | ICD-10-CM

## 2019-12-20 NOTE — Therapy (Signed)
[facility] [place], [place], [address] Phone: [phone]   Fax:  [phone]  Physical Therapy Treatment  Patient Details  Name: Tim Torres MRN: [id number] Date of Birth: 08/21/1951 Referring Provider (PT): Earnie Larsson MD.   Encounter Date: 12/20/2019  PT End of Session - 12/20/19 1436    Visit Number  18    Number of Visits  24    Date for PT Re-Evaluation  12/10/19    Authorization Type  FOTO AT LEAST EVERY 5TH VISIT.  PROGRESS NOTE AT 10TH VISIT.   10th visit FOTO score 53% limitation    PT Start Time  0203    PT Stop Time  0247    PT Time Calculation (min)  44 min    Activity Tolerance  Patient tolerated treatment well    Behavior During Therapy  [facility] for tasks assessed/performed       Past Medical History:  Diagnosis Date  . COVID-19   . Diabetes mellitus without complication ([place])   . Hypertension     Past Surgical History:  Procedure Laterality Date  . BACK SURGERY      There were no vitals filed for this visit.  Subjective Assessment - 12/20/19 1406    Subjective  COVID-19 screen performed prior to patient entering clinic.  No new complaints reported    Pertinent History  HTN, DM, 2 previous spinal surgeries.    How long can you walk comfortably?  Short community distances.    Diagnostic tests  MRI.    Patient Stated Goals  Be outdoors.  Hunt.    Currently in Pain?  Yes    Pain Score  4     Pain Location  Leg    Pain Orientation  Right    Pain Descriptors / Indicators  Aching;Numbness    Pain Type  Chronic pain    Pain Onset  More than a month ago    Pain Frequency  Constant    Aggravating Factors   prolong standing activity    Pain Relieving Factors  rest                       [facility] Adult PT Treatment/Exercise - 12/20/19 0001      Lumbar Exercises: Aerobic   Nustep  Level 6 x 15 minutes.      Lumbar Exercises: Machines for Strengthening   Cybex Knee Extension  10#  38mn    Cybex Knee Flexion  20# x382m      Lumbar Exercises: Standing   Other Standing Lumbar Exercises  Blue XTS resisted walking 2 minutes forward and 2 minutes backward (4 minutes total).      Moist Heat Therapy   Number Minutes Moist Heat  15 Minutes    Moist Heat Location  Lumbar Spine      Electrical Stimulation   Electrical Stimulation Location  Bilateral low back.    Electrical Stimulation Action  IFC    Electrical Stimulation Parameters  80-150hz x1558m   Electrical Stimulation Goals  Pain                  PT Long Term Goals - 12/20/19 1414      PT LONG TERM GOAL #1   Title  Independent with a HEP.    Time  6    Period  Weeks    Status  On-going      PT LONG TERM GOAL #2  Title  Increase right ankle dorsiflexion to 6- 8 degrees to normalize the patient's gait pattern.    Time  6    Period  Weeks    Status  Achieved      PT LONG TERM GOAL #3   Title  Right LE strength 4+/5.    Time  6    Period  Weeks    Status  On-going      PT LONG TERM GOAL #4   Title  Walk 250 feet with normal dorsiflexion.    Time  6    Period  Weeks    Status  Achieved   able to walk 250 and more  12/20/19     PT LONG TERM GOAL #5   Title  Perform a reciprocating stair gait with one railing.    Time  6    Period  Weeks    Status  On-going   unable to perform at this time 12/20/19           Plan - 12/20/19 1438    Clinical Impression Statement  Patient tolerated treatment well today. Patient has improved with DF and ability to walk a long distance with good form. Patient continues to have pain in LE and difficulty with stairs at this time. Patient met goal today with remaining ongoing due to pain and strength deficts.    Personal Factors and Comorbidities  Comorbidity 1;Comorbidity 2    Comorbidities  HTN, DM, 2 previous spinal surgeries.    Examination-Activity Limitations  Locomotion Level;Other;Sleep    Examination-Participation Restrictions  Other     Stability/Clinical Decision Making  Evolving/Moderate complexity    Rehab Potential  Good    PT Frequency  2x / week    PT Duration  6 weeks    PT Treatment/Interventions  ADLs/Self Care Home Management;Cryotherapy;Electrical Stimulation;Gait training;Ultrasound;Moist Heat;Stair training;Functional mobility training;Therapeutic activities;Therapeutic exercise;Neuromuscular re-education;Patient/family education;Passive range of motion;Manual techniques    PT Next Visit Plan  cont  POC with Core exercise progression. as tolerated. Please work on LE strengthening especially right dorsiflexion.    Consulted and Agree with Plan of Care  Patient       Patient will benefit from skilled therapeutic intervention in order to improve the following deficits and impairments:  Pain, Decreased activity tolerance, Abnormal gait, Decreased strength  Visit Diagnosis: Muscle weakness (generalized)     Problem List Patient Active Problem List   Diagnosis Date Noted  . DJD (degenerative joint disease) 09/24/2019  . Dizziness 09/23/2019  . HTN (hypertension) 09/23/2019  . HLD (hyperlipidemia) 09/23/2019  . CAD (coronary artery disease) 09/23/2019  . DM2 (diabetes mellitus, type 2) ([place]) 09/23/2019    Lache Dagher P, PTA 12/20/2019, 2:51 PM  [facility] [place], [place], [address] Phone: [phone]   Fax:  [phone]  Name: Tim Torres MRN: [id number] Date of Birth: [birth year]

## 2019-12-25 ENCOUNTER — Ambulatory Visit: Payer: Medicare HMO | Admitting: Physical Therapy

## 2019-12-25 ENCOUNTER — Encounter: Payer: Self-pay | Admitting: Physical Therapy

## 2019-12-25 ENCOUNTER — Other Ambulatory Visit: Payer: Self-pay

## 2019-12-25 DIAGNOSIS — M6281 Muscle weakness (generalized): Secondary | ICD-10-CM | POA: Diagnosis not present

## 2019-12-25 NOTE — Therapy (Signed)
[facility] [address] [place], [place], [address] Phone: [phone]   Fax:  [phone]  Physical Therapy Treatment  Patient Details  Name: Tim Torres MRN: [id number] Date of Birth: Dec 16, 1950 Referring Provider (PT): Julio Sicks MD.   Encounter Date: 12/25/2019  PT End of Session - 12/25/19 1529    Visit Number  19    Number of Visits  24    Date for PT Re-Evaluation  12/10/19    Authorization Type  FOTO AT LEAST EVERY 5TH VISIT.  PROGRESS NOTE AT 10TH VISIT.   10th visit FOTO score 53% limitation    PT Start Time  1517    PT Stop Time  1602    PT Time Calculation (min)  45 min    Activity Tolerance  Patient tolerated treatment well    Behavior During Therapy  WFL for tasks assessed/performed       Past Medical History:  Diagnosis Date  . COVID-19   . Diabetes mellitus without complication ([facility])   . Hypertension     Past Surgical History:  Procedure Laterality Date  . BACK SURGERY      There were no vitals filed for this visit.  Subjective Assessment - 12/25/19 1518    Subjective  COVID-19 screen performed prior to patient entering clinic. Reports that he is sore in LEs from doing activites all day.    Pertinent History  HTN, DM, 2 previous spinal surgeries.    How long can you walk comfortably?  Short community distances.    Diagnostic tests  MRI.    Patient Stated Goals  Be outdoors.  Hunt.    Currently in Pain?  Yes    Pain Score  --   "its pretty bad"   Pain Location  Leg    Pain Orientation  Left;Right    Pain Descriptors / Indicators  Sore;Cramping    Pain Type  Chronic pain    Pain Onset  More than a month ago    Pain Frequency  Constant         [facility] PT Assessment - 12/25/19 0001      Assessment   Medical Diagnosis  Radiculopathy, Lumbar.    Referring Provider (PT)  Julio Sicks MD.    Onset Date/Surgical Date  08/08/19    Next MD Visit  01/16/2020      Precautions   Precautions  Fall                    [facility] Adult PT Treatment/Exercise - 12/25/19 0001      Lumbar Exercises: Aerobic   Recumbent Bike  seat 12 x8 min      Lumbar Exercises: Machines for Strengthening   Cybex Knee Extension  10# 3x10 reps    Cybex Knee Flexion  30# 3x10 reps      Lumbar Exercises: Supine   Clam  20 reps;3 seconds;Limitations    Clam Limitations  green theraband    Bridge with clamshell  20 reps;Limitations;5 seconds    Bridge with [employer] Limitations  green theraband    Straight Leg Raise  10 reps;2 seconds      Knee/Hip Exercises: Standing   Rocker Board  4 minutes      Modalities   Modalities  Electrical Stimulation;Moist Heat      Moist Heat Therapy   Number Minutes Moist Heat  15 Minutes    Moist Heat Location  Lumbar Spine      Electrical  Stimulation   Electrical Stimulation Location  Bilateral low back.    Electrical Stimulation Action  Pre-Mod    Electrical Stimulation Parameters  80-150 hz x15 min    Electrical Stimulation Goals  Pain                  PT Long Term Goals - 12/20/19 1414      PT LONG TERM GOAL #1   Title  Independent with a HEP.    Time  6    Period  Weeks    Status  On-going      PT LONG TERM GOAL #2   Title  Increase right ankle dorsiflexion to 6- 8 degrees to normalize the patient's gait pattern.    Time  6    Period  Weeks    Status  Achieved      PT LONG TERM GOAL #3   Title  Right LE strength 4+/5.    Time  6    Period  Weeks    Status  On-going      PT LONG TERM GOAL #4   Title  Walk 250 feet with normal dorsiflexion.    Time  6    Period  Weeks    Status  Achieved   able to walk 250 and more  12/20/19     PT LONG TERM GOAL #5   Title  Perform a reciprocating stair gait with one railing.    Time  6    Period  Weeks    Status  On-going   unable to perform at this time 12/20/19           Plan - 12/25/19 1609    Clinical Impression Statement  Patient presented in clinic with reports of LE  weakness from walking this morning. Patient reported LE cramping during any strengthening such as knee extension and HS curls as well as SLRs. Normal modalities response noted following removal of the modalities. Patient noted discomfort from knees to feet following end of treatment.    Personal Factors and Comorbidities  Comorbidity 1;Comorbidity 2    Comorbidities  HTN, DM, 2 previous spinal surgeries.    Examination-Activity Limitations  Locomotion Level;Other;Sleep    Examination-Participation Restrictions  Other    Stability/Clinical Decision Making  Evolving/Moderate complexity    Rehab Potential  Good    PT Frequency  2x / week    PT Duration  6 weeks    PT Treatment/Interventions  ADLs/Self Care Home Management;Cryotherapy;Electrical Stimulation;Gait training;Ultrasound;Moist Heat;Stair training;Functional mobility training;Therapeutic activities;Therapeutic exercise;Neuromuscular re-education;Patient/family education;Passive range of motion;Manual techniques    PT Next Visit Plan  cont  POC with Core exercise progression. as tolerated. Please work on LE strengthening especially right dorsiflexion.    Consulted and Agree with Plan of Care  Patient       Patient will benefit from skilled therapeutic intervention in order to improve the following deficits and impairments:  Pain, Decreased activity tolerance, Abnormal gait, Decreased strength  Visit Diagnosis: Muscle weakness (generalized)     Problem List Patient Active Problem List   Diagnosis Date Noted  . DJD (degenerative joint disease) 09/24/2019  . Dizziness 09/23/2019  . HTN (hypertension) 09/23/2019  . HLD (hyperlipidemia) 09/23/2019  . CAD (coronary artery disease) 09/23/2019  . DM2 (diabetes mellitus, type 2) ([facility]) 09/23/2019    Marvell Fuller, PTA 12/25/2019, 4:13 PM  [facility] [address] [place], [place], [address] Phone: [phone]   Fax:  [phone]  Name:  Tim Torres MRN: [id number] Date of Birth: Apr 29, 1951

## 2019-12-27 ENCOUNTER — Other Ambulatory Visit: Payer: Self-pay

## 2019-12-27 ENCOUNTER — Ambulatory Visit: Payer: Medicare HMO | Admitting: Physical Therapy

## 2019-12-27 DIAGNOSIS — M6281 Muscle weakness (generalized): Secondary | ICD-10-CM

## 2019-12-27 NOTE — Therapy (Signed)
[facility] [place], [place], [address] Phone: [phone]   Fax:  [phone]  Physical Therapy Treatment  Patient Details  Name: Tim Torres MRN: [id number] Date of Birth: 06-24-1951 Referring Provider (PT): Earnie Larsson MD.   Encounter Date: 12/27/2019  PT End of Session - 12/27/19 1619    Visit Number  20    Number of Visits  24    Date for PT Re-Evaluation  12/10/19    Authorization Type  FOTO AT LEAST EVERY 5TH VISIT.  PROGRESS NOTE AT 10TH VISIT.   10th visit FOTO score 53% limitation    PT Start Time  0315    PT Stop Time  0408    PT Time Calculation (min)  53 min    Activity Tolerance  Patient tolerated treatment well    Behavior During Therapy  WFL for tasks assessed/performed       Past Medical History:  Diagnosis Date  . COVID-19   . Diabetes mellitus without complication ([place])   . Hypertension     Past Surgical History:  Procedure Laterality Date  . BACK SURGERY      There were no vitals filed for this visit.  Subjective Assessment - 12/27/19 1608    Subjective  COVID-19 screen performed prior to patient entering clinic.  Been doing a lot today.  I'm worn out.  Want to go easy.    Pertinent History  HTN, DM, 2 previous spinal surgeries.    How long can you walk comfortably?  Short community distances.    Diagnostic tests  MRI.    Patient Stated Goals  Be outdoors.  Hunt.    Currently in Pain?  Yes    Pain Score  6     Pain Location  --   Back and legs.   Pain Orientation  Right;Left    Pain Descriptors / Indicators  Aching;Sore;Cramping    Pain Onset  More than a month ago                       [facility] Adult PT Treatment/Exercise - 12/27/19 0001      Exercises   Exercises  Knee/Hip      Lumbar Exercises: Aerobic   Nustep  Level 3 x 15 minutes.      Modalities   Modalities  Electrical Stimulation;Moist Heat;Ultrasound      Moist Heat Therapy   Number Minutes  Moist Heat  20 Minutes    Moist Heat Location  Lumbar Spine      Electrical Stimulation   Electrical Stimulation Location  Bilateral low back.    Electrical Stimulation Action  IFC    Electrical Stimulation Parameters  80-150 Hz x 20 minutes.    Electrical Stimulation Goals  Pain      Ultrasound   Ultrasound Location  Low back.    Ultrasound Parameters  U/S at 1.50 w/CM2 x 9 minutes.    Ultrasound Goals  Pain                  PT Long Term Goals - 12/20/19 1414      PT LONG TERM GOAL #1   Title  Independent with a HEP.    Time  6    Period  Weeks    Status  On-going      PT LONG TERM GOAL #2   Title  Increase right ankle dorsiflexion to 6- 8 degrees to normalize the patient's gait  pattern.    Time  6    Period  Weeks    Status  Achieved      PT LONG TERM GOAL #3   Title  Right LE strength 4+/5.    Time  6    Period  Weeks    Status  On-going      PT LONG TERM GOAL #4   Title  Walk 250 feet with normal dorsiflexion.    Time  6    Period  Weeks    Status  Achieved   able to walk 250 and more  12/20/19     PT LONG TERM GOAL #5   Title  Perform a reciprocating stair gait with one railing.    Time  6    Period  Weeks    Status  On-going   unable to perform at this time 12/20/19           Plan - 12/27/19 1617    Clinical Impression Statement  Patient worn out today.  Requested easier treatment today.  He is very active outside of therapy and feels he overdid it recently.  He felt better afetr treatment today.    Personal Factors and Comorbidities  Comorbidity 1;Comorbidity 2    Comorbidities  HTN, DM, 2 previous spinal surgeries.    Examination-Activity Limitations  Locomotion Level;Other;Sleep    Examination-Participation Restrictions  Other    Stability/Clinical Decision Making  Evolving/Moderate complexity    Rehab Potential  Good    PT Frequency  2x / week    PT Duration  6 weeks    PT Treatment/Interventions  ADLs/Self Care Home  Management;Cryotherapy;Electrical Stimulation;Gait training;Ultrasound;Moist Heat;Stair training;Functional mobility training;Therapeutic activities;Therapeutic exercise;Neuromuscular re-education;Patient/family education;Passive range of motion;Manual techniques    PT Next Visit Plan  cont  POC with Core exercise progression. as tolerated. Please work on LE strengthening especially right dorsiflexion.    Consulted and Agree with Plan of Care  Patient       Patient will benefit from skilled therapeutic intervention in order to improve the following deficits and impairments:  Pain, Decreased activity tolerance, Abnormal gait, Decreased strength  Visit Diagnosis: Muscle weakness (generalized)     Problem List Patient Active Problem List   Diagnosis Date Noted  . DJD (degenerative joint disease) 09/24/2019  . Dizziness 09/23/2019  . HTN (hypertension) 09/23/2019  . HLD (hyperlipidemia) 09/23/2019  . CAD (coronary artery disease) 09/23/2019  . DM2 (diabetes mellitus, type 2) ([place]) 09/23/2019   Progress Note Reporting Period 10/01/19 to 12/27/19  See note below for Objective Data and Assessment of Progress/Goals.  Patient has met LTG's #2 and #4.     Danielle Lento, [place] MPT 12/27/2019, 4:21 PM  [facility] [address] [place], [place], [address] Phone: [phone]   Fax:  [phone]  Name: SHERIF MILLSPAUGH MRN: [id number] Date of Birth: 1951/01/30

## 2020-01-01 ENCOUNTER — Ambulatory Visit: Payer: Medicare HMO | Admitting: Physical Therapy

## 2020-01-01 ENCOUNTER — Encounter: Payer: Self-pay | Admitting: Physical Therapy

## 2020-01-01 ENCOUNTER — Other Ambulatory Visit: Payer: Self-pay

## 2020-01-01 DIAGNOSIS — M6281 Muscle weakness (generalized): Secondary | ICD-10-CM

## 2020-01-01 NOTE — Therapy (Signed)
[facility] [place], [place], [address] Phone: [phone]   Fax:  [phone]  Physical Therapy Treatment  Patient Details  Name: Tim Torres MRN: [id number] Date of Birth: 1951-07-31 Referring Provider (PT): Earnie Larsson MD.   Encounter Date: 01/01/2020  PT End of Session - 01/01/20 1514    Visit Number  21    Number of Visits  24    Date for PT Re-Evaluation  12/10/19    Authorization Type  FOTO AT LEAST EVERY 5TH VISIT.  PROGRESS NOTE AT 10TH VISIT.   10th visit FOTO score 53% limitation    PT Start Time  1430    PT Stop Time  1520    PT Time Calculation (min)  50 min    Activity Tolerance  Patient tolerated treatment well    Behavior During Therapy  WFL for tasks assessed/performed       Past Medical History:  Diagnosis Date  . COVID-19   . Diabetes mellitus without complication ([place])   . Hypertension     Past Surgical History:  Procedure Laterality Date  . BACK SURGERY      There were no vitals filed for this visit.  Subjective Assessment - 01/01/20 1512    Subjective  COVID-19 screen performed prior to patient entering clinic. Reports his legs feel about the same.    Pertinent History  HTN, DM, 2 previous spinal surgeries.    How long can you walk comfortably?  Short community distances.    Diagnostic tests  MRI.    Patient Stated Goals  Be outdoors.  Hunt.    Currently in Pain?  Yes    Pain Location  Leg    Pain Orientation  Left;Right    Pain Descriptors / Indicators  Discomfort;Cramping    Pain Type  Chronic pain    Pain Onset  More than a month ago    Pain Frequency  Constant         [facility] PT Assessment - 01/01/20 0001      Assessment   Medical Diagnosis  Radiculopathy, Lumbar.    Referring Provider (PT)  Earnie Larsson MD.    Onset Date/Surgical Date  08/08/19    Next MD Visit  01/16/2020      Precautions   Precautions  Fall                   [facility] Adult PT  Treatment/Exercise - 01/01/20 0001      Knee/Hip Exercises: Aerobic   Nustep  L5 x16 min      Knee/Hip Exercises: Machines for Strengthening   Cybex Knee Extension  20# 3x10 reps    Cybex Knee Flexion  40# 3x10 reps      Knee/Hip Exercises: Standing   Rocker Board  5 minutes      Knee/Hip Exercises: Supine   Bridges with [employer]  Strengthening;20 reps      Modalities   Modalities  Electrical Stimulation;Moist Heat;Ultrasound      Moist Heat Therapy   Number Minutes Moist Heat  15 Minutes    Moist Heat Location  Lumbar Spine      Electrical Stimulation   Electrical Stimulation Location  Pre-Mod    Electrical Stimulation Action  Pre-Mod    Electrical Stimulation Parameters  80-150 hz x15 min    Electrical Stimulation Goals  Pain                  PT Long Term  Goals - 12/20/19 1414      PT LONG TERM GOAL #1   Title  Independent with a HEP.    Time  6    Period  Weeks    Status  On-going      PT LONG TERM GOAL #2   Title  Increase right ankle dorsiflexion to 6- 8 degrees to normalize the patient's gait pattern.    Time  6    Period  Weeks    Status  Achieved      PT LONG TERM GOAL #3   Title  Right LE strength 4+/5.    Time  6    Period  Weeks    Status  On-going      PT LONG TERM GOAL #4   Title  Walk 250 feet with normal dorsiflexion.    Time  6    Period  Weeks    Status  Achieved   able to walk 250 and more  12/20/19     PT LONG TERM GOAL #5   Title  Perform a reciprocating stair gait with one railing.    Time  6    Period  Weeks    Status  On-going   unable to perform at this time 12/20/19           Plan - 01/01/20 1516    Clinical Impression Statement  Patient presented in clinic with reports of continued LBP with radicular pain down BLE. Patient reported discomfort/strain in B quads during machine HS curls and weakness with knee extension. Normal modalities response noted following removal of the modalities.    Personal Factors  and Comorbidities  Comorbidity 1;Comorbidity 2    Comorbidities  HTN, DM, 2 previous spinal surgeries.    Examination-Activity Limitations  Locomotion Level;Other;Sleep    Examination-Participation Restrictions  Other    Stability/Clinical Decision Making  Evolving/Moderate complexity    Rehab Potential  Good    PT Frequency  2x / week    PT Duration  6 weeks    PT Treatment/Interventions  ADLs/Self Care Home Management;Cryotherapy;Electrical Stimulation;Gait training;Ultrasound;Moist Heat;Stair training;Functional mobility training;Therapeutic activities;Therapeutic exercise;Neuromuscular re-education;Patient/family education;Passive range of motion;Manual techniques    PT Next Visit Plan  cont  POC with Core exercise progression. as tolerated. Please work on LE strengthening especially right dorsiflexion.    Consulted and Agree with Plan of Care  Patient       Patient will benefit from skilled therapeutic intervention in order to improve the following deficits and impairments:  Pain, Decreased activity tolerance, Abnormal gait, Decreased strength  Visit Diagnosis: Muscle weakness (generalized)     Problem List Patient Active Problem List   Diagnosis Date Noted  . DJD (degenerative joint disease) 09/24/2019  . Dizziness 09/23/2019  . HTN (hypertension) 09/23/2019  . HLD (hyperlipidemia) 09/23/2019  . CAD (coronary artery disease) 09/23/2019  . DM2 (diabetes mellitus, type 2) ([facility]) 09/23/2019    Marvell Fuller, PTA 01/01/2020, 3:28 PM  [facility] [address] [place], [place], [address] Phone: [phone]   Fax:  [phone]  Name: Tim Torres MRN: [id number] Date of Birth: 1951/11/15

## 2020-01-03 ENCOUNTER — Encounter: Payer: Medicare HMO | Admitting: Physical Therapy

## 2020-01-08 ENCOUNTER — Ambulatory Visit: Payer: Medicare HMO | Admitting: *Deleted

## 2020-01-08 ENCOUNTER — Other Ambulatory Visit: Payer: Self-pay

## 2020-01-08 DIAGNOSIS — M6281 Muscle weakness (generalized): Secondary | ICD-10-CM

## 2020-01-08 NOTE — Therapy (Signed)
[facility] [place], [place], [address] Phone: [phone]   Fax:  [phone]  Physical Therapy Treatment  Patient Details  Name: Tim Torres MRN: [id number] Date of Birth: [birth year] Referring Provider (PT): Earnie Larsson MD.   Encounter Date: 01/08/2020  PT End of Session - 01/08/20 1512    Visit Number  22    Number of Visits  24    Date for PT Re-Evaluation  01/14/20    Authorization Type  FOTO AT LEAST EVERY 5TH VISIT.  PROGRESS NOTE AT 10TH VISIT.   10th visit FOTO score 53% limitation    PT Start Time  1430    PT Stop Time  1520    PT Time Calculation (min)  50 min       Past Medical History:  Diagnosis Date  . COVID-19   . Diabetes mellitus without complication ([place])   . Hypertension     Past Surgical History:  Procedure Laterality Date  . BACK SURGERY      There were no vitals filed for this visit.  Subjective Assessment - 01/08/20 1429    Subjective  COVID-19 screen performed prior to patient entering clinic. Reports his legs feel about the same. MD 01-17-20    Pertinent History  HTN, DM, 2 previous spinal surgeries.    How long can you walk comfortably?  Short community distances.    Diagnostic tests  MRI.    Patient Stated Goals  Be outdoors.  Hunt.    Currently in Pain?  Yes    Pain Score  4     Pain Location  Leg    Pain Orientation  Right;Left                       [facility] Adult PT Treatment/Exercise - 01/08/20 0001      Exercises   Exercises  Knee/Hip      Lumbar Exercises: Aerobic   Nustep  Level 5 x 15 minutes.  LEs only      Knee/Hip Exercises: Machines for Strengthening   Cybex Knee Extension  20# 3x10-15 reps    Cybex Knee Flexion  40# 3x10-15reps      Knee/Hip Exercises: Standing   Rocker Board  5 minutes      Knee/Hip Exercises: Supine   Bridges with Ball Squeeze  Strengthening;20 reps      Modalities   Modalities  Electrical Stimulation;Moist  Heat;Ultrasound      Moist Heat Therapy   Number Minutes Moist Heat  15 Minutes    Moist Heat Location  Lumbar Spine      Electrical Stimulation   Electrical Stimulation Location  Bil LB paras    Electrical Stimulation Action  IFC    Electrical Stimulation Parameters  80-150hz  x 15 mins    Electrical Stimulation Goals  Pain                  PT Long Term Goals - 12/20/19 1414      PT LONG TERM GOAL #1   Title  Independent with a HEP.    Time  6    Period  Weeks    Status  On-going      PT LONG TERM GOAL #2   Title  Increase right ankle dorsiflexion to 6- 8 degrees to normalize the patient's gait pattern.    Time  6    Period  Weeks    Status  Achieved  PT LONG TERM GOAL #3   Title  Right LE strength 4+/5.    Time  6    Period  Weeks    Status  On-going      PT LONG TERM GOAL #4   Title  Walk 250 feet with normal dorsiflexion.    Time  6    Period  Weeks    Status  Achieved   able to walk 250 and more  12/20/19     PT LONG TERM GOAL #5   Title  Perform a reciprocating stair gait with one railing.    Time  6    Period  Weeks    Status  On-going   unable to perform at this time 12/20/19           Plan - 01/08/20 1514    Clinical Impression Statement  Pt arrived today doing about the same with LE symptoms. He was guided through balance, core and LE strengthening exs with mainly mm fatigue and quad soreness today. Normal modality response today    Personal Factors and Comorbidities  Comorbidity 1;Comorbidity 2    Comorbidities  HTN, DM, 2 previous spinal surgeries.    Examination-Activity Limitations  Locomotion Level;Other;Sleep    Examination-Participation Restrictions  Other    Stability/Clinical Decision Making  Evolving/Moderate complexity    Rehab Potential  Good    PT Frequency  2x / week    PT Duration  6 weeks    PT Treatment/Interventions  ADLs/Self Care Home Management;Cryotherapy;Electrical Stimulation;Gait training;Ultrasound;Moist  Heat;Stair training;Functional mobility training;Therapeutic activities;Therapeutic exercise;Neuromuscular re-education;Patient/family education;Passive range of motion;Manual techniques    PT Next Visit Plan  cont  POC with Core exercise progression. as tolerated. Please work on LE strengthening especially right dorsiflexion.    Consulted and Agree with Plan of Care  Patient       Patient will benefit from skilled therapeutic intervention in order to improve the following deficits and impairments:  Pain, Decreased activity tolerance, Abnormal gait, Decreased strength  Visit Diagnosis: Muscle weakness (generalized)     Problem List Patient Active Problem List   Diagnosis Date Noted  . DJD (degenerative joint disease) 09/24/2019  . Dizziness 09/23/2019  . HTN (hypertension) 09/23/2019  . HLD (hyperlipidemia) 09/23/2019  . CAD (coronary artery disease) 09/23/2019  . DM2 (diabetes mellitus, type 2) (HCC) 09/23/2019    Hobson Lax,[name], PTA 01/08/2020, 4:50 PM  [facility] [address] [place], [place], [address] Phone: [phone]   Fax:  [phone]  Name: Tim Torres MRN: [id number] Date of Birth: Oct 21, 1951

## 2020-01-10 ENCOUNTER — Ambulatory Visit: Payer: Medicare HMO | Admitting: Physical Therapy

## 2020-01-15 ENCOUNTER — Other Ambulatory Visit: Payer: Self-pay

## 2020-01-15 ENCOUNTER — Ambulatory Visit: Payer: Medicare HMO | Attending: Neurosurgery | Admitting: Physical Therapy

## 2020-01-15 ENCOUNTER — Encounter: Payer: Self-pay | Admitting: Physical Therapy

## 2020-01-15 DIAGNOSIS — M6281 Muscle weakness (generalized): Secondary | ICD-10-CM | POA: Diagnosis not present

## 2020-01-15 NOTE — Therapy (Signed)
[facility] [place], [place], [address] Phone: [phone]   Fax:  [phone]  Physical Therapy Treatment  Patient Details  Name: Tim Torres MRN: [id number] Date of Birth: February 14, 1951 Referring Provider (PT): Earnie Larsson MD.   Encounter Date: 01/15/2020  PT End of Session - 01/15/20 1438    Visit Number  23    Number of Visits  24    Date for PT Re-Evaluation  01/14/20    Authorization Type  FOTO AT LEAST EVERY 5TH VISIT.  PROGRESS NOTE AT 10TH VISIT.   10th visit FOTO score 53% limitation    PT Start Time  1433    PT Stop Time  1522    PT Time Calculation (min)  49 min    Activity Tolerance  Patient tolerated treatment well    Behavior During Therapy  WFL for tasks assessed/performed       Past Medical History:  Diagnosis Date  . COVID-19   . Diabetes mellitus without complication ([place])   . Hypertension     Past Surgical History:  Procedure Laterality Date  . BACK SURGERY      There were no vitals filed for this visit.  Subjective Assessment - 01/15/20 1433    Subjective  COVID-19 screen performed prior to patient entering clinic. Reports he has greater pain and numbness at night. Numbness may have improved "a little bit."    Pertinent History  HTN, DM, 2 previous spinal surgeries.    How long can you walk comfortably?  Short community distances.    Diagnostic tests  MRI.    Patient Stated Goals  Be outdoors.  Hunt.    Currently in Pain?  Yes    Pain Score  3     Pain Location  Leg    Pain Orientation  Left;Right    Pain Descriptors / Indicators  Discomfort;Cramping    Pain Type  Chronic pain    Pain Onset  More than a month ago    Pain Frequency  Constant         [facility] PT Assessment - 01/15/20 0001      Assessment   Medical Diagnosis  Radiculopathy, Lumbar.    Referring Provider (PT)  Earnie Larsson MD.    Onset Date/Surgical Date  08/08/19    Next MD Visit  01/16/2020      Precautions    Precautions  Fall      ROM / Strength   AROM / PROM / Strength  Strength      Strength   Overall Strength  Deficits    Strength Assessment Site  Hip;Knee    Right/Left Hip  Right    Right Hip Flexion  3+/5    Right/Left Knee  Right    Right Knee Flexion  4/5    Right Knee Extension  4/5                   [facility] Adult PT Treatment/Exercise - 01/15/20 0001      Lumbar Exercises: Aerobic   Nustep  Level 6 x 15 minutes.  LEs only      Lumbar Exercises: Standing   Row  Strengthening;Both;20 reps;Theraband    Row Limitations  green XTS with core activation      Knee/Hip Exercises: Machines for Strengthening   Cybex Knee Extension  20# 3x10 reps    Cybex Knee Flexion  40# 3x10 reps      Knee/Hip Exercises: Standing  Rocker Board  5 minutes      Modalities   Modalities  Electrical Stimulation;Moist Heat      Moist Heat Therapy   Number Minutes Moist Heat  15 Minutes    Moist Heat Location  Lumbar Spine      Electrical Stimulation   Electrical Stimulation Location  Bil LB paras    Electrical Stimulation Action  IFC    Electrical Stimulation Parameters  80-150 hz x15 min    Electrical Stimulation Goals  Pain                  PT Long Term Goals - 01/15/20 1557      PT LONG TERM GOAL #1   Title  Independent with a HEP.    Time  6    Period  Weeks    Status  On-going      PT LONG TERM GOAL #2   Title  Increase right ankle dorsiflexion to 6- 8 degrees to normalize the patient's gait pattern.    Time  6    Period  Weeks    Status  Achieved      PT LONG TERM GOAL #3   Title  Right LE strength 4+/5.    Time  6    Period  Weeks    Status  Not Met      PT LONG TERM GOAL #4   Title  Walk 250 feet with normal dorsiflexion.    Time  6    Period  Weeks    Status  Achieved   able to walk 250 and more  12/20/19     PT LONG TERM GOAL #5   Title  Perform a reciprocating stair gait with one railing.    Time  6    Period  Weeks    Status  Partially  Met   Can depending on handrail, overall condition as of 01/15/2020.           Plan - 01/15/20 1520    Clinical Impression Statement  Patient presented in clinic with continued BLE radicular symptoms with numbness improving "a little bit" but pain and numbness worse at night. Patient unable to rest well at night on R hip due to discomfort. Muscle fatigue experienced along with quad soreness during therex. Weakness also noted in RLE with MMT testing. Normal modalities response noted following removal of the modalities.    Personal Factors and Comorbidities  Comorbidity 1;Comorbidity 2    Comorbidities  HTN, DM, 2 previous spinal surgeries.    Examination-Activity Limitations  Locomotion Level;Other;Sleep    Examination-Participation Restrictions  Other    Stability/Clinical Decision Making  Evolving/Moderate complexity    Rehab Potential  Good    PT Frequency  2x / week    PT Duration  6 weeks    PT Treatment/Interventions  ADLs/Self Care Home Management;Cryotherapy;Electrical Stimulation;Gait training;Ultrasound;Moist Heat;Stair training;Functional mobility training;Therapeutic activities;Therapeutic exercise;Neuromuscular re-education;Patient/family education;Passive range of motion;Manual techniques    PT Next Visit Plan  cont  POC with Core exercise progression. as tolerated. Please work on LE strengthening especially right dorsiflexion.    Consulted and Agree with Plan of Care  Patient       Patient will benefit from skilled therapeutic intervention in order to improve the following deficits and impairments:  Pain, Decreased activity tolerance, Abnormal gait, Decreased strength  Visit Diagnosis: Muscle weakness (generalized)     Problem List Patient Active Problem List   Diagnosis Date Noted  . DJD (degenerative joint  disease) 09/24/2019  . Dizziness 09/23/2019  . HTN (hypertension) 09/23/2019  . HLD (hyperlipidemia) 09/23/2019  . CAD (coronary artery disease) 09/23/2019   . DM2 (diabetes mellitus, type 2) ([place]) 09/23/2019    Standley Brooking, PTA 01/15/2020, 4:01 PM  [facility] [address] [place], [place], [address] Phone: [phone]   Fax:  [phone]  Name: Tim Torres MRN: [id number] Date of Birth: Oct 03, 1951

## 2020-01-17 ENCOUNTER — Other Ambulatory Visit: Payer: Self-pay

## 2020-01-17 ENCOUNTER — Ambulatory Visit: Payer: Medicare HMO | Admitting: *Deleted

## 2020-01-17 DIAGNOSIS — M6281 Muscle weakness (generalized): Secondary | ICD-10-CM | POA: Diagnosis not present

## 2020-01-17 NOTE — Therapy (Signed)
[facility] [place], [place], [address] Phone: [phone]   Fax:  [phone]  Physical Therapy Treatment  Patient Details  Name: Tim Torres MRN: [id number] Date of Birth: 1951/11/06 Referring Provider (PT): Earnie Larsson MD.   Encounter Date: 01/17/2020  PT End of Session - 01/17/20 1434    Visit Number  24    Number of Visits  24    Date for PT Re-Evaluation  01/14/20    Authorization Type  FOTO AT LEAST EVERY 5TH VISIT.  PROGRESS NOTE AT 10TH VISIT.   24th visit FOTO score 53% limitation    PT Start Time  1430    PT Stop Time  1520    PT Time Calculation (min)  50 min       Past Medical History:  Diagnosis Date  . COVID-19   . Diabetes mellitus without complication ([place])   . Hypertension     Past Surgical History:  Procedure Laterality Date  . BACK SURGERY      There were no vitals filed for this visit.  Subjective Assessment - 01/17/20 1435    Subjective  COVID-19 screen performed prior to patient entering clinic. Reports he has greater pain and numbness at night. Numbness may have improved "a little bit. overall"  MD put me OOW x 3 months, but I will have to call about PT.    Pertinent History  HTN, DM, 2 previous spinal surgeries.    How long can you walk comfortably?  Short community distances.    Diagnostic tests  MRI.    Patient Stated Goals  Be outdoors.  Hunt.    Pain Onset  More than a month ago                       [facility] Adult PT Treatment/Exercise - 01/17/20 0001      Exercises   Exercises  Knee/Hip      Lumbar Exercises: Aerobic   Nustep  Level 6 x 15 minutes.  LEs only      Lumbar Exercises: Standing   Row  Strengthening;Both;20 reps;Theraband    Row Limitations  bllue XTS      Lumbar Exercises: Supine   Ab Set  10 reps    Bent Knee Raise  20 reps;10 reps    Bridge  15 reps    Straight Leg Raise  10 reps;2 seconds      Knee/Hip Exercises: Machines for  Strengthening   Cybex Knee Extension  20# 3x10 reps    Cybex Knee Flexion  40# 3x10 reps      Knee/Hip Exercises: Standing   Rocker Board  --    SLS  RT and LT x 6 mins total             PT Education - 01/17/20 1456    Education Details  core and balance exs    Person(s) Educated  Patient    Methods  Explanation;Demonstration;Tactile cues;Verbal cues;Handout    Comprehension  Verbalized understanding;Returned demonstration          PT Long Term Goals - 01/17/20 1451      PT LONG TERM GOAL #1   Title  Independent with a HEP.    Time  6    Period  Weeks    Status  Achieved      PT LONG TERM GOAL #2   Title  Increase right ankle dorsiflexion to 6- 8 degrees to normalize the  patient's gait pattern.    Time  6    Period  Weeks    Status  Achieved      PT LONG TERM GOAL #3   Title  Right LE strength 4+/5.    Time  6    Period  Weeks    Status  Not Met   NM 4-/5     PT LONG TERM GOAL #4   Title  Walk 250 feet with normal dorsiflexion.    Time  6    Period  Weeks    Status  Achieved      PT LONG TERM GOAL #5   Title  Perform a reciprocating stair gait with one railing.    Time  6    Period  Weeks    Status  Partially Met   unable to perform every time           Plan - 01/17/20 1508    Clinical Impression Statement  Pt arrived today doing about the same with BLE radicular symptoms with some decreased  numbness.  Pt was guided through balance and strengthening exs/act's for LE's as well as HEP for core strengthening. Normal modality response today. FOTO 53% limitation and DC    Personal Factors and Comorbidities  Comorbidity 1;Comorbidity 2    Comorbidities  HTN, DM, 2 previous spinal surgeries.    Stability/Clinical Decision Making  Evolving/Moderate complexity    Rehab Potential  Good    PT Frequency  2x / week    PT Duration  6 weeks    PT Treatment/Interventions  ADLs/Self Care Home Management;Cryotherapy;Electrical Stimulation;Gait  training;Ultrasound;Moist Heat;Stair training;Functional mobility training;Therapeutic activities;Therapeutic exercise;Neuromuscular re-education;Patient/family education;Passive range of motion;Manual techniques    PT Next Visit Plan  On Hold until Pt calls MD    Consulted and Agree with Plan of Care  Patient       Patient will benefit from skilled therapeutic intervention in order to improve the following deficits and impairments:  Pain, Decreased activity tolerance, Abnormal gait, Decreased strength  Visit Diagnosis: Muscle weakness (generalized)     Problem List Patient Active Problem List   Diagnosis Date Noted  . DJD (degenerative joint disease) 09/24/2019  . Dizziness 09/23/2019  . HTN (hypertension) 09/23/2019  . HLD (hyperlipidemia) 09/23/2019  . CAD (coronary artery disease) 09/23/2019  . DM2 (diabetes mellitus, type 2) ([place]) 09/23/2019    Caesar Mannella,[name] , PTA 01/17/2020, 3:37 PM  [facility] [address] [place], [place], [address] Phone: [phone]   Fax:  [phone]  Name: Tim Torres MRN: [id number] Date of Birth: [birth year]

## 2020-05-05 ENCOUNTER — Other Ambulatory Visit: Payer: Self-pay | Admitting: Neurosurgery

## 2020-05-05 DIAGNOSIS — M5416 Radiculopathy, lumbar region: Secondary | ICD-10-CM

## 2020-05-06 ENCOUNTER — Telehealth: Payer: Self-pay | Admitting: Nurse Practitioner

## 2020-05-06 NOTE — Telephone Encounter (Signed)
Phone call to patient to verify medication list and allergies for myelogram procedure. Pt aware he will not need to hold any medications for this procedure. Pre and post procedure instructions reviewed with pt. Pt verbalized understanding.  

## 2020-05-20 ENCOUNTER — Ambulatory Visit
Admission: RE | Admit: 2020-05-20 | Discharge: 2020-05-20 | Disposition: A | Discharge status: Home / Self Care | Payer: Medicare HMO | Source: Ambulatory Visit | Attending: Neurosurgery | Admitting: Neurosurgery

## 2020-05-20 ENCOUNTER — Other Ambulatory Visit: Payer: Self-pay

## 2020-05-20 DIAGNOSIS — M5416 Radiculopathy, lumbar region: Secondary | ICD-10-CM

## 2020-05-20 MED ORDER — DIAZEPAM 5 MG PO TABS
5.0000 mg | ORAL_TABLET | Freq: Once | ORAL | Status: AC
  Administered 2020-05-20: 5 mg via ORAL

## 2020-05-20 MED ORDER — IOPAMIDOL (ISOVUE-M 200) INJECTION 41%
18.0000 mL | Freq: Once | INTRAMUSCULAR | Status: AC
  Administered 2020-05-20: 18 mL via INTRATHECAL

## 2020-05-20 NOTE — Discharge Instructions (Signed)

## 2020-06-05 ENCOUNTER — Other Ambulatory Visit: Payer: Self-pay | Admitting: Neurosurgery

## 2020-06-23 NOTE — Progress Notes (Signed)
Your procedure is scheduled on Friday August 13.  Report to [facility] Main Entrance "A" at 06:00 A.M., and check in at the Admitting office.  Call this number if you have problems the morning of surgery: [phone]  Call [phone] if you have any questions prior to your surgery date Monday-Friday 8am-4pm   Remember: Do not eat or drink after midnight the night before your surgery  Take these medicines the morning of surgery with A SIP OF WATER allopurinol (ZYLOPRIM)  lovastatin (MEVACOR) metoprolol tartrate (LOPRESSOR)  If needed: meclizine (ANTIVERT)   Follow your surgeon's instructions on when to stop Aspirin.  If no instructions were given by your surgeon then you will need to call the office to get those instructions.    As of today, STOP taking any Aspirin (unless otherwise instructed by your surgeon), Aleve, Naproxen, Ibuprofen, Motrin, Advil, Goody's, BC's, all herbal medications, fish oil, and all vitamins.   WHAT DO I DO ABOUT MY DIABETES MEDICATION? Marland Kitchen Do not take any ORAL diabetic medication morning or surgery - includes: metFORMIN (GLUCOPHAGE)   HOW TO MANAGE YOUR DIABETES BEFORE AND AFTER SURGERY  Why is it important to control my blood sugar before and after surgery? . Improving blood sugar levels before and after surgery helps healing and can limit problems. . A way of improving blood sugar control is eating a healthy diet by: o  Eating less sugar and carbohydrates o  Increasing activity/exercise o  Talking with your doctor about reaching your blood sugar goals . High blood sugars (greater than 180 mg/dL) can raise your risk of infections and slow your recovery, so you will need to focus on controlling your diabetes during the weeks before surgery. . Make sure that the doctor who takes care of your diabetes knows about your planned surgery including the date and location.  How do I manage my blood sugar before surgery? . Check your blood sugar at least 4  times a day, starting 2 days before surgery, to make sure that the level is not too high or low. . Check your blood sugar the morning of your surgery when you wake up and every 2 hours until you get to the Short Stay unit. o If your blood sugar is less than 70 mg/dL, you will need to treat for low blood sugar: - Do not take insulin. - Treat a low blood sugar (less than 70 mg/dL) with  cup of clear juice (cranberry or apple), 4 glucose tablets, OR glucose gel. - Recheck blood sugar in 15 minutes after treatment (to make sure it is greater than 70 mg/dL). If your blood sugar is not greater than 70 mg/dL on recheck, call [phone] for further instructions. . Report your blood sugar to the short stay nurse when you get to Short Stay.  . If you are admitted to the hospital after surgery: o Your blood sugar will be checked by the staff and you will probably be given insulin after surgery (instead of oral diabetes medicines) to make sure you have good blood sugar levels. o The goal for blood sugar control after surgery is 80-180 mg/dL.    The Morning of Surgery  Do not wear jewelry.  Do not wear lotions, powders, colognes, or deodorant Men may shave face and neck.  Do not bring valuables to the hospital.  [facility] is not responsible for any belongings or valuables.  If you are a smoker, DO NOT Smoke 24 hours prior to surgery  If you  wear a CPAP at night please bring your mask the morning of surgery   Remember that you must have someone to transport you home after your surgery, and remain with you for 24 hours if you are discharged the same day.   Please bring cases for contacts, glasses, hearing aids, dentures or bridgework because it cannot be worn into surgery.    Leave your suitcase in the car.  After surgery it may be brought to your room.  For patients admitted to the hospital, discharge time will be determined by your treatment team.  Patients discharged the day of surgery  will not be allowed to drive home.    Special instructions:   [place]- Preparing For Surgery  Before surgery, you can play an important role. Because skin is not sterile, your skin needs to be as free of germs as possible. You can reduce the number of germs on your skin by washing with CHG (chlorahexidine gluconate) Soap before surgery.  CHG is an antiseptic cleaner which kills germs and bonds with the skin to continue killing germs even after washing.    Oral Hygiene is also important to reduce your risk of infection.  Remember - BRUSH YOUR TEETH THE MORNING OF SURGERY WITH YOUR REGULAR TOOTHPASTE  Please do not use if you have an allergy to CHG or antibacterial soaps. If your skin becomes reddened/irritated stop using the CHG.  Do not shave (including legs and underarms) for at least 48 hours prior to first CHG shower. It is OK to shave your face.  Please follow these instructions carefully.   1. Shower the NIGHT BEFORE SURGERY and the MORNING OF SURGERY with CHG Soap.   2. If you chose to wash your hair and body, wash as usual with your normal shampoo and body-wash/soap.  3. Rinse your hair and body thoroughly to remove the shampoo and soap.  4. Apply CHG directly to the skin (ONLY FROM THE NECK DOWN) and wash gently with a scrungie or a clean washcloth.   5. Do not use on open wounds or open sores. Avoid contact with your eyes, ears, mouth and genitals (private parts). Wash Face and genitals (private parts)  with your normal soap.   6. Wash thoroughly, paying special attention to the area where your surgery will be performed.  7. Thoroughly rinse your body with warm water from the neck down.  8. DO NOT shower/wash with your normal soap after using and rinsing off the CHG Soap.  9. Pat yourself dry with a CLEAN TOWEL.  10. Wear CLEAN PAJAMAS to bed the night before surgery  11. Place CLEAN SHEETS on your bed the night of your first shower and DO NOT SLEEP WITH  PETS.  12. Wear comfortable clothes the morning of surgery.     Day of Surgery:  Please shower the morning of surgery with the CHG soap Do not apply any deodorants/lotions. Please wear clean clothes to the hospital/surgery center.   Remember to brush your teeth WITH YOUR REGULAR TOOTHPASTE.   Please read over the following fact sheets that you were given.

## 2020-06-24 ENCOUNTER — Other Ambulatory Visit: Payer: Self-pay

## 2020-06-24 ENCOUNTER — Encounter (HOSPITAL_COMMUNITY): Payer: Self-pay

## 2020-06-24 ENCOUNTER — Encounter (HOSPITAL_COMMUNITY)
Admission: RE | Admit: 2020-06-24 | Discharge: 2020-06-24 | Disposition: A | Discharge status: Home / Self Care | Payer: Medicare HMO | Source: Ambulatory Visit | Attending: Neurosurgery | Admitting: Neurosurgery

## 2020-06-24 ENCOUNTER — Other Ambulatory Visit (HOSPITAL_COMMUNITY)
Admission: RE | Admit: 2020-06-24 | Discharge: 2020-06-24 | Disposition: A | Discharge status: Home / Self Care | Payer: Medicare HMO | Source: Ambulatory Visit | Attending: Neurosurgery | Admitting: Neurosurgery

## 2020-06-24 DIAGNOSIS — Z01812 Encounter for preprocedural laboratory examination: Secondary | ICD-10-CM | POA: Insufficient documentation

## 2020-06-24 DIAGNOSIS — Z20822 Contact with and (suspected) exposure to covid-19: Secondary | ICD-10-CM | POA: Insufficient documentation

## 2020-06-24 HISTORY — DX: Sleep apnea, unspecified: G47.30

## 2020-06-24 LAB — CBC WITH DIFFERENTIAL/PLATELET
Abs Immature Granulocytes: 0.01 10*3/uL (ref 0.00–0.07)
Basophils Absolute: 0.1 10*3/uL (ref 0.0–0.1)
Basophils Relative: 1 %
Eosinophils Absolute: 0.5 10*3/uL (ref 0.0–0.5)
Eosinophils Relative: 7 %
HCT: 41.8 % (ref 39.0–52.0)
Hemoglobin: 13.6 g/dL (ref 13.0–17.0)
Immature Granulocytes: 0 %
Lymphocytes Relative: 26 %
Lymphs Abs: 1.6 10*3/uL (ref 0.7–4.0)
MCH: 31.4 pg (ref 26.0–34.0)
MCHC: 32.5 g/dL (ref 30.0–36.0)
MCV: 96.5 fL (ref 80.0–100.0)
Monocytes Absolute: 0.4 10*3/uL (ref 0.1–1.0)
Monocytes Relative: 6 %
Neutro Abs: 3.7 10*3/uL (ref 1.7–7.7)
Neutrophils Relative %: 60 %
Platelets: 152 10*3/uL (ref 150–400)
RBC: 4.33 MIL/uL (ref 4.22–5.81)
RDW: 13.5 % (ref 11.5–15.5)
WBC: 6.2 10*3/uL (ref 4.0–10.5)
nRBC: 0 % (ref 0.0–0.2)

## 2020-06-24 LAB — SURGICAL PCR SCREEN
MRSA, PCR: POSITIVE — AB
Staphylococcus aureus: POSITIVE — AB

## 2020-06-24 LAB — BASIC METABOLIC PANEL
Anion gap: 9 (ref 5–15)
BUN: 18 mg/dL (ref 8–23)
CO2: 23 mmol/L (ref 22–32)
Calcium: 9.6 mg/dL (ref 8.9–10.3)
Chloride: 107 mmol/L (ref 98–111)
Creatinine, Ser: 1.19 mg/dL (ref 0.61–1.24)
GFR calc Af Amer: >60 mL/min (ref >60)
GFR calc non Af Amer: >60 mL/min (ref >60)
Glucose, Bld: 147 mg/dL — ABNORMAL HIGH (ref 70–99)
Potassium: 4.6 mmol/L (ref 3.5–5.1)
Sodium: 139 mmol/L (ref 135–145)

## 2020-06-24 LAB — TYPE AND SCREEN
ABO/RH(D): A POS
Antibody Screen: NEGATIVE

## 2020-06-24 LAB — SARS CORONAVIRUS 2 (TAT 6-24 HRS): SARS Coronavirus 2: NEGATIVE

## 2020-06-24 LAB — GLUCOSE, CAPILLARY: Glucose-Capillary: 143 mg/dL — ABNORMAL HIGH (ref 70–99)

## 2020-06-24 NOTE — Progress Notes (Addendum)
PCP - Dr. Dewain Penning Cardiologist - Dr. Cathlean Cower  PPM/ICD - denies  Chest x-ray - N/A EKG - 06/11/2020 - (C.E. tracing requested) Stress Test - per patient back in 2006 ECHO - patient unsure Cardiac Cath - per patient, back in 1997  Sleep Study - Yes, patient stated was told he has OSA, given a CPAP device and turned it back in, unable to tolerate device per patient  Fasting Blood Sugar -  115 - 130 Checks Blood Sugar 2 - 3 times/week A1c: 6.0 on 05/01/2020 (C.E)  Blood Thinner Instructions: N/A Aspirin Instructions: Per patient last dose 05/20/2020  ERAS Protcol - No  COVID TEST- Scheduled for today 06/24/2020. Patient verbalized understanding of self-quarantine instructions, appointment time and place.  Anesthesia review: YES, cardiac hx, cardiac clearance, cardiac records requested  Patient denies shortness of breath, fever, cough and chest pain at PAT appointment  All instructions explained to the patient, with a verbal understanding of the material. Patient agrees to go over the instructions while at home for a better understanding. Patient also instructed to self quarantine after being tested for COVID-19. The opportunity to ask questions was provided.

## 2020-06-24 NOTE — Progress Notes (Signed)
Tim Torres called to see if he could continue to use Rogaine, I instructed patient to not use the morning of surgery.

## 2020-06-25 ENCOUNTER — Encounter (HOSPITAL_COMMUNITY): Payer: Self-pay

## 2020-06-25 NOTE — Anesthesia Preprocedure Evaluation (Addendum)
Anesthesia Evaluation  Patient identified by MRN, date of birth, ID band  Airway Mallampati: II  TM Distance: >3 FB Neck ROM: Full    Dental no notable dental hx.    Pulmonary sleep apnea , former smoker,  H/o Covid in 10/2019   Pulmonary exam normal        Cardiovascular hypertension, + CAD (remote; on ASA, holding ASA 7 days per cards) and + Past MI  Normal cardiovascular exam Rhythm:Regular Rate:Normal     Neuro/Psych negative neurological ROS  negative psych ROS   GI/Hepatic negative GI ROS, Neg liver ROS,   Endo/Other  diabetes, Type 2, Oral Hypoglycemic Agents  Renal/GU      Musculoskeletal  (+) Arthritis ,   Abdominal   Peds  Hematology   Anesthesia Other Findings   Reproductive/Obstetrics                            Anesthesia Physical Anesthesia Plan  ASA: III  Anesthesia Plan: General   Post-op Pain Management:    Induction: Intravenous  PONV Risk Score and Plan: 2 and Ondansetron, Dexamethasone and Midazolam  Airway Management Planned: Oral ETT  Additional Equipment:   Intra-op Plan:   Post-operative Plan:   Informed Consent:   Plan Discussed with:   Anesthesia Plan Comments: (GETA. 2 PIV.  )      Anesthesia Quick Evaluation

## 2020-06-25 NOTE — Progress Notes (Signed)
Anesthesia Chart Review: Tim Torres   Case: [id number] Date/Time: 06/27/20 0745   Procedure: PLIF - L4-L5 (N/A Back)   Anesthesia type: General   Pre-op diagnosis: Spondylolisthesis   Location: [facility] OR ROOM 18 / [place] OR   Surgeons: Earnie Larsson, MD      DISCUSSION: Patient is a 69 year old male scheduled for the above procedure.   History includes former smoker (quit 10/15/82), HTN, DM2, ED, CAD (MI 11/16/1996, s/p PTCA distal CX with trivial posterior LV wall hypokinesis), OSA (intolerant to CPAP), COVID-19 + 10/21/19), back surgery (s/p right thoracotomy for right transthoracic T5-6/6-7 microdiscectomy 09/09/08; right T4-5 transpedicular microdiscectomy 09/05/09, microdiscectomy 07/2019).  BMI is consistent with obesity.  Last cardiology evaluation on 06/11/20 by Estevan Ryder, PA-C. She wrote, "He has the ability to perform > 4 MET levels of activity (greater than 4 METS levels of activity needed for surgical clearance) and may proceed with surgery with no additional cardiac testing or procedures." Permission to hold ASA for 7 days prior to surgery given. Will consider updating TEE at next years visit.  06/24/2020 presurgical COVID-19 test negative.  Anesthesia team to evaluate on the day of surgery.   VS: BP (!) 142/80   Pulse (!) 58   Temp 36.8 C (Oral)   Resp 17   Ht 5' 11.5" (1.816 m)   Wt 111.7 kg   SpO2 100%   BMI 33.87 kg/m    PROVIDERS: Haimes, Youlanda Roys, MD is PCP ([place]) Clarene Critchley, MD is cardiologist ([place]) Rosario Adie, MD is urologist ([place])   LABS: Labs reviewed: Acceptable for surgery. A1c 6.0% on 05/01/20 [facility]) (all labs ordered are listed, but only abnormal results are displayed)  Labs Reviewed  SURGICAL PCR SCREEN - Abnormal; Notable for the following components:      Result Value   MRSA, PCR POSITIVE (*)    Staphylococcus aureus POSITIVE (*)    All other components within normal limits   GLUCOSE, CAPILLARY - Abnormal; Notable for the following components:   Glucose-Capillary 143 (*)    All other components within normal limits  BASIC METABOLIC PANEL - Abnormal; Notable for the following components:   Glucose, Bld 147 (*)    All other components within normal limits  CBC WITH DIFFERENTIAL/PLATELET  TYPE AND SCREEN     IMAGES: CT L-spine with myelogram 05/20/20: IMPRESSION: 1. Postsurgical changes at L4-L5 with residual mild spinal canal and lateral recess stenosis. Unchanged moderate left neuroforaminal stenosis. 2. Unchanged moderate right neuroforaminal stenosis at L5-S1. 3. Unchanged mild to moderate spinal canal stenosis at L2-L3.  MRI/MRA Brain/Head 09/24/19: IMPRESSION MRI: 1. No acute intracranial abnormality. Normal for age noncontrast MRI appearance of the brain. 2. Mild to moderate ethmoid and sphenoid sinus inflammation. IMPRESSION MRA: Normal intracranial MRA allowing for mild motion artifact.   EKG: 06/11/20 (Dr. Marijo File): SB at 58 bpm. LAD. Non-specific ST/T wave abnormality.    CV: Patient reported last cardiac cath was at the time of his MI (1998) and that last stress test was in 2006. He was unsure of last echo. According to 06/11/20 cardiology note ([place]), "History of MI with LHC on 11/16/1996 with PTCA of the distal circumflex with trivial posterior LV wall hypokinesis."   Past Medical History:  Diagnosis Date  . COVID-19   . Diabetes mellitus without complication ([place])   . Hypertension   . Myocardial infarction ([place]) 1997   per patient  . Sleep apnea  per patient "does not wear CPAP, unable to tolerate"    Past Surgical History:  Procedure Laterality Date  . ANGIOPLASTY     per patient "back in 1997"  . BACK SURGERY      MEDICATIONS: . allopurinol (ZYLOPRIM) 300 MG tablet  . aspirin 325 MG tablet  . lovastatin (MEVACOR) 40 MG tablet  . meclizine (ANTIVERT) 25 MG tablet  . metFORMIN (GLUCOPHAGE) 500 MG tablet   . metoprolol tartrate (LOPRESSOR) 50 MG tablet  . minoxidil (ROGAINE) 2 % external solution  . ondansetron (ZOFRAN) 4 MG tablet  . zolpidem (AMBIEN) 5 MG tablet   No current facility-administered medications for this encounter.  Last ASA 05/20/20.   Myra Gianotti, PA-C Surgical Short Stay/Anesthesiology [facility] Phone [phone] [facility] Phone [phone] 06/25/2020 11:35 AM

## 2020-06-26 ENCOUNTER — Encounter (HOSPITAL_COMMUNITY): Payer: Self-pay | Admitting: Neurosurgery

## 2020-06-26 MED ORDER — VANCOMYCIN HCL 1500 MG/300ML IV SOLN
1500.0000 mg | INTRAVENOUS | Status: DC
  Filled 2020-06-26: qty 300

## 2020-06-27 ENCOUNTER — Encounter (HOSPITAL_COMMUNITY): Payer: Self-pay | Admitting: Neurosurgery

## 2020-06-27 ENCOUNTER — Ambulatory Visit (HOSPITAL_COMMUNITY): Payer: Medicare HMO

## 2020-06-27 ENCOUNTER — Encounter (HOSPITAL_COMMUNITY)
Admission: RE | Disposition: A | Discharge status: Home / Self Care | Payer: Self-pay | Source: Home / Self Care | Attending: Neurosurgery

## 2020-06-27 ENCOUNTER — Ambulatory Visit (HOSPITAL_COMMUNITY): Payer: Medicare HMO | Admitting: Anesthesiology

## 2020-06-27 ENCOUNTER — Observation Stay (HOSPITAL_COMMUNITY)
Admission: RE | Admit: 2020-06-27 | Discharge: 2020-06-28 | Disposition: A | Discharge status: Home / Self Care | Payer: Medicare HMO | Attending: Neurosurgery | Admitting: Neurosurgery

## 2020-06-27 ENCOUNTER — Other Ambulatory Visit: Payer: Self-pay

## 2020-06-27 ENCOUNTER — Ambulatory Visit (HOSPITAL_COMMUNITY): Payer: Medicare HMO | Admitting: Vascular Surgery

## 2020-06-27 DIAGNOSIS — I2510 Atherosclerotic heart disease of native coronary artery without angina pectoris: Secondary | ICD-10-CM | POA: Insufficient documentation

## 2020-06-27 DIAGNOSIS — M4310 Spondylolisthesis, site unspecified: Secondary | ICD-10-CM | POA: Diagnosis present

## 2020-06-27 DIAGNOSIS — Z955 Presence of coronary angioplasty implant and graft: Secondary | ICD-10-CM | POA: Insufficient documentation

## 2020-06-27 DIAGNOSIS — I252 Old myocardial infarction: Secondary | ICD-10-CM | POA: Diagnosis not present

## 2020-06-27 DIAGNOSIS — Z7984 Long term (current) use of oral hypoglycemic drugs: Secondary | ICD-10-CM | POA: Diagnosis not present

## 2020-06-27 DIAGNOSIS — Z87891 Personal history of nicotine dependence: Secondary | ICD-10-CM | POA: Insufficient documentation

## 2020-06-27 DIAGNOSIS — I10 Essential (primary) hypertension: Secondary | ICD-10-CM | POA: Diagnosis not present

## 2020-06-27 DIAGNOSIS — E119 Type 2 diabetes mellitus without complications: Secondary | ICD-10-CM | POA: Insufficient documentation

## 2020-06-27 DIAGNOSIS — M48062 Spinal stenosis, lumbar region with neurogenic claudication: Principal | ICD-10-CM | POA: Insufficient documentation

## 2020-06-27 DIAGNOSIS — Z79899 Other long term (current) drug therapy: Secondary | ICD-10-CM | POA: Diagnosis not present

## 2020-06-27 DIAGNOSIS — M4316 Spondylolisthesis, lumbar region: Secondary | ICD-10-CM | POA: Insufficient documentation

## 2020-06-27 DIAGNOSIS — Z419 Encounter for procedure for purposes other than remedying health state, unspecified: Secondary | ICD-10-CM

## 2020-06-27 LAB — GLUCOSE, CAPILLARY
Glucose-Capillary: 139 mg/dL — ABNORMAL HIGH (ref 70–99)
Glucose-Capillary: 179 mg/dL — ABNORMAL HIGH (ref 70–99)
Glucose-Capillary: 288 mg/dL — ABNORMAL HIGH (ref 70–99)
Glucose-Capillary: 158 mg/dL — ABNORMAL HIGH (ref 70–99)

## 2020-06-27 SURGERY — POSTERIOR LUMBAR FUSION 1 LEVEL
Anesthesia: General | Site: Back

## 2020-06-27 MED ORDER — PRAVASTATIN SODIUM 10 MG PO TABS
20.0000 mg | ORAL_TABLET | Freq: Two times a day (BID) | ORAL | Status: DC
  Administered 2020-06-27 – 2020-06-28 (×2): 20 mg via ORAL
  Filled 2020-06-27 (×2): qty 2

## 2020-06-27 MED ORDER — SODIUM CHLORIDE 0.9% FLUSH
3.0000 mL | Freq: Two times a day (BID) | INTRAVENOUS | Status: DC
  Administered 2020-06-27 (×2): 3 mL via INTRAVENOUS

## 2020-06-27 MED ORDER — HYDROCODONE-ACETAMINOPHEN 10-325 MG PO TABS
1.0000 | ORAL_TABLET | ORAL | Status: DC | PRN
  Administered 2020-06-28 (×2): 1 via ORAL
  Filled 2020-06-27 (×2): qty 1

## 2020-06-27 MED ORDER — DEXAMETHASONE SODIUM PHOSPHATE 10 MG/ML IJ SOLN
10.0000 mg | Freq: Once | INTRAMUSCULAR | Status: DC
  Filled 2020-06-27: qty 1

## 2020-06-27 MED ORDER — METOPROLOL TARTRATE 50 MG PO TABS
50.0000 mg | ORAL_TABLET | Freq: Two times a day (BID) | ORAL | Status: DC
  Administered 2020-06-27 – 2020-06-28 (×2): 50 mg via ORAL
  Filled 2020-06-27 (×2): qty 1
  Filled 2020-06-27 (×2): qty 2

## 2020-06-27 MED ORDER — BUPIVACAINE HCL (PF) 0.25 % IJ SOLN
INTRAMUSCULAR | Status: AC
  Filled 2020-06-27: qty 30

## 2020-06-27 MED ORDER — VANCOMYCIN HCL 1000 MG IV SOLR
INTRAVENOUS | Status: AC
  Filled 2020-06-27: qty 1000

## 2020-06-27 MED ORDER — THROMBIN 20000 UNITS EX SOLR
CUTANEOUS | Status: AC
  Filled 2020-06-27: qty 20000

## 2020-06-27 MED ORDER — MIDAZOLAM HCL 5 MG/5ML IJ SOLN
INTRAMUSCULAR | Status: DC | PRN
  Administered 2020-06-27: 2 mg via INTRAVENOUS

## 2020-06-27 MED ORDER — VANCOMYCIN HCL 1000 MG IV SOLR
INTRAVENOUS | Status: DC | PRN
  Administered 2020-06-27: 1000 mg via TOPICAL

## 2020-06-27 MED ORDER — PHENOL 1.4 % MT LIQD: 1.0000 | OROMUCOSAL | Status: DC | PRN

## 2020-06-27 MED ORDER — MIDAZOLAM HCL 2 MG/2ML IJ SOLN
INTRAMUSCULAR | Status: AC
  Filled 2020-06-27: qty 2

## 2020-06-27 MED ORDER — FENTANYL CITRATE (PF) 250 MCG/5ML IJ SOLN
INTRAMUSCULAR | Status: DC | PRN
  Administered 2020-06-27 (×3): 25 ug via INTRAVENOUS
  Administered 2020-06-27: 75 ug via INTRAVENOUS

## 2020-06-27 MED ORDER — PHENYLEPHRINE 40 MCG/ML (10ML) SYRINGE FOR IV PUSH (FOR BLOOD PRESSURE SUPPORT)
PREFILLED_SYRINGE | INTRAVENOUS | Status: AC
  Filled 2020-06-27: qty 10

## 2020-06-27 MED ORDER — MECLIZINE HCL 25 MG PO TABS
25.0000 mg | ORAL_TABLET | Freq: Three times a day (TID) | ORAL | Status: DC | PRN
  Filled 2020-06-27: qty 1

## 2020-06-27 MED ORDER — PROPOFOL 10 MG/ML IV BOLUS
INTRAVENOUS | Status: AC
  Filled 2020-06-27: qty 20

## 2020-06-27 MED ORDER — PROMETHAZINE HCL 25 MG/ML IJ SOLN: 6.2500 mg | INTRAMUSCULAR | Status: DC | PRN

## 2020-06-27 MED ORDER — SUCCINYLCHOLINE CHLORIDE 200 MG/10ML IV SOSY
PREFILLED_SYRINGE | INTRAVENOUS | Status: AC
  Filled 2020-06-27: qty 10

## 2020-06-27 MED ORDER — ONDANSETRON HCL 4 MG/2ML IJ SOLN
INTRAMUSCULAR | Status: DC | PRN
  Administered 2020-06-27: 4 mg via INTRAVENOUS

## 2020-06-27 MED ORDER — 0.9 % SODIUM CHLORIDE (POUR BTL) OPTIME
TOPICAL | Status: DC | PRN
  Administered 2020-06-27: 1000 mL

## 2020-06-27 MED ORDER — HYDROMORPHONE HCL 1 MG/ML IJ SOLN
0.2500 mg | INTRAMUSCULAR | Status: DC | PRN
  Administered 2020-06-27 (×2): 0.5 mg via INTRAVENOUS

## 2020-06-27 MED ORDER — POLYETHYLENE GLYCOL 3350 17 G PO PACK: 17.0000 g | PACK | Freq: Every day | ORAL | Status: DC | PRN

## 2020-06-27 MED ORDER — LIDOCAINE 2% (20 MG/ML) 5 ML SYRINGE
INTRAMUSCULAR | Status: AC
  Filled 2020-06-27: qty 5

## 2020-06-27 MED ORDER — FLEET ENEMA 7-19 GM/118ML RE ENEM: 1.0000 | ENEMA | Freq: Once | RECTAL | Status: DC | PRN

## 2020-06-27 MED ORDER — BUPIVACAINE HCL (PF) 0.25 % IJ SOLN
INTRAMUSCULAR | Status: DC | PRN
  Administered 2020-06-27: 20 mL

## 2020-06-27 MED ORDER — DEXAMETHASONE SODIUM PHOSPHATE 10 MG/ML IJ SOLN
INTRAMUSCULAR | Status: DC | PRN
  Administered 2020-06-27: 5 mg via INTRAVENOUS

## 2020-06-27 MED ORDER — PROPOFOL 10 MG/ML IV BOLUS
INTRAVENOUS | Status: DC | PRN
  Administered 2020-06-27: 150 mg via INTRAVENOUS

## 2020-06-27 MED ORDER — CHLORHEXIDINE GLUCONATE CLOTH 2 % EX PADS: 6.0000 | MEDICATED_PAD | Freq: Once | CUTANEOUS | Status: DC

## 2020-06-27 MED ORDER — SUGAMMADEX SODIUM 200 MG/2ML IV SOLN
INTRAVENOUS | Status: DC | PRN
  Administered 2020-06-27: 200 mg via INTRAVENOUS

## 2020-06-27 MED ORDER — ORAL CARE MOUTH RINSE: 15.0000 mL | Freq: Once | OROMUCOSAL | Status: AC

## 2020-06-27 MED ORDER — HYDROMORPHONE HCL 1 MG/ML IJ SOLN: 1.0000 mg | INTRAMUSCULAR | Status: DC | PRN

## 2020-06-27 MED ORDER — SODIUM CHLORIDE 0.9 % IV SOLN
250.0000 mL | INTRAVENOUS | Status: DC
  Administered 2020-06-27: 250 mL via INTRAVENOUS

## 2020-06-27 MED ORDER — EPHEDRINE 5 MG/ML INJ
INTRAVENOUS | Status: AC
  Filled 2020-06-27: qty 10

## 2020-06-27 MED ORDER — SODIUM CHLORIDE 0.9 % IV SOLN
INTRAVENOUS | Status: DC | PRN
  Administered 2020-06-27: 500 mL

## 2020-06-27 MED ORDER — MENTHOL 3 MG MT LOZG
1.0000 | LOZENGE | OROMUCOSAL | Status: DC | PRN
  Filled 2020-06-27: qty 9

## 2020-06-27 MED ORDER — ROCURONIUM BROMIDE 10 MG/ML (PF) SYRINGE
PREFILLED_SYRINGE | INTRAVENOUS | Status: DC | PRN
  Administered 2020-06-27: 30 mg via INTRAVENOUS
  Administered 2020-06-27: 60 mg via INTRAVENOUS

## 2020-06-27 MED ORDER — ASPIRIN 325 MG PO TABS
325.0000 mg | ORAL_TABLET | Freq: Every day | ORAL | Status: DC
  Filled 2020-06-27: qty 1

## 2020-06-27 MED ORDER — METFORMIN HCL 500 MG PO TABS
500.0000 mg | ORAL_TABLET | Freq: Every day | ORAL | Status: DC
  Administered 2020-06-28: 500 mg via ORAL
  Filled 2020-06-27: qty 1

## 2020-06-27 MED ORDER — CEFAZOLIN SODIUM-DEXTROSE 2-4 GM/100ML-% IV SOLN
2.0000 g | INTRAVENOUS | Status: AC
  Administered 2020-06-27: 2 g via INTRAVENOUS
  Filled 2020-06-27: qty 100

## 2020-06-27 MED ORDER — METFORMIN HCL 500 MG PO TABS
1000.0000 mg | ORAL_TABLET | Freq: Every day | ORAL | Status: DC
  Administered 2020-06-27: 1000 mg via ORAL
  Filled 2020-06-27: qty 2

## 2020-06-27 MED ORDER — BISACODYL 10 MG RE SUPP: 10.0000 mg | Freq: Every day | RECTAL | Status: DC | PRN

## 2020-06-27 MED ORDER — ALLOPURINOL 300 MG PO TABS
300.0000 mg | ORAL_TABLET | Freq: Every day | ORAL | Status: DC
  Filled 2020-06-27: qty 1

## 2020-06-27 MED ORDER — ACETAMINOPHEN 650 MG RE SUPP: 650.0000 mg | RECTAL | Status: DC | PRN

## 2020-06-27 MED ORDER — ROCURONIUM BROMIDE 10 MG/ML (PF) SYRINGE
PREFILLED_SYRINGE | INTRAVENOUS | Status: AC
  Filled 2020-06-27: qty 10

## 2020-06-27 MED ORDER — LIDOCAINE 2% (20 MG/ML) 5 ML SYRINGE
INTRAMUSCULAR | Status: DC | PRN
  Administered 2020-06-27: 100 mg via INTRAVENOUS

## 2020-06-27 MED ORDER — DIAZEPAM 5 MG PO TABS
5.0000 mg | ORAL_TABLET | Freq: Four times a day (QID) | ORAL | Status: DC | PRN
  Administered 2020-06-27 – 2020-06-28 (×3): 5 mg via ORAL
  Filled 2020-06-27 (×3): qty 1

## 2020-06-27 MED ORDER — THROMBIN 20000 UNITS EX SOLR
CUTANEOUS | Status: DC | PRN
  Administered 2020-06-27: 20 mL via TOPICAL

## 2020-06-27 MED ORDER — EPHEDRINE SULFATE-NACL 50-0.9 MG/10ML-% IV SOSY
PREFILLED_SYRINGE | INTRAVENOUS | Status: DC | PRN
  Administered 2020-06-27 (×2): 10 mg via INTRAVENOUS

## 2020-06-27 MED ORDER — ONDANSETRON HCL 4 MG/2ML IJ SOLN: 4.0000 mg | Freq: Four times a day (QID) | INTRAMUSCULAR | Status: DC | PRN

## 2020-06-27 MED ORDER — SODIUM CHLORIDE 0.9% FLUSH: 3.0000 mL | INTRAVENOUS | Status: DC | PRN

## 2020-06-27 MED ORDER — ONDANSETRON HCL 4 MG PO TABS: 4.0000 mg | ORAL_TABLET | Freq: Four times a day (QID) | ORAL | Status: DC | PRN

## 2020-06-27 MED ORDER — ACETAMINOPHEN 325 MG PO TABS: 650.0000 mg | ORAL_TABLET | ORAL | Status: DC | PRN

## 2020-06-27 MED ORDER — HYDROMORPHONE HCL 1 MG/ML IJ SOLN
INTRAMUSCULAR | Status: AC
  Filled 2020-06-27: qty 1

## 2020-06-27 MED ORDER — ACETAMINOPHEN 500 MG PO TABS
1000.0000 mg | ORAL_TABLET | Freq: Once | ORAL | Status: AC
  Administered 2020-06-27: 1000 mg via ORAL
  Filled 2020-06-27: qty 2

## 2020-06-27 MED ORDER — METFORMIN HCL 500 MG PO TABS
500.0000 mg | ORAL_TABLET | Freq: Two times a day (BID) | ORAL | Status: DC
  Administered 2020-06-27: 500 mg via ORAL
  Filled 2020-06-27: qty 1

## 2020-06-27 MED ORDER — OXYCODONE HCL 5 MG PO TABS
10.0000 mg | ORAL_TABLET | ORAL | Status: DC | PRN
  Administered 2020-06-27 – 2020-06-28 (×4): 10 mg via ORAL
  Filled 2020-06-27 (×5): qty 2

## 2020-06-27 MED ORDER — ZOLPIDEM TARTRATE 5 MG PO TABS
5.0000 mg | ORAL_TABLET | Freq: Every evening | ORAL | Status: DC | PRN
  Administered 2020-06-27: 5 mg via ORAL
  Filled 2020-06-27: qty 1

## 2020-06-27 MED ORDER — CEFAZOLIN SODIUM-DEXTROSE 1-4 GM/50ML-% IV SOLN
1.0000 g | Freq: Three times a day (TID) | INTRAVENOUS | Status: AC
  Administered 2020-06-27 (×2): 1 g via INTRAVENOUS
  Filled 2020-06-27 (×2): qty 50

## 2020-06-27 MED ORDER — FENTANYL CITRATE (PF) 250 MCG/5ML IJ SOLN
INTRAMUSCULAR | Status: AC
  Filled 2020-06-27: qty 5

## 2020-06-27 MED ORDER — CHLORHEXIDINE GLUCONATE 0.12 % MT SOLN
15.0000 mL | Freq: Once | OROMUCOSAL | Status: AC
  Administered 2020-06-27: 15 mL via OROMUCOSAL
  Filled 2020-06-27: qty 15

## 2020-06-27 MED ORDER — LACTATED RINGERS IV SOLN: INTRAVENOUS | Status: DC

## 2020-06-27 MED ORDER — PRAVASTATIN SODIUM 10 MG PO TABS: 20.0000 mg | ORAL_TABLET | Freq: Every day | ORAL | Status: DC

## 2020-06-27 MED ORDER — DEXAMETHASONE SODIUM PHOSPHATE 10 MG/ML IJ SOLN
INTRAMUSCULAR | Status: AC
  Filled 2020-06-27: qty 1

## 2020-06-27 SURGICAL SUPPLY — 65 items
ADH SKN CLS APL DERMABOND .7 (GAUZE/BANDAGES/DRESSINGS) ×1
APL SKNCLS STERI-STRIP NONHPOA (GAUZE/BANDAGES/DRESSINGS) ×1
BAG DECANTER FOR FLEXI CONT (MISCELLANEOUS) ×3 IMPLANT
BENZOIN TINCTURE PRP APPL 2/3 (GAUZE/BANDAGES/DRESSINGS) ×3 IMPLANT
BLADE CLIPPER SURG (BLADE) IMPLANT
BUR CUTTER 7.0 ROUND (BURR) IMPLANT
BUR MATCHSTICK NEURO 3.0 LAGG (BURR) ×3 IMPLANT
CANISTER SUCT 3000ML PPV (MISCELLANEOUS) ×3 IMPLANT
CAP LCK SPNE (Orthopedic Implant) ×4 IMPLANT
CAP LOCK SPINE RADIUS (Orthopedic Implant) IMPLANT
CAP LOCKING (Orthopedic Implant) ×12 IMPLANT
CARTRIDGE OIL MAESTRO DRILL (MISCELLANEOUS) ×1 IMPLANT
CLOSURE WOUND 1/2 X4 (GAUZE/BANDAGES/DRESSINGS) ×1
CNTNR URN SCR LID CUP LEK RST (MISCELLANEOUS) ×1 IMPLANT
CONT SPEC 4OZ STRL OR WHT (MISCELLANEOUS) ×3
COVER BACK TABLE 60X90IN (DRAPES) ×3 IMPLANT
COVER WAND RF STERILE (DRAPES) ×1 IMPLANT
DECANTER SPIKE VIAL GLASS SM (MISCELLANEOUS) ×3 IMPLANT
DERMABOND ADVANCED (GAUZE/BANDAGES/DRESSINGS) ×2
DERMABOND ADVANCED .7 DNX12 (GAUZE/BANDAGES/DRESSINGS) ×1 IMPLANT
DEVICE INTERBODY ELEVATE 23X9 (Cage) ×4 IMPLANT
DIFFUSER DRILL AIR PNEUMATIC (MISCELLANEOUS) ×3 IMPLANT
DRAPE C-ARM 42X72 X-RAY (DRAPES) ×6 IMPLANT
DRAPE HALF SHEET 40X57 (DRAPES) IMPLANT
DRAPE LAPAROTOMY 100X72X124 (DRAPES) ×3 IMPLANT
DRAPE SURG 17X23 STRL (DRAPES) ×12 IMPLANT
DRSG OPSITE POSTOP 4X6 (GAUZE/BANDAGES/DRESSINGS) ×3 IMPLANT
DURAPREP 26ML APPLICATOR (WOUND CARE) ×3 IMPLANT
ELECT REM PT RETURN 9FT ADLT (ELECTROSURGICAL) ×3
ELECTRODE REM PT RTRN 9FT ADLT (ELECTROSURGICAL) ×1 IMPLANT
EVACUATOR 1/8 PVC DRAIN (DRAIN) IMPLANT
GAUZE 4X4 16PLY RFD (DISPOSABLE) IMPLANT
GAUZE SPONGE 4X4 12PLY STRL (GAUZE/BANDAGES/DRESSINGS) IMPLANT
GLOVE BIO SURGEON STRL SZ 6.5 (GLOVE) ×2 IMPLANT
GLOVE BIO SURGEONS STRL SZ 6.5 (GLOVE) ×1
GLOVE BIOGEL PI IND STRL 6.5 (GLOVE) ×1 IMPLANT
GLOVE BIOGEL PI INDICATOR 6.5 (GLOVE) ×2
GLOVE ECLIPSE 9.0 STRL (GLOVE) ×6 IMPLANT
GLOVE EXAM NITRILE XL STR (GLOVE) IMPLANT
GOWN STRL REUS W/ TWL LRG LVL3 (GOWN DISPOSABLE) IMPLANT
GOWN STRL REUS W/ TWL XL LVL3 (GOWN DISPOSABLE) ×2 IMPLANT
GOWN STRL REUS W/TWL 2XL LVL3 (GOWN DISPOSABLE) IMPLANT
GOWN STRL REUS W/TWL LRG LVL3 (GOWN DISPOSABLE)
GOWN STRL REUS W/TWL XL LVL3 (GOWN DISPOSABLE) ×6
KIT BASIN OR (CUSTOM PROCEDURE TRAY) ×3 IMPLANT
KIT TURNOVER KIT B (KITS) ×3 IMPLANT
MILL MEDIUM DISP (BLADE) ×3 IMPLANT
NEEDLE HYPO 22GX1.5 SAFETY (NEEDLE) ×3 IMPLANT
NS IRRIG 1000ML POUR BTL (IV SOLUTION) ×3 IMPLANT
OIL CARTRIDGE MAESTRO DRILL (MISCELLANEOUS) ×3
PACK LAMINECTOMY NEURO (CUSTOM PROCEDURE TRAY) ×3 IMPLANT
ROD RADIUS 40MM (Neuro Prosthesis/Implant) ×6 IMPLANT
ROD SPNL 40X5.5XNS TI RDS (Neuro Prosthesis/Implant) IMPLANT
SCREW 5.75X45MM (Screw) ×8 IMPLANT
SPONGE SURGIFOAM ABS GEL 100 (HEMOSTASIS) ×3 IMPLANT
STRIP CLOSURE SKIN 1/2X4 (GAUZE/BANDAGES/DRESSINGS) ×3 IMPLANT
SUT VIC AB 0 CT1 18XCR BRD8 (SUTURE) ×2 IMPLANT
SUT VIC AB 0 CT1 8-18 (SUTURE) ×3
SUT VIC AB 2-0 CT1 18 (SUTURE) ×3 IMPLANT
SUT VIC AB 3-0 SH 8-18 (SUTURE) ×4 IMPLANT
SYR BULB IRRIG 60ML STRL (SYRINGE) ×2 IMPLANT
TOWEL GREEN STERILE (TOWEL DISPOSABLE) ×3 IMPLANT
TOWEL GREEN STERILE FF (TOWEL DISPOSABLE) ×3 IMPLANT
TRAY FOLEY MTR SLVR 16FR STAT (SET/KITS/TRAYS/PACK) ×3 IMPLANT
WATER STERILE IRR 1000ML POUR (IV SOLUTION) ×3 IMPLANT

## 2020-06-27 NOTE — Evaluation (Signed)
Physical Therapy Evaluation & Discharge Patient Details Name: Tim Torres MRN: [id number] DOB: 09/02/1951 Today's Date: 06/27/2020   History of Present Illness  Pt is a 69 yo male who is s/p PLIF of L4/5. Pt has a PMH of T2DM, HTN, MI and a hx of 4 lumbar/thoracic spinal surgeries  Clinical Impression  Pt was evaluated today for the above diagnosis and the impairments listed below. Pt required supervision to min guard for all mobility tasks assessed today. Pt was educated on walking program and spinal precautions. Pt lives alone but has assistance available. Recommend outpatient PT, once cleared by MD. Feel that pt does not require further acute therapy. If needs change, please reconsult. Will sign off on this patient.     Follow Up Recommendations Outpatient PT (once cleared by MD)    Equipment Recommendations  None recommended by PT    Recommendations for Other Services       Precautions / Restrictions Precautions Precautions: Back Precaution Booklet Issued: Yes (comment) Precaution Comments: educated on spinal precautions Required Braces or Orthoses: Spinal Brace Spinal Brace: Lumbar corset;Applied in sitting position Restrictions Weight Bearing Restrictions: No      Mobility  Bed Mobility Overal bed mobility: Needs Assistance Bed Mobility: Sidelying to Sit;Sit to Sidelying   Sidelying to sit: Supervision;HOB elevated     Sit to sidelying: Supervision;HOB elevated General bed mobility comments: pt sitting in sidelying when entered the room, pt only required supervision with bed mobility for safety  Transfers Overall transfer level: Needs assistance Equipment used: Rolling walker (2 wheeled) Transfers: Sit to/from Stand Sit to Stand: Supervision         General transfer comment: pt required supervision for safety with RW for sit<>stand transfer  Ambulation/Gait Ambulation/Gait assistance: Supervision;Min guard Gait Distance (Feet): 200 Feet Assistive  device: Rolling walker (2 wheeled) Gait Pattern/deviations: Step-through pattern;Decreased step length - right;Decreased step length - left;Decreased stride length;Decreased dorsiflexion - right;Trunk flexed Gait velocity: decreased   General Gait Details: pt was noted to have slow guarded gait and a decreased DF on RLE. pt required cues for RW sequencing and proximity to device. Pt required supervision to min guard A for safety with ambulation.   Stairs Stairs: Yes Stairs assistance: Min guard Stair Management: One rail Right;Alternating pattern;Forwards Number of Stairs: 4 General stair comments: pt required min guard for safety with stair navigation. Pt utilized alternating step pattern with railings  Wheelchair Mobility    Modified Rankin (Stroke Patients Only)       Balance Overall balance assessment: Needs assistance Sitting-balance support: No upper extremity supported;Feet supported Sitting balance-Leahy Scale: Fair Sitting balance - Comments: pt able to sit EOB without UE support   Standing balance support: During functional activity;Bilateral upper extremity supported;No upper extremity supported Standing balance-Leahy Scale: Fair Standing balance comment: pt able to maintain standing balance while gait belt was readjusted without UE support                     Pertinent Vitals/Pain Pain Assessment: 0-10 Pain Score: 5  Pain Location: back Pain Descriptors / Indicators: Sore;Operative site guarding;Guarding Pain Intervention(s): Limited activity within patient's tolerance;Monitored during session;Repositioned;RN gave pain meds during session    Home Living Family/patient expects to be discharged to:: Private residence Living Arrangements: Alone Available Help at Discharge: Family;Friend(s) Type of Home: House Home Access: Stairs to enter Entrance Stairs-Rails: Right Entrance Stairs-Number of Steps: 2 Home Layout: One level Home Equipment: Walker - 2  wheels;Walker - 4 wheels;Cane -  single point;Shower seat;Grab bars - tub/shower Additional Comments: pt lives alone but will have his girlfriend and son avaliable for assistance 24/7    Prior Function Level of Independence: Independent               Hand Dominance        Extremity/Trunk Assessment   Upper Extremity Assessment Upper Extremity Assessment: Overall WFL for tasks assessed    Lower Extremity Assessment Lower Extremity Assessment: Generalized weakness;RLE deficits/detail RLE Deficits / Details: Noted mild RLE dropfoot. Pt reports it has improved since surgery    Cervical / Trunk Assessment Cervical / Trunk Assessment: Normal  Communication   Communication: No difficulties  Cognition Arousal/Alertness: Awake/alert Behavior During Therapy: WFL for tasks assessed/performed Overall Cognitive Status: Within Functional Limits for tasks assessed                 General Comments General comments (skin integrity, edema, etc.): pt was educated on walking program    Exercises     Assessment/Plan    PT Assessment Patent does not need any further PT services  PT Problem List         PT Treatment Interventions      PT Goals (Current goals can be found in the Care Plan section)  Acute Rehab PT Goals Patient Stated Goal: get home and be pain free PT Goal Formulation: With patient Time For Goal Achievement: 06/27/20 Potential to Achieve Goals: Good    Frequency     Barriers to discharge        Co-evaluation               AM-PAC PT "6 Clicks" Mobility  Outcome Measure Help needed turning from your back to your side while in a flat bed without using bedrails?: None Help needed moving from lying on your back to sitting on the side of a flat bed without using bedrails?: None Help needed moving to and from a bed to a chair (including a wheelchair)?: None Help needed standing up from a chair using your arms (e.g., wheelchair or bedside chair)?:  None Help needed to walk in hospital room?: A Little Help needed climbing 3-5 steps with a railing? : A Little 6 Click Score: 22    End of Session Equipment Utilized During Treatment: Gait belt Activity Tolerance: Patient tolerated treatment well Patient left: in bed;with call Tim Torres/phone within reach;with bed alarm set Nurse Communication: Mobility status PT Visit Diagnosis: Pain;Muscle weakness (generalized) (M62.81) Pain - part of body:  (back)    Time: 7017-7939 PT Time Calculation (min) (ACUTE ONLY): 25 min   Charges:   PT Evaluation $PT Eval Low Complexity: 1 Low PT Treatments $Gait Training: 8-22 mins       Harmon Pier, SPT  Acute Rehabilitation Services  Office: [phone]  06/27/2020, 6:15 PM

## 2020-06-27 NOTE — Brief Op Note (Signed)
06/27/2020  10:40 AM  PATIENT:  Tim Torres  69 y.o. male  PRE-OPERATIVE DIAGNOSIS:  Spondylolisthesis  POST-OPERATIVE DIAGNOSIS:  Spondylolisthesis  PROCEDURE:  Procedure(s) with comments: Posterior Lumbar Interbody Fusion - Lumbar Four- Lumbar Five (N/A) - Posterior Lumbar Interbody Fusion - Lumbar Four- Lumbar Five  SURGEON:  Surgeon(s) and Role:    Julio Sicks, MD - Primary  PHYSICIAN ASSISTANT:   ASSISTANTSMarland Mcalpine   ANESTHESIA:   general  EBL:  100 mL   BLOOD ADMINISTERED:none  DRAINS: none   LOCAL MEDICATIONS USED:  MARCAINE     SPECIMEN:  No Specimen  DISPOSITION OF SPECIMEN:  N/A  COUNTS:  YES  TOURNIQUET:  * No tourniquets in log *  DICTATION: .Dragon Dictation  PLAN OF CARE: Admit for overnight observation  PATIENT DISPOSITION:  PACU - hemodynamically stable.   Delay start of Pharmacological VTE agent (>24hrs) due to surgical blood loss or risk of bleeding: yes

## 2020-06-27 NOTE — Transfer of Care (Signed)
Immediate Anesthesia Transfer of Care Note  Patient: Tim Torres  Procedure(s) Performed: Posterior Lumbar Interbody Fusion - Lumbar Four- Lumbar Five (N/A Back)  Patient Location: PACU  Anesthesia Type:General  Level of Consciousness: awake, alert , oriented and patient cooperative  Airway & Oxygen Therapy: Patient Spontanous Breathing and Patient connected to face mask oxygen  Post-op Assessment: Report given to RN, Post -op Vital signs reviewed and stable and Patient moving all extremities  Post vital signs: Reviewed and stable  Last Vitals:  Vitals Value Taken Time  BP 147/69 06/27/20 1054  Temp 36.4 C 06/27/20 1053  Pulse 69 06/27/20 1058  Resp 17 06/27/20 1058  SpO2 94 % 06/27/20 1058  Vitals shown include unvalidated device data.  Last Pain:  Vitals:   06/27/20 0657  TempSrc:   PainSc: 4       Patients Stated Pain Goal: 4 (06/27/20 0657)  Complications: No complications documented.

## 2020-06-27 NOTE — Op Note (Signed)
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  Postoperative diagnosis: Same  Procedure Name: Bilateral L4-5 redo decompressive laminotomies and facetectomies, more than would be required for simple interbody fusion alone.  L4-5 posterior lumbar interbody fusion utilizing interbody cages and locally harvested autograft  L4-5 posterior lateral arthrodesis utilizing nonsegmental pedicle screw fixation and local autograft.  Surgeon:Tim Torres, M.D.  Asst. Surgeon: Doran Durand, NP  Anesthesia: General  Indication: 69 year old male status post prior left-sided L4-5 laminotomy and microdiscectomy and right-sided L4-5 extraforaminal microdiscectomy.  Patient presents now with severe back and lower extremity pain failing conservative management.  Work-up demonstrates evidence of progressive disc degeneration with disc base collapse and anterior listhesis of L4 and L5.  Patient with severe foraminal stenosis bilaterally.  Patient is failed conservative management presents now for lumbar decompression and fusion in hopes of improving his symptoms.  Operative note: After induction of anesthesia, patient position prone on the Wilson frame appropriate padded.  Lumbar region prepped and draped sterilely.  Incision made overlying L4-5.  Dissection performed bilaterally.  Retractor placed.  Fluoroscopy used.  Levels confirmed.  Previous laminotomy site on the left at L4-5 was dissected free.  Laminotomy was then widened using Kerrison rongeurs and Leksell rongeurs to remove the inferior two thirds of the lamina of L4 the entire inferior facet and pars interarticularis of L4 bilaterally and the superior aspect of the superior articular process of L5 bilaterally and the superior aspect of the L5 lamina.  Ligament flavum and epidural scar were elevated and  resected.  Underlying thecal sac and exiting L4 and L5 nerve roots were done 5 bilaterally.  Wide decompressive foraminotomies were completed along the course of each nerve root.  Bilateral discectomies were then performed.  The space then prepared for interbody fusion.  With a distractor placed patient's right side to space was further cleaned of all soft tissue.  A 9 mm [employer] expandable cage packed with locally harvested autograft then impacted into place and expanded to its full extent.  Distractor removed patient's right side.  Dissipates then prepared on the right side.  Morselized autograft packed in the interspace.  Second cage packed with autograft was then impacted into place and expanded to its full extent.  Pedicles of L4 and L5 were then identified using surface landmarks and intraoperative fluoroscopy and superficial bone around the pedicle was then removed using high-speed drill.  Pedicle was then probed using a pedicle all each pedicle tract was then probed and found to be solidly within the bone.  Each pedicle all track was then tapped with a screw tap.  Each screw temple was probed and found to be solidly than the bone.  5.75 mm radius screws from [employer] were placed bilaterally at L4 and L5.  Final images reveal good position of the cages and the hardware at the proper upper level with normal alignment of the spine.  Transverse processes and residual facets were decorticated.  Morselized autograft was packed posterior laterally.  A short segment of titanium rod placed over the screw heads at L4 and L5.  Locking caps placed over the screws.  Locking caps and engaged with a construct under compression.  Gelfoam placed over the laminotomy defects.  Vancomycin powder placed in deep wound space.  Wounds and closed in layers with Vicryl sutures.  Steri-Strips and sterile dressing were applied.  No apparent complications.  Patient tolerated the  procedure well and he returns to the recovery  room postop.

## 2020-06-27 NOTE — H&P (Signed)
Tim Torres is an 69 y.o. male.   Chief Complaint: Back pain HPI: [age]-year-old male with progressive worsening back and bilateral lower extremity symptoms right greater than left.  Work-up demonstrates evidence of progressive degenerative spondylolisthesis at L4-5 where he has had prior left-sided laminotomy microdiscectomy and right-sided extraforaminal microdiscectomy.  The patient has failed conservative management.  He presents now for decompression and fusion in hopes of improving his symptoms.  Past Medical History:  Diagnosis Date  . COVID-19 10/21/2019  . Diabetes mellitus without complication ([facility])   . Hypertension   . Myocardial infarction [facility]) 1997   per cardiology records, MI 11/16/96, s/p PTCS distal CX  . Sleep apnea    per patient "does not wear CPAP, unable to tolerate"    Past Surgical History:  Procedure Laterality Date  . ANGIOPLASTY     per patient "back in 1997"  . BACK SURGERY      History reviewed. No pertinent family history. Social History:  reports that he quit smoking about 37 years ago. He has never used smokeless tobacco. He reports current alcohol use of about 1.0 standard drink of alcohol per week. He reports that he does not use drugs.  Allergies:  Allergies  Allergen Reactions  . Shellfish Allergy Shortness Of Breath and Swelling  . Atorvastatin Other (See Comments)    Myalgia   . Rosuvastatin Other (See Comments)    myalgia    Medications Prior to Admission  Medication Sig Dispense Refill  . allopurinol (ZYLOPRIM) 300 MG tablet Take 300 mg by mouth daily.    Marland Kitchen aspirin 325 MG tablet Take 325 mg by mouth daily.    Marland Kitchen lovastatin (MEVACOR) 40 MG tablet Take 40 mg by mouth 2 (two) times daily.     . metFORMIN (GLUCOPHAGE) 500 MG tablet Take 500 mg by mouth in the morning, at noon, and at bedtime.     . metoprolol tartrate (LOPRESSOR) 50 MG tablet Take 50 mg by mouth 2 (two) times daily.     Marland Kitchen zolpidem (AMBIEN) 5 MG tablet Take 5 mg by  mouth at bedtime as needed for sleep.     . meclizine (ANTIVERT) 25 MG tablet Take 1 tablet (25 mg total) by mouth 3 (three) times daily as needed for dizziness or nausea. 30 tablet 0  . ondansetron (ZOFRAN) 4 MG tablet Take 1 tablet (4 mg total) by mouth every 6 (six) hours as needed for nausea. (Patient not taking: Reported on 06/20/2020) 20 tablet 0    Results for orders placed or performed during the hospital encounter of 06/27/20 (from the past 48 hour(s))  Glucose, capillary     Status: Abnormal   Collection Time: 06/27/20  6:32 AM  Result Value Ref Range   Glucose-Capillary 139 (H) 70 - 99 mg/dL    Comment: Glucose reference range applies only to samples taken after fasting for at least 8 hours.   No results found.  Pertinent items noted in HPI and remainder of comprehensive ROS otherwise negative.  Blood pressure 131/75, pulse (!) 54, temperature (!) 97.5 F (36.4 C), temperature source Temporal, resp. rate 18, height 5' 11.5" (1.816 m), weight 111.1 kg, SpO2 98 %.  Patient is awake and alert.  He is oriented and appropriate.  Speech is fluent.  Judgment insight are intact.  Cranial nerve function normal bilateral.  Motor examination reveals some mild weakness of dorsiflexion on the left side otherwise motor strength intact.  Sensory examination with decreased sensation pinprick and light  touch in bilateral L5 dermatomes.  Deep tender flexes are normal active.  No evidence of long track signs.  Gait is antalgic.  Posture is mildly flexed.  Examination head ears eyes nose throat is unremarkable her chest and abdomen are benign.  Extremities are free from injury deformity. Assessment/Plan L4-5 degenerative spondylolisthesis with stenosis and intractable back and radicular pain.  Plan bilateral L4-5 redo decompressive laminotomies and foraminotomies followed by posterior lumbar interbody fusion utilizing interbody cages, local harvested autograft, and augmented with posterior lateral  arthrodesis utilizing nonsegmental pedicle screw fixation and local autografting.  Risks and benefits been explained.  Patient wishes to proceed.  Sherilyn Cooter A Brently Voorhis 06/27/2020, 7:51 AM

## 2020-06-27 NOTE — Anesthesia Procedure Notes (Signed)
Procedure Name: Intubation Date/Time: 06/27/2020 8:10 AM Performed by: Myna Bright, CRNA Pre-anesthesia Checklist: Patient identified, Emergency Drugs available, Suction available and Patient being monitored Patient Re-evaluated:Patient Re-evaluated prior to induction Oxygen Delivery Method: Circle system utilized Preoxygenation: Pre-oxygenation with 100% oxygen Induction Type: IV induction Ventilation: Mask ventilation without difficulty Laryngoscope Size: Mac and 4 Grade View: Grade II Tube type: Oral Tube size: 7.5 mm Number of attempts: 1 Airway Equipment and Method: Stylet Placement Confirmation: ETT inserted through vocal cords under direct vision,  positive ETCO2 and breath sounds checked- equal and bilateral Secured at: 22 cm Tube secured with: Tape Dental Injury: Teeth and Oropharynx as per pre-operative assessment

## 2020-06-28 DIAGNOSIS — M48062 Spinal stenosis, lumbar region with neurogenic claudication: Secondary | ICD-10-CM | POA: Diagnosis not present

## 2020-06-28 LAB — GLUCOSE, CAPILLARY: Glucose-Capillary: 157 mg/dL — ABNORMAL HIGH (ref 70–99)

## 2020-06-28 MED ORDER — HYDROCODONE-ACETAMINOPHEN 10-325 MG PO TABS: 1.0000 | ORAL_TABLET | ORAL | 0 refills | Status: DC | PRN

## 2020-06-28 MED ORDER — DIAZEPAM 2 MG PO TABS: 2.0000 mg | ORAL_TABLET | Freq: Three times a day (TID) | ORAL | 0 refills | Status: AC | PRN

## 2020-06-28 NOTE — Evaluation (Addendum)
Occupational Therapy Evaluation Patient Details Name: Tim Torres MRN: [id number] DOB: 24-Mar-1951 Today's Date: 06/28/2020    History of Present Illness Pt is a 69 yo male who is s/p PLIF of L4/5. Pt has a PMH of T2DM, HTN, MI and a hx of 4 lumbar/thoracic spinal surgeries   Clinical Impression   This 69 y/o male presents with the above. PTA pt reports being indpenedent with ADL and moblity tasks. Pt dressed upon arrival today. Overall pt performing functional mobility with RW at supervision level, he requires up to [facility] for LB ADL (without AE) given difficulty reaching LEs while maintaining back precautions. Educated pt in use of AE for increasing safety and independence with ADL completion. Further educated/reinforced pt re: back precautions, brace management, safety and compensatory techniques for completing ADL and mobility tasks; pt verbalizing understanding but intermittently appears to have decreased receptiveness to education/strategies provided. He reports plans to have assistance initially from significant other and son for ADL/iADL PRN. Pt will benefit from continued OT services while in acute setting to further reinforce back precautions, safety and to maximize his overall independence with ADL and mobility. Will follow.     Follow Up Recommendations  No OT follow up;Supervision/Assistance - 24 hour    Equipment Recommendations  None recommended by OT (pt's DME needs are met )           Precautions / Restrictions Precautions Precautions: Back Precaution Booklet Issued: Yes (comment) Precaution Comments: pt able to verbalize spinal precautions  Required Braces or Orthoses: Spinal Brace Spinal Brace: Lumbar corset;Applied in sitting position Restrictions Weight Bearing Restrictions: No      Mobility Bed Mobility Overal bed mobility: Needs Assistance             General bed mobility comments: OOB upon arrival, pt able to verbalize log roll  technique  Transfers Overall transfer level: Needs assistance Equipment used: Rolling walker (2 wheeled) Transfers: Sit to/from Stand Sit to Stand: Supervision         General transfer comment: supervision for safety, pt adamant to have EOB height increased even when transitioning to sitting     Balance Overall balance assessment: Needs assistance Sitting-balance support: No upper extremity supported;Feet supported Sitting balance-Leahy Scale: Fair Sitting balance - Comments: pt able to sit EOB without UE support   Standing balance support: During functional activity;Bilateral upper extremity supported;No upper extremity supported Standing balance-Leahy Scale: Fair                             ADL either performed or assessed with clinical judgement   ADL Overall ADL's : Needs assistance/impaired Eating/Feeding: Modified independent;Sitting   Grooming: Min guard;Standing   Upper Body Bathing: Supervision/ safety;Sitting;Standing   Lower Body Bathing: Minimal assistance;Sitting/lateral leans;Sit to/from stand Lower Body Bathing Details (indicate cue type and reason): educated in benefits/safety of sitting for bathing task however pt fairly adamant that he will stand to perform. educated to have shower seat in shower for added safety and to increase ease of reaching LEs, educated in use of AE for task  Upper Body Dressing : Set up;Sitting   Lower Body Dressing: Moderate assistance;Sit to/from stand Lower Body Dressing Details (indicate cue type and reason): modA without AE as pt with difficulty reaching feet, pt reports plans to get reacher for home - educated/demonstrated how to use reacher for LB dressing task, pt with slip on shoes  Toilet Transfer: Supervision/safety;Ambulation;RW Toilet Transfer Details (indicate cue  type and reason): simulated via transfer to [place] and Hygiene: Min guard;Sitting/lateral lean;Sit to/from Psychologist, educational Details (indicate cue type and reason): educated in safe transfer techniques, to use grab bar for stability and methods to maintain precautions with transitions. recommend pt have son present initially during transfers to ensure safety. pt verbalizing understanding Functional mobility during ADLs: Supervision/safety;Rolling walker General ADL Comments: pt acknowleding education provided but occasionally not receptive, insistent on performing tasks with his preferred method - educated/reinforced back precautions to ensure carryover with ADL/moblity tasks                         Pertinent Vitals/Pain Pain Assessment: Faces Faces Pain Scale: Hurts little more Pain Location: back Pain Descriptors / Indicators: Sore;Operative site guarding;Guarding Pain Intervention(s): Monitored during session;Repositioned     Hand Dominance     Extremity/Trunk Assessment Upper Extremity Assessment Upper Extremity Assessment: Overall WFL for tasks assessed   Lower Extremity Assessment Lower Extremity Assessment: Defer to PT evaluation       Communication Communication Communication: No difficulties   Cognition Arousal/Alertness: Awake/alert Behavior During Therapy: WFL for tasks assessed/performed Overall Cognitive Status: Within Functional Limits for tasks assessed                                     General Comments  incision/dressing unobserved     Exercises     Shoulder Instructions      Home Living Family/patient expects to be discharged to:: Private residence Living Arrangements: Alone Available Help at Discharge: Family;Friend(s) Type of Home: House Home Access: Stairs to enter [employer] of Steps: 2 Entrance Stairs-Rails: Right Home Layout: One level     Bathroom Shower/Tub: Teacher, early years/pre: Handicapped height Bathroom Accessibility: Yes   Home Equipment: Environmental consultant - 2 wheels;Walker - 4 wheels;Cane -  single point;Shower seat;Grab bars - tub/shower   Additional Comments: pt lives alone but will have his girlfriend and son avaliable for assistance 24/7      Prior Functioning/Environment Level of Independence: Independent                 OT Problem List: Decreased strength;Decreased activity tolerance;Impaired balance (sitting and/or standing);Decreased knowledge of use of DME or AE;Decreased knowledge of precautions;Pain;Decreased safety awareness      OT Treatment/Interventions: Self-care/ADL training;Therapeutic exercise;Energy conservation;DME and/or AE instruction;Therapeutic activities;Patient/family education;Balance training;Cognitive remediation/compensation    OT Goals(Current goals can be found in the care plan section) Acute Rehab OT Goals Patient Stated Goal: get home and be pain free OT Goal Formulation: With patient Time For Goal Achievement: 07/12/20 Potential to Achieve Goals: Good  OT Frequency: Min 2X/week   Barriers to D/C:            Co-evaluation              AM-PAC OT "6 Clicks" Daily Activity     Outcome Measure Help from another person eating meals?: None Help from another person taking care of personal grooming?: A Little Help from another person toileting, which includes using toliet, bedpan, or urinal?: A Little Help from another person bathing (including washing, rinsing, drying)?: A Little Help from another person to put on and taking off regular upper body clothing?: A Little Help from another person to put on and taking off regular lower body clothing?: A Lot 6 Click Score:  18   End of Session Equipment Utilized During Treatment: Rolling walker;Back brace Nurse Communication: Mobility status  Activity Tolerance: Patient tolerated treatment well Patient left: with call bell/phone within reach;Other (comment) (seated EOB)  OT Visit Diagnosis: Other abnormalities of gait and mobility (R26.89);Pain Pain - part of body:  (back)                 Time: 8757-9728 OT Time Calculation (min): 13 min Charges:  OT General Charges $OT Visit: 1 Visit OT Evaluation $OT Eval Low Complexity: [place], OT Acute Rehabilitation Services Pager [phone] Office [phone]   Raymondo Band 06/28/2020, 10:17 AM

## 2020-06-28 NOTE — Plan of Care (Signed)
Patient alert and oriented, mae's well, voiding adequate amount of urine, swallowing without difficulty, no c/o pain at time of discharge. Patient discharged home with family. Script and discharged instructions given to patient. Patient and family stated understanding of instructions given. Patient has an appointment with Dr. Pool  

## 2020-06-28 NOTE — Discharge Summary (Signed)
Physician Discharge Summary  Patient ID: Tim Torres MRN: [id number] DOB/AGE: 67-Feb-1952 67 y.o.  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   Discharge Diagnoses: same   Discharged Condition: good  Hospital Course: The patient was admitted on 06/27/2020 and taken to the operating room where the patient underwent PLIF L4-5. The patient tolerated the procedure well and was taken to the recovery room and then to the floor in stable condition. The hospital course was routine. There were no complications. The wound remained clean dry and intact. Pt had appropriate back soreness. No complaints of leg pain or new N/T/W. The patient remained afebrile with stable vital signs, and tolerated a regular diet. The patient continued to increase activities, and pain was well controlled with oral pain medications.   Consults: None  Significant Diagnostic Studies:  Results for orders placed or performed during the hospital encounter of 06/27/20  Glucose, capillary  Result Value Ref Range   Glucose-Capillary 139 (H) 70 - 99 mg/dL  Glucose, capillary  Result Value Ref Range   Glucose-Capillary 179 (H) 70 - 99 mg/dL  Glucose, capillary  Result Value Ref Range   Glucose-Capillary 288 (H) 70 - 99 mg/dL   Comment 1 Notify RN    Comment 2 Document in Chart   Glucose, capillary  Result Value Ref Range   Glucose-Capillary 158 (H) 70 - 99 mg/dL   Comment 1 Notify RN    Comment 2 Document in Chart   Glucose, capillary  Result Value Ref Range   Glucose-Capillary 157 (H) 70 - 99 mg/dL   Comment 1 Notify RN    Comment 2 Document in Chart     DG Lumbar Spine 2-3 Views  Result Date: 06/27/2020 CLINICAL DATA:  Posterior interbody fusion L4 and L5 EXAM: LUMBAR SPINE - 2-3 VIEW; DG C-ARM 1-60 MIN COMPARISON:  Lumbar CT postmyelogram; lumbar myelogram May 20, 2020 May 20, 2020  FLUOROSCOPY TIME:  0 minutes 27.3 seconds; 14.48 mGy; 2 acquired images FINDINGS: Frontal and lateral views were obtained. There is posterior screw fixation at L4 and L5 with pedicle screws within the respective vertebral bodies. Disc spacer noted at L4-5. No fracture or spondylolisthesis. Visualized disc spaces appear intact. IMPRESSION: Postoperative changes at L4 and L5 with visualized support hardware intact. No fracture or spondylolisthesis. No appreciable disc space narrowing following disc spacer placement. Electronically Signed   By: Bretta Bang III M.D.   On: 06/27/2020 10:41   DG C-Arm 1-60 Min  Result Date: 06/27/2020 CLINICAL DATA:  Posterior interbody fusion L4 and L5 EXAM: LUMBAR SPINE - 2-3 VIEW; DG C-ARM 1-60 MIN COMPARISON:  Lumbar CT postmyelogram; lumbar myelogram May 20, 2020 May 20, 2020 FLUOROSCOPY TIME:  0 minutes 27.3 seconds; 14.48 mGy; 2 acquired images FINDINGS: Frontal and lateral views were obtained. There is posterior screw fixation at L4 and L5 with pedicle screws within the respective vertebral bodies. Disc spacer noted at L4-5. No fracture or spondylolisthesis. Visualized disc spaces appear intact. IMPRESSION: Postoperative changes at L4 and L5 with visualized support hardware intact. No fracture or spondylolisthesis. No appreciable disc space narrowing following disc spacer placement. Electronically Signed   By: Bretta Bang III M.D.   On: 06/27/2020 10:41    Antibiotics:  Anti-infectives (From admission, onward)   Start     Dose/Rate Route Frequency Ordered Stop   06/27/20 1630  ceFAZolin (ANCEF) IVPB 1 g/50 mL premix  1 g 100 mL/hr over 30 Minutes Intravenous Every 8 hours 06/27/20 1201 06/28/20 0009   06/27/20 1020  vancomycin (VANCOCIN) powder  Status:  Discontinued          As needed 06/27/20 1020 06/27/20 1048   06/27/20 0916  bacitracin 50,000 Units in sodium chloride 0.9 % 500 mL irrigation  Status:  Discontinued          As needed 06/27/20  0917 06/27/20 1048   06/27/20 0600  vancomycin (VANCOREADY) IVPB 1500 mg/300 mL  Status:  Discontinued        1,500 mg 150 mL/hr over 120 Minutes Intravenous To Surgery 06/26/20 0805 06/27/20 1158   06/27/20 0600  ceFAZolin (ANCEF) IVPB 2g/100 mL premix        2 g 200 mL/hr over 30 Minutes Intravenous On call to O.R. 06/27/20 0555 06/27/20 0829      Discharge Exam: Blood pressure 131/64, pulse 65, temperature 97.8 F (36.6 C), temperature source Oral, resp. rate 16, height 5' 11.5" (1.816 m), weight 111.1 kg, SpO2 100 %. Neurologic: Grossly normal Ambulating and voiding well, incision cdi  Discharge Medications:   Allergies as of 06/28/2020      Reactions   Shellfish Allergy Shortness Of Breath, Swelling   Atorvastatin Other (See Comments)   Myalgia    Rosuvastatin Other (See Comments)   myalgia      Medication List    TAKE these medications   allopurinol 300 MG tablet Commonly known as: ZYLOPRIM Take 300 mg by mouth daily.   aspirin 325 MG tablet Take 325 mg by mouth daily.   diazepam 2 MG tablet Commonly known as: Valium Take 1 tablet (2 mg total) by mouth every 8 (eight) hours as needed for anxiety.   HYDROcodone-acetaminophen 10-325 MG tablet Commonly known as: NORCO Take 1 tablet by mouth every 4 (four) hours as needed for moderate pain ((score 4 to 6)).   lovastatin 40 MG tablet Commonly known as: MEVACOR Take 40 mg by mouth 2 (two) times daily.   meclizine 25 MG tablet Commonly known as: ANTIVERT Take 1 tablet (25 mg total) by mouth 3 (three) times daily as needed for dizziness or nausea.   metFORMIN 500 MG tablet Commonly known as: GLUCOPHAGE Take 500 mg by mouth in the morning, at noon, and at bedtime.   metoprolol tartrate 50 MG tablet Commonly known as: LOPRESSOR Take 50 mg by mouth 2 (two) times daily.   ondansetron 4 MG tablet Commonly known as: ZOFRAN Take 1 tablet (4 mg total) by mouth every 6 (six) hours as needed for nausea.   zolpidem 5  MG tablet Commonly known as: AMBIEN Take 5 mg by mouth at bedtime as needed for sleep.            Durable Medical Equipment  (From admission, onward)         Start     Ordered   06/27/20 1202  DME Walker rolling  Once       Question:  Patient needs a walker to treat with the following condition  Answer:  Degenerative spondylolisthesis   06/27/20 1201   06/27/20 1202  DME 3 n 1  Once        06/27/20 1201          Disposition: home   Final Dx: PLIF L4-5  Discharge Instructions     Remove dressing in 72 hours   Complete by: As directed    Call MD for:  difficulty breathing, headache  or visual disturbances   Complete by: As directed    Call MD for:  hives   Complete by: As directed    Call MD for:  persistant dizziness or light-headedness   Complete by: As directed    Call MD for:  persistant nausea and vomiting   Complete by: As directed    Call MD for:  redness, tenderness, or signs of infection (pain, swelling, redness, odor or green/yellow discharge around incision site)   Complete by: As directed    Call MD for:  severe uncontrolled pain   Complete by: As directed    Call MD for:  temperature >100.4   Complete by: As directed    Diet - low sodium heart healthy   Complete by: As directed    Increase activity slowly   Complete by: As directed          Signed: Tiana Loft Eleyna Brugh 06/28/2020, 8:16 AM

## 2020-06-28 NOTE — Anesthesia Postprocedure Evaluation (Signed)
Anesthesia Post Note  Patient: Tim Torres  Procedure(s) Performed: Posterior Lumbar Interbody Fusion - Lumbar Four- Lumbar Five (N/A Back)     Patient location during evaluation: PACU Anesthesia Type: General Level of consciousness: awake and alert Pain management: pain level controlled Vital Signs Assessment: post-procedure vital signs reviewed and stable Respiratory status: spontaneous breathing, nonlabored ventilation, respiratory function stable and patient connected to nasal cannula oxygen Cardiovascular status: blood pressure returned to baseline and stable Postop Assessment: no apparent nausea or vomiting Anesthetic complications: no   No complications documented.  Last Vitals:  Vitals:   06/28/20 0320 06/28/20 0756  BP: (!) 111/50 131/64  Pulse: 64 65  Resp: 18 16  Temp: 36.7 C 36.6 C  SpO2: 100% 100%    Last Pain:  Vitals:   06/28/20 0756  TempSrc: Oral  PainSc:                  Mellody Dance

## 2020-06-30 MED FILL — Sodium Chloride IV Soln 0.9%: INTRAVENOUS | Qty: 1000 | Status: AC

## 2020-06-30 MED FILL — Heparin Sodium (Porcine) Inj 1000 Unit/ML: INTRAMUSCULAR | Qty: 30 | Status: AC

## 2020-09-17 ENCOUNTER — Ambulatory Visit: Payer: Medicare HMO | Attending: Neurosurgery | Admitting: Physical Therapy

## 2020-09-17 ENCOUNTER — Other Ambulatory Visit: Payer: Self-pay

## 2020-09-17 DIAGNOSIS — G8929 Other chronic pain: Secondary | ICD-10-CM | POA: Insufficient documentation

## 2020-09-17 DIAGNOSIS — M545 Low back pain, unspecified: Secondary | ICD-10-CM | POA: Insufficient documentation

## 2020-09-17 DIAGNOSIS — M6281 Muscle weakness (generalized): Secondary | ICD-10-CM | POA: Diagnosis not present

## 2020-09-17 NOTE — Therapy (Signed)
[facility] [address] [place], [place], [address] Phone: [phone]   Fax:  [phone]  Physical Therapy Evaluation  Patient Details  Name: Tim Torres MRN: [id number] Date of Birth: 06-22-1951 Referring Provider (PT): Julio Sicks MD   Encounter Date: 09/17/2020   PT End of Session - 09/17/20 0946    Visit Number 1    Number of Visits 16    Date for PT Re-Evaluation 12/17/20    Authorization Type FOTO AT LEAST EVERY 5TH VISIT.  PROGRESS NOTE AT 10TH VISIT.    PT Start Time 0815    PT Stop Time 0855    PT Time Calculation (min) 40 min    Activity Tolerance Patient tolerated treatment well    Behavior During Therapy [facility] for tasks assessed/performed           Past Medical History:  Diagnosis Date   COVID-19 10/21/2019   Diabetes mellitus without complication (HCC)    Hypertension    Myocardial infarction [facility]) 1997   per cardiology records, MI 11/16/96, s/p PTCS distal CX   Sleep apnea    per patient "does not wear CPAP, unable to tolerate"    Past Surgical History:  Procedure Laterality Date   ANGIOPLASTY     per patient "back in 1997"   BACK SURGERY      There were no vitals filed for this visit.    Subjective Assessment - 09/17/20 0840    Subjective COVID-19 screen performed prior to patient entering clinic.  The patient presents to the s/p lumbar L4-5 fusion performed on 06/27/20.  Prior to this he had a discectomy in September of 2020.  He is pleased with the outcome of this surgery and feels more mobile.  He rates his pain at a 6/10 today but his pain can rise to 8/10 with increase walking.  He reports the right side of his back feels tight.  Rest decreases his pain.    Pertinent History HTN, DM, 3 previous spinal surgeries.    How long can you walk comfortably? Short community distances.    Patient Stated Goals Be outdoors.  Hunt.    Currently in Pain? Yes    Pain Score 6     Pain Location Back     Pain Orientation Right    Pain Descriptors / Indicators Aching;Sore;Numbness    Pain Type Chronic pain    Pain Onset More than a month ago              [facility] PT Assessment - 09/17/20 0001      Assessment   Medical Diagnosis Spondylolisthesis    Referring Provider (PT) Julio Sicks MD    Onset Date/Surgical Date --   06/27/20(surgery date).     Precautions   Precaution Comments Follow lumbar fusion protocol.      Restrictions   Weight Bearing Restrictions No      Balance Screen   Has the patient fallen in the past 6 months No    Has the patient had a decrease in activity level because of a fear of falling?  No    Is the patient reluctant to leave their home because of a fear of falling?  No      Home Environment   Living Environment Private residence      Prior Function   Level of Independence Independent      Observation/Other Assessments   Focus on Therapeutic Outcomes (FOTO)  63% limitation.  Posture/Postural Control   Posture/Postural Control Postural limitations    Postural Limitations Rounded Shoulders;Forward head;Decreased lumbar lordosis      ROM / Strength   AROM / PROM / Strength AROM;Strength      AROM   Overall AROM Comments In supine righ hip flexion is 95 degrees and left is 115 degrees.      Strength   Right/Left Hip Right    Right Hip Flexion 3+/5    Right/Left Knee Right    Right Knee Flexion 4+/5    Right Knee Extension 4+/5    Right Ankle Dorsiflexion --   1 to 1+/5.     Palpation   Palpation comment tender with a significant amount of tone right of his lumbar incision.      Ambulation/Gait   Gait Comments Purposeful gait with decreased right ankle dorsiflexion.                      Objective measurements completed on examination: See above findings.       [facility] Adult PT Treatment/Exercise - 09/17/20 0001      Moist Heat Therapy   Number Minutes Moist Heat 15 Minutes    Moist Heat Location Lumbar Spine       Electrical Stimulation   Electrical Stimulation Location Right lower lumbar    Electrical Stimulation Action Pre-mod.    Electrical Stimulation Parameters 80-150 Hz x 15 minutes.    Electrical Stimulation Goals Tone;Pain                       PT Long Term Goals - 09/17/20 1023      PT LONG TERM GOAL #1   Title Independent with a HEP.    Time 8    Period Weeks    Status New      PT LONG TERM GOAL #2   Title Increase right ankle dorsiflexion to 6- 8 degrees to normalize the patients gait pattern.    Time 8    Period Weeks    Status New      PT LONG TERM GOAL #3   Title Right LE strength 4+/5.    Time 8    Period Weeks    Status New      PT LONG TERM GOAL #4   Title Walk 250 feet with normal dorsiflexion.    Time 8    Period Weeks    Status New      PT LONG TERM GOAL #5   Title Perform a reciprocating stair gait with one railing.    Time 8    Period Weeks    Status New                  Plan - 09/17/20 1018    Clinical Impression Statement The patient presenst to OPPT s/p L4-5 fusion performed on 06/27/20.  She is pleased with his progress thus far.  He has increased right low back tenderness and tone and a decrease in right hip flexion compared to left.  His right hip and ankle remains weak but he feel more mobile.  His FOTO limitation score is 63%.  He has decreased right ankle dorsiflexion during his gait cycle.  His functional mobility is currently impaired.  Patient will benefit from skilled physical therapy intervention to address deficits and pain.    Personal Factors and Comorbidities Comorbidity 1;Comorbidity 2    Comorbidities HTN, DM, 2 previous spinal surgeries.  Examination-Activity Limitations Locomotion Level;Other;Sleep    Examination-Participation Restrictions Other    Stability/Clinical Decision Making Stable/Uncomplicated    Clinical Decision Making Low    Rehab Potential Good    PT Duration 8 weeks    PT  Treatment/Interventions ADLs/Self Care Home Management;Cryotherapy;Electrical Stimulation;Gait training;Ultrasound;Moist Heat;Stair training;Functional mobility training;Therapeutic activities;Therapeutic exercise;Neuromuscular re-education;Patient/family education;Passive range of motion;Manual techniques    PT Next Visit Plan Progress per lumbar fusion protocol.  STW/M to right low back muscualture.    Consulted and Agree with Plan of Care Patient           Patient will benefit from skilled therapeutic intervention in order to improve the following deficits and impairments:  Pain, Decreased activity tolerance, Abnormal gait, Decreased strength, Increased muscle spasms  Visit Diagnosis: Muscle weakness (generalized) - Plan: PT plan of care cert/re-cert     Problem List Patient Active Problem List   Diagnosis Date Noted   Degenerative spondylolisthesis 06/27/2020   DJD (degenerative joint disease) 09/24/2019   Dizziness 09/23/2019   HTN (hypertension) 09/23/2019   HLD (hyperlipidemia) 09/23/2019   CAD (coronary artery disease) 09/23/2019   DM2 (diabetes mellitus, type 2) (HCC) 09/23/2019    Lilyanah Celestin, [place] MPT 09/17/2020, 10:24 AM  [facility] [address] [place], [place], [address] Phone: [phone]   Fax:  [phone]  Name: Tim Torres MRN: [id number] Date of Birth: [birth year]

## 2020-09-23 ENCOUNTER — Other Ambulatory Visit: Payer: Self-pay

## 2020-09-23 ENCOUNTER — Ambulatory Visit: Payer: Medicare HMO | Admitting: Physical Therapy

## 2020-09-23 DIAGNOSIS — G8929 Other chronic pain: Secondary | ICD-10-CM

## 2020-09-23 DIAGNOSIS — M6281 Muscle weakness (generalized): Secondary | ICD-10-CM

## 2020-09-23 NOTE — Therapy (Signed)
[facility] [address] [place], [place], [address] Phone: [phone]   Fax:  [phone]  Physical Therapy Treatment  Patient Details  Name: Tim Torres MRN: [id number] Date of Birth: [birth year] Referring Provider (PT): Julio Sicks MD   Encounter Date: 09/23/2020   PT End of Session - 09/23/20 0851    Visit Number 2    Number of Visits 16    Date for PT Re-Evaluation 12/17/20    Authorization Type FOTO AT LEAST EVERY 5TH VISIT.  PROGRESS NOTE AT 10TH VISIT.    PT Start Time 0815    PT Stop Time 0905    PT Time Calculation (min) 50 min    Activity Tolerance Patient tolerated treatment well           Past Medical History:  Diagnosis Date   COVID-19 10/21/2019   Diabetes mellitus without complication (HCC)    Hypertension    Myocardial infarction [facility]) 1997   per cardiology records, [place] 11/16/96, s/p PTCS distal CX   Sleep apnea    per patient "does not wear CPAP, unable to tolerate"    Past Surgical History:  Procedure Laterality Date   ANGIOPLASTY     per patient "back in 1997"   BACK SURGERY      There were no vitals filed for this visit.   Subjective Assessment - 09/23/20 0817    Subjective COVID-19 screen performed prior to patient entering clinic. No new complaints.    Patient is accompained by: Interpreter    Pertinent History HTN, DM, 3 previous spinal surgeries.    How long can you walk comfortably? Short community distances.    Patient Stated Goals Be outdoors.  Hunt.    Currently in Pain? Yes    Pain Score 6     Pain Location Back    Pain Orientation Right    Pain Descriptors / Indicators Aching;Sore;Numbness    Pain Type Chronic pain    Pain Onset More than a month ago                             [facility] Adult PT Treatment/Exercise - 09/23/20 0001      Exercises   Exercises Knee/Hip      Lumbar Exercises: Aerobic   Nustep Level 5 x 15 minutes.      Modalities    Modalities Electrical Stimulation;Moist Heat      Moist Heat Therapy   Number Minutes Moist Heat 20 Minutes    Moist Heat Location Lumbar Spine      Electrical Stimulation   Electrical Stimulation Location RT LB    Electrical Stimulation Action Pre-mod.    Electrical Stimulation Parameters 80-150 Hz x 20 minutes.    Electrical Stimulation Goals Tone;Pain      Manual Therapy   Manual Therapy Soft tissue mobilization    Soft tissue mobilization In left sdly position with pillow between knees for comfort:  STW/M x 8 minutes to patient's right low back musculature to decrease tone.                       PT Long Term Goals - 09/17/20 1023      PT LONG TERM GOAL #1   Title Independent with a HEP.    Time 8    Period Weeks    Status New      PT LONG TERM GOAL #2   Title  Increase right ankle dorsiflexion to 6- 8 degrees to normalize the patients gait pattern.    Time 8    Period Weeks    Status New      PT LONG TERM GOAL #3   Title Right LE strength 4+/5.    Time 8    Period Weeks    Status New      PT LONG TERM GOAL #4   Title Walk 250 feet with normal dorsiflexion.    Time 8    Period Weeks    Status New      PT LONG TERM GOAL #5   Title Perform a reciprocating stair gait with one railing.    Time 8    Period Weeks    Status New                 Plan - 09/23/20 0848    Clinical Impression Statement Excellent job today on Nustep with great steps/min.  Increase tone and palpable pain in right low back.  He responded well to STW/M.    Personal Factors and Comorbidities Comorbidity 1;Comorbidity 2    Comorbidities HTN, DM, 2 previous spinal surgeries.    Examination-Activity Limitations Locomotion Level;Other;Sleep    Examination-Participation Restrictions Other    Stability/Clinical Decision Making Stable/Uncomplicated    Rehab Potential Good    PT Duration 8 weeks    PT Treatment/Interventions ADLs/Self Care Home  Management;Cryotherapy;Electrical Stimulation;Gait training;Ultrasound;Moist Heat;Stair training;Functional mobility training;Therapeutic activities;Therapeutic exercise;Neuromuscular re-education;Patient/family education;Passive range of motion;Manual techniques    PT Next Visit Plan Progress per lumbar fusion protocol.  STW/M to right low back muscualture.    Consulted and Agree with Plan of Care Patient           Patient will benefit from skilled therapeutic intervention in order to improve the following deficits and impairments:     Visit Diagnosis: Muscle weakness (generalized)  Chronic right-sided low back pain without sciatica     Problem List Patient Active Problem List   Diagnosis Date Noted   Degenerative spondylolisthesis 06/27/2020   DJD (degenerative joint disease) 09/24/2019   Dizziness 09/23/2019   HTN (hypertension) 09/23/2019   HLD (hyperlipidemia) 09/23/2019   CAD (coronary artery disease) 09/23/2019   DM2 (diabetes mellitus, type 2) (HCC) 09/23/2019    Linnet Bottari, [place] MPT 09/23/2020, 9:26 AM  [facility] [address] [place], [place], [address] Phone: [phone]   Fax:  [phone]  Name: Tim Torres MRN: [id number] Date of Birth: [birth year]

## 2020-09-25 ENCOUNTER — Ambulatory Visit: Payer: Medicare HMO | Admitting: Physical Therapy

## 2020-09-25 ENCOUNTER — Other Ambulatory Visit: Payer: Self-pay

## 2020-09-25 DIAGNOSIS — M6281 Muscle weakness (generalized): Secondary | ICD-10-CM | POA: Diagnosis not present

## 2020-09-25 DIAGNOSIS — M545 Low back pain, unspecified: Secondary | ICD-10-CM

## 2020-09-25 DIAGNOSIS — G8929 Other chronic pain: Secondary | ICD-10-CM

## 2020-09-25 NOTE — Therapy (Signed)
[facility] [address] [place], [place], [address] Phone: [phone]   Fax:  [phone]  Physical Therapy Treatment  Patient Details  Name: Tim Torres MRN: [id number] Date of Birth: 07/15/1951 Referring Provider (PT): Julio Sicks MD   Encounter Date: 09/25/2020   PT End of Session - 09/25/20 0855    Visit Number 3    Number of Visits 16    Date for PT Re-Evaluation 12/17/20    Authorization Type FOTO AT LEAST EVERY 5TH VISIT.  PROGRESS NOTE AT 10TH VISIT.    PT Start Time 0815    PT Stop Time 0908    PT Time Calculation (min) 53 min    Activity Tolerance Patient tolerated treatment well    Behavior During Therapy [facility] for tasks assessed/performed           Past Medical History:  Diagnosis Date  . COVID-19 10/21/2019  . Diabetes mellitus without complication (HCC)   . Hypertension   . Myocardial infarction [facility]) 1997   per cardiology records, [place] 11/16/96, s/p PTCS distal CX  . Sleep apnea    per patient "does not wear CPAP, unable to tolerate"    Past Surgical History:  Procedure Laterality Date  . ANGIOPLASTY     per patient "back in 1997"  . BACK SURGERY      There were no vitals filed for this visit.   Subjective Assessment - 09/25/20 0825    Subjective COVID-19 screen performed prior to patient entering clinic.  The last treatment made me sore but helped.    Pertinent History HTN, DM, 3 previous spinal surgeries.    How long can you walk comfortably? Short community distances.    Patient Stated Goals Be outdoors.  Hunt.    Currently in Pain? Yes    Pain Location Back    Pain Orientation Right    Pain Descriptors / Indicators Aching;Sore;Numbness    Pain Type Chronic pain    Pain Onset More than a month ago                             [facility] Adult PT Treatment/Exercise - 09/25/20 0001      Exercises   Exercises Knee/Hip      Lumbar Exercises: Aerobic   Nustep Level 5 x 15  minutes.      Modalities   Modalities Electrical Stimulation;Moist Heat      Moist Heat Therapy   Number Minutes Moist Heat 20 Minutes    Moist Heat Location Lumbar Spine      Electrical Stimulation   Electrical Stimulation Location RT LB    Electrical Stimulation Action Pre-mod.    Electrical Stimulation Parameters 80-150 Hz x 20 minutes.    Electrical Stimulation Goals Tone;Pain      Manual Therapy   Manual Therapy Soft tissue mobilization    Soft tissue mobilization STW/M x 8 minutes to patient's right lumbar musculature.                       PT Long Term Goals - 09/17/20 1023      PT LONG TERM GOAL #1   Title Independent with a HEP.    Time 8    Period Weeks    Status New      PT LONG TERM GOAL #2   Title Increase right ankle dorsiflexion to 6- 8 degrees to normalize the patient's gait pattern.  Time 8    Period Weeks    Status New      PT LONG TERM GOAL #3   Title Right LE strength 4+/5.    Time 8    Period Weeks    Status New      PT LONG TERM GOAL #4   Title Walk 250 feet with normal dorsiflexion.    Time 8    Period Weeks    Status New      PT LONG TERM GOAL #5   Title Perform a reciprocating stair gait with one railing.    Time 8    Period Weeks    Status New                 Plan - 09/25/20 0856    Clinical Impression Statement The patient has responded well to treatment and especially STW/M.  He has c/o right hamstring cramping and an inability to lift his right foot while walking.  I provided him with information on an AFO.    Personal Factors and Comorbidities Comorbidity 1;Comorbidity 2    Comorbidities HTN, DM, 2 previous spinal surgeries.    Examination-Activity Limitations Locomotion Level;Other;Sleep    Examination-Participation Restrictions Other    Stability/Clinical Decision Making Stable/Uncomplicated    Rehab Potential Good    PT Duration 8 weeks    PT Treatment/Interventions ADLs/Self Care Home  Management;Cryotherapy;Electrical Stimulation;Gait training;Ultrasound;Moist Heat;Stair training;Functional mobility training;Therapeutic activities;Therapeutic exercise;Neuromuscular re-education;Patient/family education;Passive range of motion;Manual techniques    PT Next Visit Plan Progress per lumbar fusion protocol.  STW/M to right low back muscualture.    Consulted and Agree with Plan of Care Patient           Patient will benefit from skilled therapeutic intervention in order to improve the following deficits and impairments:  Pain, Decreased activity tolerance, Abnormal gait, Decreased strength, Increased muscle spasms  Visit Diagnosis: Muscle weakness (generalized)  Chronic right-sided low back pain without sciatica     Problem List Patient Active Problem List   Diagnosis Date Noted  . Degenerative spondylolisthesis 06/27/2020  . DJD (degenerative joint disease) 09/24/2019  . Dizziness 09/23/2019  . HTN (hypertension) 09/23/2019  . HLD (hyperlipidemia) 09/23/2019  . CAD (coronary artery disease) 09/23/2019  . DM2 (diabetes mellitus, type 2) (HCC) 09/23/2019    Bell Carbo, [place]  MPT 09/25/2020, 9:27 AM  [facility] [address] [place], [place], [address] Phone: [phone]   Fax:  [phone]  Name: Tim Torres MRN: [id number] Date of Birth: [birth year]

## 2020-09-30 ENCOUNTER — Other Ambulatory Visit: Payer: Self-pay

## 2020-09-30 ENCOUNTER — Ambulatory Visit: Payer: Medicare HMO | Admitting: Physical Therapy

## 2020-09-30 DIAGNOSIS — M6281 Muscle weakness (generalized): Secondary | ICD-10-CM | POA: Diagnosis not present

## 2020-09-30 DIAGNOSIS — M545 Low back pain, unspecified: Secondary | ICD-10-CM

## 2020-09-30 DIAGNOSIS — G8929 Other chronic pain: Secondary | ICD-10-CM

## 2020-09-30 NOTE — Therapy (Signed)
[facility] [address] [place], [place], [address] Phone: [phone]   Fax:  [phone]  Physical Therapy Treatment  Patient Details  Name: Tim Torres MRN: [id number] Date of Birth: 11-28-1950 Referring Provider (PT): Julio Sicks MD   Encounter Date: 09/30/2020   PT End of Session - 09/30/20 0857    Visit Number 4    Number of Visits 16    Date for PT Re-Evaluation 12/17/20    Authorization Type FOTO AT LEAST EVERY 5TH VISIT.  PROGRESS NOTE AT 10TH VISIT.    PT Start Time 0815    PT Stop Time 0907    PT Time Calculation (min) 52 min    Activity Tolerance Patient tolerated treatment well    Behavior During Therapy [facility] for tasks assessed/performed           Past Medical History:  Diagnosis Date   COVID-19 10/21/2019   Diabetes mellitus without complication (HCC)    Hypertension    Myocardial infarction [facility]) 1997   per cardiology records, [place] 11/16/96, s/p PTCS distal CX   Sleep apnea    per patient "does not wear CPAP, unable to tolerate"    Past Surgical History:  Procedure Laterality Date   ANGIOPLASTY     per patient "back in 1997"   BACK SURGERY      There were no vitals filed for this visit.   Subjective Assessment - 09/30/20 0851    Subjective COVID-19 screen performed prior to patient entering clinic.  Went walking in woods and legs tire out.    Pertinent History HTN, DM, 3 previous spinal surgeries.    How long can you walk comfortably? Short community distances.    Patient Stated Goals Be outdoors.  Hunt.    Currently in Pain? Yes    Pain Location Back    Pain Orientation Right    Pain Descriptors / Indicators Aching;Sore;Numbness    Pain Type Chronic pain    Pain Onset More than a month ago                             [facility] Adult PT Treatment/Exercise - 09/30/20 0001      Exercises   Exercises Knee/Hip      Lumbar Exercises: Aerobic   Nustep Level 5 x 15 minutes.       Modalities   Modalities Electrical Stimulation;Moist Heat      Moist Heat Therapy   Number Minutes Moist Heat 20 Minutes    Moist Heat Location Lumbar Spine      Electrical Stimulation   Electrical Stimulation Location RT LB    Electrical Stimulation Action Pre-mod.    Electrical Stimulation Parameters 80-150 Hz x 20 minutes.    Electrical Stimulation Goals Tone;Pain      Manual Therapy   Manual Therapy Soft tissue mobilization    Soft tissue mobilization STW/M x 8 minutes to patient's right low back to decrease pain and tone.                       PT Long Term Goals - 09/17/20 1023      PT LONG TERM GOAL #1   Title Independent with a HEP.    Time 8    Period Weeks    Status New      PT LONG TERM GOAL #2   Title Increase right ankle dorsiflexion to 6- 8 degrees to  normalize the patients gait pattern.    Time 8    Period Weeks    Status New      PT LONG TERM GOAL #3   Title Right LE strength 4+/5.    Time 8    Period Weeks    Status New      PT LONG TERM GOAL #4   Title Walk 250 feet with normal dorsiflexion.    Time 8    Period Weeks    Status New      PT LONG TERM GOAL #5   Title Perform a reciprocating stair gait with one railing.    Time 8    Period Weeks    Status New                 Plan - 09/30/20 0856    Clinical Impression Statement Patient with a lowered pain-level today.  He legs get tired as he walks longer.  He has responded well to STW/M to his right lumbar region.    Personal Factors and Comorbidities Comorbidity 1;Comorbidity 2    Comorbidities HTN, DM, 2 previous spinal surgeries.    Examination-Activity Limitations Locomotion Level;Other;Sleep    Examination-Participation Restrictions Other    Stability/Clinical Decision Making Stable/Uncomplicated    Rehab Potential Good    PT Duration 8 weeks    PT Treatment/Interventions ADLs/Self Care Home Management;Cryotherapy;Electrical Stimulation;Gait  training;Ultrasound;Moist Heat;Stair training;Functional mobility training;Therapeutic activities;Therapeutic exercise;Neuromuscular re-education;Patient/family education;Passive range of motion;Manual techniques    PT Next Visit Plan Progress per lumbar fusion protocol.  STW/M to right low back muscualture.    Consulted and Agree with Plan of Care Patient           Patient will benefit from skilled therapeutic intervention in order to improve the following deficits and impairments:  Pain, Decreased activity tolerance, Abnormal gait, Decreased strength, Increased muscle spasms  Visit Diagnosis: Muscle weakness (generalized)  Chronic right-sided low back pain without sciatica     Problem List Patient Active Problem List   Diagnosis Date Noted   Degenerative spondylolisthesis 06/27/2020   DJD (degenerative joint disease) 09/24/2019   Dizziness 09/23/2019   HTN (hypertension) 09/23/2019   HLD (hyperlipidemia) 09/23/2019   CAD (coronary artery disease) 09/23/2019   DM2 (diabetes mellitus, type 2) (HCC) 09/23/2019    Miguelangel Korn, [place] MPT 09/30/2020, 9:18 AM  [facility] [address] [place], [place], [address] Phone: [phone]   Fax:  [phone]  Name: Tim Torres MRN: [id number] Date of Birth: Jul 18, 1951

## 2020-10-02 ENCOUNTER — Ambulatory Visit: Payer: Medicare HMO | Admitting: *Deleted

## 2020-10-02 ENCOUNTER — Other Ambulatory Visit: Payer: Self-pay

## 2020-10-02 DIAGNOSIS — M6281 Muscle weakness (generalized): Secondary | ICD-10-CM

## 2020-10-02 DIAGNOSIS — M545 Low back pain, unspecified: Secondary | ICD-10-CM

## 2020-10-02 DIAGNOSIS — G8929 Other chronic pain: Secondary | ICD-10-CM

## 2020-10-02 NOTE — Therapy (Signed)
[facility] [address] [place], [place], [address] Phone: [phone]   Fax:  [phone]  Physical Therapy Treatment  Patient Details  Name: Tim Torres MRN: [id number] Date of Birth: 04-08-1951 Referring Provider (PT): Julio Sicks MD   Encounter Date: 10/02/2020   PT End of Session - 10/02/20 0813    Visit Number 5    Number of Visits 16    Date for PT Re-Evaluation 12/17/20    Authorization Type FOTO AT LEAST EVERY 5TH VISIT.  PROGRESS NOTE AT 10TH VISIT.    PT Start Time [phone]    PT Stop Time 0905    PT Time Calculation (min) 51 min           Past Medical History:  Diagnosis Date  . COVID-19 10/21/2019  . Diabetes mellitus without complication (HCC)   . Hypertension   . Myocardial infarction [facility]) 1997   per cardiology records, [place] 11/16/96, s/p PTCS distal CX  . Sleep apnea    per patient "does not wear CPAP, unable to tolerate"    Past Surgical History:  Procedure Laterality Date  . ANGIOPLASTY     per patient "back in 1997"  . BACK SURGERY      There were no vitals filed for this visit.                      [facility] Adult PT Treatment/Exercise - 10/02/20 0001      Exercises   Exercises Knee/Hip      Lumbar Exercises: Aerobic   Nustep Level 5-7 x 15 minutes.      Knee/Hip Exercises: Standing   Heel Raises Both;2 sets;10 reps    Heel Raises Limitations Toe raises 2x10 with RT foot on step    Rocker Board 5 minutes   DF/PF, ba;lance   Other Standing Knee Exercises seated  DF x 10      Modalities   Modalities Electrical Stimulation;Moist Heat      Moist Heat Therapy   Number Minutes Moist Heat 10 Minutes    Moist Heat Location --   RT HS     Electrical Stimulation   Electrical Stimulation Location RT TIB ANT.    Electrical Stimulation Action VMS    Electrical Stimulation Parameters 10 sec on/ Off    Electrical Stimulation Goals Tone;Pain      Manual Therapy   Manual Therapy  Soft tissue mobilization    Manual therapy comments Passive RT HS stretch    Soft tissue mobilization STW to RT HS and RT Ant. Tib                       PT Long Term Goals - 09/17/20 1023      PT LONG TERM GOAL #1   Title Independent with a HEP.    Time 8    Period Weeks    Status New      PT LONG TERM GOAL #2   Title Increase right ankle dorsiflexion to 6- 8 degrees to normalize the patient's gait pattern.    Time 8    Period Weeks    Status New      PT LONG TERM GOAL #3   Title Right LE strength 4+/5.    Time 8    Period Weeks    Status New      PT LONG TERM GOAL #4   Title Walk 250 feet with normal dorsiflexion.  Time 8    Period Weeks    Status New      PT LONG TERM GOAL #5   Title Perform a reciprocating stair gait with one railing.    Time 8    Period Weeks    Status New                 Plan - 10/02/20 0916    Clinical Impression Statement Pt reports minimal LB pain today and that his RT leg is his CC. Rx focused on RT LE strengthenining as well as proprioception and mm activation for RT Ant. tib.  VMS applied to Ant. tib with good activation response. today    Comorbidities HTN, DM, 2 previous spinal surgeries.    Examination-Activity Limitations Locomotion Level;Other;Sleep    Examination-Participation Restrictions Other    Stability/Clinical Decision Making Stable/Uncomplicated    Rehab Potential Good    PT Duration 8 weeks    PT Treatment/Interventions ADLs/Self Care Home Management;Cryotherapy;Electrical Stimulation;Gait training;Ultrasound;Moist Heat;Stair training;Functional mobility training;Therapeutic activities;Therapeutic exercise;Neuromuscular re-education;Patient/family education;Passive range of motion;Manual techniques    PT Next Visit Plan Progress per lumbar fusion protocol.  STW/M to right low back muscualture.    Consulted and Agree with Plan of Care Patient           Patient will benefit from skilled therapeutic  intervention in order to improve the following deficits and impairments:  Pain, Decreased activity tolerance, Abnormal gait, Decreased strength, Increased muscle spasms  Visit Diagnosis: Muscle weakness (generalized)  Chronic right-sided low back pain without sciatica     Problem List Patient Active Problem List   Diagnosis Date Noted  . Degenerative spondylolisthesis 06/27/2020  . DJD (degenerative joint disease) 09/24/2019  . Dizziness 09/23/2019  . HTN (hypertension) 09/23/2019  . HLD (hyperlipidemia) 09/23/2019  . CAD (coronary artery disease) 09/23/2019  . DM2 (diabetes mellitus, type 2) (HCC) 09/23/2019    Mckinnley Smithey,[name], PTA 10/02/2020, 9:24 AM  [facility] [address] [place], [place], [address] Phone: [phone]   Fax:  [phone]  Name: Tim Torres MRN: [id number] Date of Birth: 07/18/1951

## 2020-10-07 ENCOUNTER — Ambulatory Visit: Payer: Medicare HMO | Admitting: Physical Therapy

## 2020-10-07 ENCOUNTER — Other Ambulatory Visit: Payer: Self-pay

## 2020-10-07 DIAGNOSIS — M6281 Muscle weakness (generalized): Secondary | ICD-10-CM

## 2020-10-07 DIAGNOSIS — M545 Low back pain, unspecified: Secondary | ICD-10-CM

## 2020-10-07 NOTE — Therapy (Signed)
[facility] [address] [place], [place], [address] Phone: [phone]   Fax:  [phone]  Physical Therapy Treatment  Patient Details  Name: Tim Torres MRN: [id number] Date of Birth: 10/14/1951 Referring Provider (PT): Julio Sicks MD   Encounter Date: 10/07/2020   PT End of Session - 10/07/20 0941    Visit Number 6    Number of Visits 16    Date for PT Re-Evaluation 12/17/20    Authorization Type FOTO AT LEAST EVERY 5TH VISIT.  PROGRESS NOTE AT 10TH VISIT.    PT Start Time 0815    PT Stop Time 0907    PT Time Calculation (min) 52 min    Activity Tolerance Patient tolerated treatment well    Behavior During Therapy [facility] for tasks assessed/performed           Past Medical History:  Diagnosis Date  . COVID-19 10/21/2019  . Diabetes mellitus without complication (HCC)   . Hypertension   . Myocardial infarction [facility]) 1997   per cardiology records, [place] 11/16/96, s/p PTCS distal CX  . Sleep apnea    per patient "does not wear CPAP, unable to tolerate"    Past Surgical History:  Procedure Laterality Date  . ANGIOPLASTY     per patient "back in 1997"  . BACK SURGERY      There were no vitals filed for this visit.   Subjective Assessment - 10/07/20 0848    Subjective COVID-19 screen performed prior to patient entering clinic.  No new complaints.    Pertinent History HTN, DM, 3 previous spinal surgeries.    How long can you walk comfortably? Short community distances.    Patient Stated Goals Be outdoors.  Hunt.    Currently in Pain? Yes    Pain Score 6     Pain Location Back    Pain Orientation Right    Pain Descriptors / Indicators Aching;Sore;Numbness    Pain Type Chronic pain                             [facility] Adult PT Treatment/Exercise - 10/07/20 0001      Exercises   Exercises Knee/Hip;Ankle      Lumbar Exercises: Aerobic   Nustep Level 6 x 15 minutes.      Knee/Hip Exercises:  Machines for Strengthening   Cybex Knee Extension 20# x 2 minutes.    Cybex Knee Flexion 30# x 2 minutes.      Modalities   Modalities Electrical Stimulation;Moist Heat      Moist Heat Therapy   Number Minutes Moist Heat 20 Minutes    Moist Heat Location Lumbar Spine      Electrical Stimulation   Electrical Stimulation Location LB    Electrical Stimulation Action Pre-mod.    Electrical Stimulation Parameters 80-150 Hz x 20 minutes.    Electrical Stimulation Goals Tone;Pain      Ankle Exercises: Standing   Rocker Board Limitations --   5 minutes.                      PT Long Term Goals - 09/17/20 1023      PT LONG TERM GOAL #1   Title Independent with a HEP.    Time 8    Period Weeks    Status New      PT LONG TERM GOAL #2   Title Increase right ankle dorsiflexion to 6-  8 degrees to normalize the patient's gait pattern.    Time 8    Period Weeks    Status New      PT LONG TERM GOAL #3   Title Right LE strength 4+/5.    Time 8    Period Weeks    Status New      PT LONG TERM GOAL #4   Title Walk 250 feet with normal dorsiflexion.    Time 8    Period Weeks    Status New      PT LONG TERM GOAL #5   Title Perform a reciprocating stair gait with one railing.    Time 8    Period Weeks    Status New                 Plan - 10/07/20 0935    Clinical Impression Statement Patient did well today with weight machines.  He performed the reps very slow with excellent technique.    Personal Factors and Comorbidities Comorbidity 1;Comorbidity 2    Comorbidities HTN, DM, 2 previous spinal surgeries.    Examination-Activity Limitations Locomotion Level;Other;Sleep    Examination-Participation Restrictions Other    Stability/Clinical Decision Making Stable/Uncomplicated    Rehab Potential Good    PT Duration 8 weeks    PT Treatment/Interventions ADLs/Self Care Home Management;Cryotherapy;Electrical Stimulation;Gait training;Ultrasound;Moist Heat;Stair  training;Functional mobility training;Therapeutic activities;Therapeutic exercise;Neuromuscular re-education;Patient/family education;Passive range of motion;Manual techniques    PT Next Visit Plan Progress per lumbar fusion protocol.  STW/M to right low back muscualture.    Consulted and Agree with Plan of Care Patient           Patient will benefit from skilled therapeutic intervention in order to improve the following deficits and impairments:  Pain, Decreased activity tolerance, Abnormal gait, Decreased strength, Increased muscle spasms  Visit Diagnosis: Muscle weakness (generalized)  Chronic right-sided low back pain without sciatica     Problem List Patient Active Problem List   Diagnosis Date Noted  . Degenerative spondylolisthesis 06/27/2020  . DJD (degenerative joint disease) 09/24/2019  . Dizziness 09/23/2019  . HTN (hypertension) 09/23/2019  . HLD (hyperlipidemia) 09/23/2019  . CAD (coronary artery disease) 09/23/2019  . DM2 (diabetes mellitus, type 2) (HCC) 09/23/2019    Joann Kulpa, [place] MPT 10/07/2020, 9:43 AM  [facility] [address] [place], [place], [address] Phone: [phone]   Fax:  [phone]  Name: Tim Torres MRN: [id number] Date of Birth: 1951/03/09

## 2020-10-14 ENCOUNTER — Other Ambulatory Visit: Payer: Self-pay

## 2020-10-14 ENCOUNTER — Ambulatory Visit: Payer: Medicare HMO | Admitting: Physical Therapy

## 2020-10-14 DIAGNOSIS — M6281 Muscle weakness (generalized): Secondary | ICD-10-CM | POA: Diagnosis not present

## 2020-10-14 DIAGNOSIS — M545 Low back pain, unspecified: Secondary | ICD-10-CM

## 2020-10-14 NOTE — Therapy (Signed)
[facility] [address] [place], [place], [address] Phone: [phone]   Fax:  [phone]  Physical Therapy Treatment  Patient Details  Name: Tim Torres MRN: [id number] Date of Birth: [birth year] Referring Provider (PT): Julio Sicks MD   Encounter Date: 10/14/2020   PT End of Session - 10/14/20 0854    Visit Number 7    Number of Visits 16    Date for PT Re-Evaluation 12/17/20    Authorization Type FOTO AT LEAST EVERY 5TH VISIT.  PROGRESS NOTE AT 10TH VISIT.    PT Start Time 0817    PT Stop Time 0909    PT Time Calculation (min) 52 min    Activity Tolerance Patient tolerated treatment well    Behavior During Therapy [facility] for tasks assessed/performed           Past Medical History:  Diagnosis Date  . COVID-19 10/21/2019  . Diabetes mellitus without complication (HCC)   . Hypertension   . Myocardial infarction [facility]) 1997   per cardiology records, [place] 11/16/96, s/p PTCS distal CX  . Sleep apnea    per patient "does not wear CPAP, unable to tolerate"    Past Surgical History:  Procedure Laterality Date  . ANGIOPLASTY     per patient "back in 1997"  . BACK SURGERY      There were no vitals filed for this visit.   Subjective Assessment - 10/14/20 0851    Subjective COVID-19 screen performed prior to patient entering clinic.  Doing okay.    Pertinent History HTN, DM, 3 previous spinal surgeries.    How long can you walk comfortably? Short community distances.    Pain Onset More than a month ago                             [facility] Adult PT Treatment/Exercise - 10/14/20 0001      Exercises   Exercises Knee/Hip;Ankle      Lumbar Exercises: Aerobic   Nustep Level 6 x 15 minutes.      Knee/Hip Exercises: Machines for Strengthening   Cybex Knee Extension 10# x 2 minutes.    Cybex Knee Flexion 30# x 2 minutes.      Modalities   Modalities Electrical Stimulation;Moist Heat      Moist Heat Therapy    Number Minutes Moist Heat 20 Minutes    Moist Heat Location Lumbar Spine      Electrical Stimulation   Electrical Stimulation Location RT LB    Electrical Stimulation Action Pre-mod.    Electrical Stimulation Parameters 80-150 Hz x 20 minutes.    Electrical Stimulation Goals Tone;Pain      Ankle Exercises: Standing   Other Standing Ankle Exercises Rockerboard in parallel bars x 4 minutes.                       PT Long Term Goals - 09/17/20 1023      PT LONG TERM GOAL #1   Title Independent with a HEP.    Time 8    Period Weeks    Status New      PT LONG TERM GOAL #2   Title Increase right ankle dorsiflexion to 6- 8 degrees to normalize the patient's gait pattern.    Time 8    Period Weeks    Status New      PT LONG TERM GOAL #3   Title Right  LE strength 4+/5.    Time 8    Period Weeks    Status New      PT LONG TERM GOAL #4   Title Walk 250 feet with normal dorsiflexion.    Time 8    Period Weeks    Status New      PT LONG TERM GOAL #5   Title Perform a reciprocating stair gait with one railing.    Time 8    Period Weeks    Status New                 Plan - 10/14/20 0855    Clinical Impression Statement Patient did very well today.  Able to perform continuous ham curls x 2 minutes without cramping.    Personal Factors and Comorbidities Comorbidity 1;Comorbidity 2    Comorbidities HTN, DM, 2 previous spinal surgeries.    Examination-Activity Limitations Locomotion Level;Other;Sleep    Stability/Clinical Decision Making Stable/Uncomplicated    Rehab Potential Good    PT Duration 8 weeks    PT Treatment/Interventions ADLs/Self Care Home Management;Cryotherapy;Electrical Stimulation;Gait training;Ultrasound;Moist Heat;Stair training;Functional mobility training;Therapeutic activities;Therapeutic exercise;Neuromuscular re-education;Patient/family education;Passive range of motion;Manual techniques    PT Next Visit Plan Progress per lumbar  fusion protocol.  STW/M to right low back muscualture.    Consulted and Agree with Plan of Care Patient           Patient will benefit from skilled therapeutic intervention in order to improve the following deficits and impairments:  Pain, Decreased activity tolerance, Abnormal gait, Decreased strength, Increased muscle spasms  Visit Diagnosis: Muscle weakness (generalized)  Chronic right-sided low back pain without sciatica     Problem List Patient Active Problem List   Diagnosis Date Noted  . Degenerative spondylolisthesis 06/27/2020  . DJD (degenerative joint disease) 09/24/2019  . Dizziness 09/23/2019  . HTN (hypertension) 09/23/2019  . HLD (hyperlipidemia) 09/23/2019  . CAD (coronary artery disease) 09/23/2019  . DM2 (diabetes mellitus, type 2) (HCC) 09/23/2019    Stacy Sailer, [place] MPT 10/14/2020, 9:09 AM  [facility] [address] [place], [place], [address] Phone: [phone]   Fax:  [phone]  Name: Tim Torres MRN: [id number] Date of Birth: [birth year]

## 2020-10-16 ENCOUNTER — Ambulatory Visit: Payer: Medicare HMO | Attending: Neurosurgery | Admitting: *Deleted

## 2020-10-16 ENCOUNTER — Encounter: Payer: Self-pay | Admitting: *Deleted

## 2020-10-16 ENCOUNTER — Other Ambulatory Visit: Payer: Self-pay

## 2020-10-16 DIAGNOSIS — M545 Low back pain, unspecified: Secondary | ICD-10-CM | POA: Diagnosis present

## 2020-10-16 DIAGNOSIS — G8929 Other chronic pain: Secondary | ICD-10-CM | POA: Insufficient documentation

## 2020-10-16 DIAGNOSIS — M6281 Muscle weakness (generalized): Secondary | ICD-10-CM | POA: Diagnosis present

## 2020-10-16 NOTE — Therapy (Signed)
[facility] [address] [place], [place], [address] Phone: [phone]   Fax:  [phone]  Physical Therapy Treatment  Patient Details  Name: Tim Torres MRN: [id number] Date of Birth: Oct 26, 1951 Referring Provider (PT): Julio Sicks MD   Encounter Date: 10/16/2020   PT End of Session - 10/16/20 0835    Visit Number 8    Number of Visits 16    Authorization Type FOTO AT LEAST EVERY 5TH VISIT.  PROGRESS NOTE AT 10TH VISIT.    PT Start Time 0815    PT Stop Time 0905    PT Time Calculation (min) 50 min           Past Medical History:  Diagnosis Date  . COVID-19 10/21/2019  . Diabetes mellitus without complication (HCC)   . Hypertension   . Myocardial infarction [facility]) 1997   per cardiology records, [place] 11/16/96, s/p PTCS distal CX  . Sleep apnea    per patient "does not wear CPAP, unable to tolerate"    Past Surgical History:  Procedure Laterality Date  . ANGIOPLASTY     per patient "back in 1997"  . BACK SURGERY      There were no vitals filed for this visit.   Subjective Assessment - 10/16/20 0831    Subjective COVID-19 screen performed prior to patient entering clinic.  Doing okay.RT foot control is getting better, but still weak    Pertinent History HTN, DM, 3 previous spinal surgeries.    How long can you walk comfortably? Short community distances.    Patient Stated Goals Be outdoors.  Hunt.    Currently in Pain? Yes    Pain Score 4     Pain Location Back    Pain Orientation Right    Pain Descriptors / Indicators Aching;Sore    Pain Type Chronic pain    Pain Onset More than a month ago                             [facility] Adult PT Treatment/Exercise - 10/16/20 0001      Exercises   Exercises Knee/Hip;Ankle      Lumbar Exercises: Aerobic   Nustep Level 6 x 15 minutes.      Knee/Hip Exercises: Stretches   Active Hamstring Stretch Right    Active Hamstring Stretch Limitations with  ankle pump nerve glides      Knee/Hip Exercises: Standing   Rocker Board 5 minutes    Other Standing Knee Exercises seated  DF x 10    Other Standing Knee Exercises standing dyna disc all motions RT foot      Modalities   Modalities Electrical Stimulation;Moist Heat      Moist Heat Therapy   Number Minutes Moist Heat 15 Minutes    Moist Heat Location Lumbar Spine      Electrical Stimulation   Electrical Stimulation Location RT LB and RT ant/tib    Electrical Stimulation Action premod/VMS    Electrical Stimulation Parameters 80-150hz  x 15 mins/ 10 secs on/off    Electrical Stimulation Goals Tone;Pain      Manual Therapy   Manual Therapy Soft tissue mobilization    Soft tissue mobilization STW to  RT Ant. Tib                       PT Long Term Goals - 09/17/20 1023      PT LONG TERM GOAL #  1   Title Independent with a HEP.    Time 8    Period Weeks    Status New      PT LONG TERM GOAL #2   Title Increase right ankle dorsiflexion to 6- 8 degrees to normalize the patient's gait pattern.    Time 8    Period Weeks    Status New      PT LONG TERM GOAL #3   Title Right LE strength 4+/5.    Time 8    Period Weeks    Status New      PT LONG TERM GOAL #4   Title Walk 250 feet with normal dorsiflexion.    Time 8    Period Weeks    Status New      PT LONG TERM GOAL #5   Title Perform a reciprocating stair gait with one railing.    Time 8    Period Weeks    Status New                 Plan - 10/16/20 1319    Clinical Impression Statement Pt arrived doing fair with reports of some LBP and still with RT LE weakness. Rx focused on core activation as well as RT LE proprioception and mm activation. Normal modality responsetoday. Pt feels he is progressing, but slowly with RT foot strengthening    Personal Factors and Comorbidities Comorbidity 1;Comorbidity 2    Comorbidities HTN, DM, 2 previous spinal surgeries.    Rehab Potential Good    PT Duration 8  weeks    PT Treatment/Interventions ADLs/Self Care Home Management;Cryotherapy;Electrical Stimulation;Gait training;Ultrasound;Moist Heat;Stair training;Functional mobility training;Therapeutic activities;Therapeutic exercise;Neuromuscular re-education;Patient/family education;Passive range of motion;Manual techniques    PT Next Visit Plan Progress per lumbar fusion protocol.  STW/M to right low back muscualture.           Patient will benefit from skilled therapeutic intervention in order to improve the following deficits and impairments:  Pain, Decreased activity tolerance, Abnormal gait, Decreased strength, Increased muscle spasms  Visit Diagnosis: Muscle weakness (generalized)  Chronic right-sided low back pain without sciatica     Problem List Patient Active Problem List   Diagnosis Date Noted  . Degenerative spondylolisthesis 06/27/2020  . DJD (degenerative joint disease) 09/24/2019  . Dizziness 09/23/2019  . HTN (hypertension) 09/23/2019  . HLD (hyperlipidemia) 09/23/2019  . CAD (coronary artery disease) 09/23/2019  . DM2 (diabetes mellitus, type 2) (HCC) 09/23/2019    Zaveon Gillen,[name], PTA 10/16/2020, 1:25 PM  Tim Torres [address] [place], [place], [address] Phone: [phone]   Fax:  [phone]  Name: [name] MRN: [id number] Date of Birth: 09-Oct-1951

## 2020-10-20 ENCOUNTER — Encounter: Payer: Self-pay | Admitting: Physical Therapy

## 2020-10-20 ENCOUNTER — Other Ambulatory Visit: Payer: Self-pay

## 2020-10-20 ENCOUNTER — Ambulatory Visit: Payer: Medicare HMO | Admitting: Physical Therapy

## 2020-10-20 DIAGNOSIS — M6281 Muscle weakness (generalized): Secondary | ICD-10-CM

## 2020-10-20 DIAGNOSIS — G8929 Other chronic pain: Secondary | ICD-10-CM

## 2020-10-20 DIAGNOSIS — M545 Low back pain, unspecified: Secondary | ICD-10-CM

## 2020-10-20 NOTE — Therapy (Signed)
[facility] [address] [place], [place], [address] Phone: [phone]   Fax:  [phone]  Physical Therapy Treatment  Patient Details  Name: Tim Torres MRN: [id number] Date of Birth: Jan 21, 1951 Referring Provider (PT): Julio Sicks MD   Encounter Date: 10/20/2020   PT End of Session - 10/20/20 0853    Visit Number 9    Number of Visits 16    Date for PT Re-Evaluation 12/17/20    Authorization Type FOTO AT LEAST EVERY 5TH VISIT.  PROGRESS NOTE AT 10TH VISIT.    PT Start Time [phone]    PT Stop Time 0901    PT Time Calculation (min) 44 min    Activity Tolerance Patient tolerated treatment well    Behavior During Therapy [facility] for tasks assessed/performed           Past Medical History:  Diagnosis Date  . COVID-19 10/21/2019  . Diabetes mellitus without complication (HCC)   . Hypertension   . Myocardial infarction [facility]) 1997   per cardiology records, [place] 11/16/96, s/p PTCS distal CX  . Sleep apnea    per patient "does not wear CPAP, unable to tolerate"    Past Surgical History:  Procedure Laterality Date  . ANGIOPLASTY     per patient "back in 1997"  . BACK SURGERY      There were no vitals filed for this visit.   Subjective Assessment - 10/20/20 0819    Subjective COVID-19 screen performed prior to patient entering clinic. Reports increased R ankle pain since last treatment where he has been limited with walking. Felt like his ankle was sprained after doing circles.    Pertinent History HTN, DM, 3 previous spinal surgeries.    How long can you walk comfortably? Short community distances.    Patient Stated Goals Be outdoors.  Hunt.    Currently in Pain? Yes    Pain Score 5     Pain Location Leg    Pain Orientation Right;Distal    Pain Descriptors / Indicators Discomfort    Pain Type Chronic pain    Pain Onset More than a month ago    Pain Frequency Constant                             [facility]  Adult PT Treatment/Exercise - 10/20/20 0001      Exercises   Exercises Lumbar;Ankle      Lumbar Exercises: Aerobic   Nustep Level 6 x 15 minutes.      Lumbar Exercises: Supine   Bridge 20 reps;3 seconds    Other Supine Lumbar Exercises R sciatic nerve glide x1 rep    stopped due to nausea related to pain     Modalities   Modalities Electrical Stimulation;Moist Heat      Moist Heat Therapy   Number Minutes Moist Heat 10 Minutes    Moist Heat Location Lumbar Spine      Electrical Stimulation   Electrical Stimulation Location R low back/buttock    Electrical Stimulation Action Pre-Mod    Electrical Stimulation Parameters 80-150 hz x10 min    Electrical Stimulation Goals Tone;Pain      Ankle Exercises: Standing   Rocker Board 5 minutes   DF/PF only   Other Standing Ankle Exercises R forward step ups 8" step x20 reps   emphasis on R ankle Df  PT Long Term Goals - 10/20/20 5597      PT LONG TERM GOAL #1   Title Independent with a HEP.    Time 8    Period Weeks    Status On-going      PT LONG TERM GOAL #2   Title Increase right ankle dorsiflexion to 6- 8 degrees to normalize the patient's gait pattern.    Time 8    Period Weeks    Status On-going      PT LONG TERM GOAL #3   Title Right LE strength 4+/5.    Time 8    Period Weeks    Status On-going      PT LONG TERM GOAL #4   Title Walk 250 feet with normal dorsiflexion.    Time 8    Period Weeks    Status On-going      PT LONG TERM GOAL #5   Title Perform a reciprocating stair gait with one railing.    Time 8    Period Weeks    Status On-going                 Plan - 10/20/20 0955    Clinical Impression Statement Patient presented in clinic with reports of increased R ankle discomfort since ankle ROM activity last week. Patient reports that R ankle felt sprained all weekend. Patient's AROM ankle DF limited still and could feel tension in sitting in RLE. Patient was  guided through attempts at sciatic glides but limited by pain and nausea. Normal modalities response noted following removal of the modalities.    Personal Factors and Comorbidities Comorbidity 1;Comorbidity 2    Comorbidities HTN, DM, 2 previous spinal surgeries.    Examination-Activity Limitations Locomotion Level;Other;Sleep    Examination-Participation Restrictions Other    Stability/Clinical Decision Making Stable/Uncomplicated    Rehab Potential Good    PT Duration 8 weeks    PT Treatment/Interventions ADLs/Self Care Home Management;Cryotherapy;Electrical Stimulation;Gait training;Ultrasound;Moist Heat;Stair training;Functional mobility training;Therapeutic activities;Therapeutic exercise;Neuromuscular re-education;Patient/family education;Passive range of motion;Manual techniques    PT Next Visit Plan Progress per lumbar fusion protocol.  STW/M to right low back muscualture.    Consulted and Agree with Plan of Care Patient           Patient will benefit from skilled therapeutic intervention in order to improve the following deficits and impairments:  Pain, Decreased activity tolerance, Abnormal gait, Decreased strength, Increased muscle spasms  Visit Diagnosis: Muscle weakness (generalized)  Chronic right-sided low back pain without sciatica     Problem List Patient Active Problem List   Diagnosis Date Noted  . Degenerative spondylolisthesis 06/27/2020  . DJD (degenerative joint disease) 09/24/2019  . Dizziness 09/23/2019  . HTN (hypertension) 09/23/2019  . HLD (hyperlipidemia) 09/23/2019  . CAD (coronary artery disease) 09/23/2019  . DM2 (diabetes mellitus, type 2) (HCC) 09/23/2019    Marvell Fuller, PTA 10/20/2020, 9:58 AM  [facility] [address] [place], [place], [address] Phone: [phone]   Fax:  [phone]  Name: Tim Torres MRN: [id number] Date of Birth: Apr 06, 1951

## 2020-10-22 ENCOUNTER — Other Ambulatory Visit: Payer: Self-pay

## 2020-10-22 ENCOUNTER — Ambulatory Visit: Payer: Medicare HMO | Admitting: Physical Therapy

## 2020-10-22 ENCOUNTER — Encounter: Payer: Self-pay | Admitting: Physical Therapy

## 2020-10-22 DIAGNOSIS — M6281 Muscle weakness (generalized): Secondary | ICD-10-CM | POA: Diagnosis not present

## 2020-10-22 DIAGNOSIS — G8929 Other chronic pain: Secondary | ICD-10-CM

## 2020-10-22 DIAGNOSIS — M545 Low back pain, unspecified: Secondary | ICD-10-CM

## 2020-10-22 NOTE — Therapy (Signed)
[facility] [place], [place], [address] Phone: [phone]   Fax:  [phone]  Physical Therapy Treatment  Patient Details  Name: Tim Torres MRN: [id number] Date of Birth: [birth year] Referring Provider (PT): Earnie Larsson MD   Encounter Date: 10/22/2020   PT End of Session - 10/22/20 0823    Visit Number 10    Number of Visits 16    Date for PT Re-Evaluation 12/17/20    Authorization Type FOTO AT LEAST EVERY 5TH VISIT.  PROGRESS NOTE AT 10TH VISIT.    PT Start Time 0817    PT Stop Time 0906    PT Time Calculation (min) 49 min    Activity Tolerance Patient tolerated treatment well    Behavior During Therapy [facility] for tasks assessed/performed           Past Medical History:  Diagnosis Date  . COVID-19 10/21/2019  . Diabetes mellitus without complication ([place])   . Hypertension   . Myocardial infarction [facility]) 1997   per cardiology records, [place] 11/16/96, s/p PTCS distal CX  . Sleep apnea    per patient "does not wear CPAP, unable to tolerate"    Past Surgical History:  Procedure Laterality Date  . ANGIOPLASTY     per patient "back in 1997"  . BACK SURGERY      There were no vitals filed for this visit.   Subjective Assessment - 10/22/20 0822    Subjective COVID-19 screen performed prior to patient entering clinic. Reports more lumbar soreness and continued R ankle pain.    Pertinent History HTN, DM, 3 previous spinal surgeries.    How long can you walk comfortably? Short community distances.    Patient Stated Goals Be outdoors.  Hunt.    Currently in Pain? Yes    Pain Score 4     Pain Location Back    Pain Orientation Right;Left;Lower    Pain Descriptors / Indicators Sore    Pain Type Chronic pain    Pain Onset More than a month ago    Pain Frequency Constant    Multiple Pain Sites Yes    Pain Score 2    Pain Location Ankle    Pain Orientation Right    Pain Descriptors / Indicators Discomfort     Pain Type Chronic pain    Pain Onset More than a month ago    Pain Frequency Constant              [facility] PT Assessment - 10/22/20 0001      Assessment   Medical Diagnosis Spondylolisthesis    Referring Provider (PT) Earnie Larsson MD    Onset Date/Surgical Date 06/27/20    Next MD Visit 10/23/2020      Precautions   Precaution Comments Follow lumbar fusion protocol.      Restrictions   Weight Bearing Restrictions No      ROM / Strength   AROM / PROM / Strength AROM;Strength      AROM   Overall AROM  Deficits    AROM Assessment Site Ankle    Right/Left Ankle Right    Right Ankle Dorsiflexion 0   Rests at -25-15 (supine)     Strength   Overall Strength Deficits    Strength Assessment Site Hip    Right/Left Hip Right    Right Hip Flexion 4/5    Right Hip External Rotation  4+/5    Right Hip ABduction 4+/5    Right/Left Knee  Right    Right Knee Flexion 4+/5    Right Knee Extension 4+/5    Right/Left Ankle Right    Right Ankle Dorsiflexion 3/5                         [facility] Adult PT Treatment/Exercise - 10/22/20 0001      Lumbar Exercises: Aerobic   Nustep Level 6 x 15 minutes.      Lumbar Exercises: Machines for Strengthening   Cybex Knee Extension 10# 3x10 reps    Cybex Knee Flexion 30# 3x10 reps    Leg Press 1.5 pl, seat 9 x30 reps      Lumbar Exercises: Supine   Bridge 20 reps;5 seconds      Knee/Hip Exercises: Standing   Rocker Board 5 minutes      Modalities   Modalities Teacher, English as a foreign language Location B lumbar paraspinals    Electrical Stimulation Action Pre-Mod    Electrical Stimulation Parameters 80-150 hz x10 min    Electrical Stimulation Goals Tone;Pain                       PT Long Term Goals - 10/22/20 0847      PT LONG TERM GOAL #1   Title Independent with a HEP.    Time 8    Period Weeks    Status On-going      PT LONG TERM GOAL #2   Title Increase  right ankle dorsiflexion to 6- 8 degrees to normalize the patient's gait pattern.    Time 8    Period Weeks    Status Not Met      PT LONG TERM GOAL #3   Title Right LE strength 4+/5.    Time 8    Period Weeks    Status On-going      PT LONG TERM GOAL #4   Title Walk 250 feet with normal dorsiflexion.    Time 8    Period Weeks    Status On-going      PT LONG TERM GOAL #5   Title Perform a reciprocating stair gait with one railing.    Time 8    Period Weeks    Status Partially Met   Descending more difficult                Plan - 10/22/20 0935    Clinical Impression Statement Patient presented in clinic with low back soreness as well as continued R ankle pain. Patient still frustrated regarding lack of R ankle DF actively. Patient also reported LE weakness upon arrival. Limited goal progression due to weakness of LE and limited R ankle DF. Able to tolerate more LE machine strengthening well with only reports of cramping in HS during resisted knee flexion. Increased tone of R QL/lumbar paraspinals at upper lumbar levels are palpable and some edema observed. 54% limitation, CK status for FOTO survey today. Normal stimulation response noted following removal of the modality.    Personal Factors and Comorbidities Comorbidity 1;Comorbidity 2    Comorbidities HTN, DM, 2 previous spinal surgeries.    Examination-Activity Limitations Locomotion Level;Other;Sleep    Examination-Participation Restrictions Other    Stability/Clinical Decision Making Stable/Uncomplicated    Rehab Potential Good    PT Duration 8 weeks    PT Treatment/Interventions ADLs/Self Care Home Management;Cryotherapy;Electrical Stimulation;Gait training;Ultrasound;Moist Heat;Stair training;Functional mobility training;Therapeutic activities;Therapeutic exercise;Neuromuscular re-education;Patient/family education;Passive range of  motion;Manual techniques    PT Next Visit Plan Progress per lumbar fusion protocol.   STW/M to right low back muscualture.    Consulted and Agree with Plan of Care Patient           Patient will benefit from skilled therapeutic intervention in order to improve the following deficits and impairments:  Pain, Decreased activity tolerance, Abnormal gait, Decreased strength, Increased muscle spasms  Visit Diagnosis: Muscle weakness (generalized)  Chronic right-sided low back pain without sciatica     Problem List Patient Active Problem List   Diagnosis Date Noted  . Degenerative spondylolisthesis 06/27/2020  . DJD (degenerative joint disease) 09/24/2019  . Dizziness 09/23/2019  . HTN (hypertension) 09/23/2019  . HLD (hyperlipidemia) 09/23/2019  . CAD (coronary artery disease) 09/23/2019  . DM2 (diabetes mellitus, type 2) ([place]) 09/23/2019   Standley Brooking, PTA 10/22/20 9:46 AM   [facility] [address] [place], [place], [address] Phone: [phone]   Fax:  [phone]  Name: Tim Torres MRN: [id number] Date of Birth: 1951-08-01  Progress Note Reporting Period 09/17/20 to 10/22/20  See note below for Objective Data and Assessment of Progress/Goals. Goals ongoing with goal #5 partially met.    Mali Applegate MPT

## 2020-10-28 ENCOUNTER — Ambulatory Visit: Payer: Medicare HMO | Admitting: Physical Therapy

## 2020-10-28 ENCOUNTER — Other Ambulatory Visit: Payer: Self-pay

## 2020-10-28 ENCOUNTER — Encounter: Payer: Self-pay | Admitting: Physical Therapy

## 2020-10-28 DIAGNOSIS — M6281 Muscle weakness (generalized): Secondary | ICD-10-CM

## 2020-10-28 DIAGNOSIS — M545 Low back pain, unspecified: Secondary | ICD-10-CM

## 2020-10-28 NOTE — Therapy (Addendum)
[facility] [place], [place], [address] Phone: [phone]   Fax:  [phone]  Physical Therapy Treatment PHYSICAL THERAPY DISCHARGE SUMMARY  Visits from Start of Care: 11  Current functional level related to goals / functional outcomes: See below   Remaining deficits: See goals   Education / Equipment: HEP Plan: Patient agrees to discharge.  Patient goals were partially met. Patient is being discharged due to not returning since the last visit.  ?????     Patient Details  Name: Tim Torres MRN: [id number] Date of Birth: June 02, 1951 Referring Provider (PT): Earnie Larsson MD   Encounter Date: 10/28/2020   PT End of Session - 10/28/20 0821    Visit Number 11    Number of Visits 16    Date for PT Re-Evaluation 12/17/20    Authorization Type FOTO AT LEAST EVERY 5TH VISIT.  PROGRESS NOTE AT 10TH VISIT.    PT Start Time 0815    PT Stop Time 0905    PT Time Calculation (min) 50 min    Activity Tolerance Patient tolerated treatment well    Behavior During Therapy [facility] for tasks assessed/performed           Past Medical History:  Diagnosis Date  . COVID-19 10/21/2019  . Diabetes mellitus without complication ([place])   . Hypertension   . Myocardial infarction [facility]) 1997   per cardiology records, [place] 11/16/96, s/p PTCS distal CX  . Sleep apnea    per patient "does not wear CPAP, unable to tolerate"    Past Surgical History:  Procedure Laterality Date  . ANGIOPLASTY     per patient "back in 1997"  . BACK SURGERY      There were no vitals filed for this visit.   Subjective Assessment - 10/28/20 0821    Subjective COVID-19 screen performed prior to patient entering clinic. Reports always having LBP, 4/10 today. Still with R foot/ankle pain especially at the end of the day after being up on it.    Pertinent History HTN, DM, 3 previous spinal surgeries.    How long can you walk comfortably? Short community  distances.    Patient Stated Goals Be outdoors.  Hunt.    Currently in Pain? Yes    Pain Score 4     Pain Location Back    Pain Orientation Right;Lower;Left    Pain Descriptors / Indicators Sore    Pain Type Chronic pain    Pain Onset More than a month ago    Pain Frequency Constant    Pain Score 4    Pain Location Ankle    Pain Orientation Right    Pain Descriptors / Indicators Discomfort;Constant    Pain Type Chronic pain    Pain Onset More than a month ago    Pain Frequency Constant              [facility] PT Assessment - 10/28/20 0001      Assessment   Medical Diagnosis Spondylolisthesis    Referring Provider (PT) Earnie Larsson MD    Onset Date/Surgical Date 06/27/20    Next MD Visit 10/23/2020      Precautions   Precaution Comments Follow lumbar fusion protocol.      Restrictions   Weight Bearing Restrictions No                         [facility] Adult PT Treatment/Exercise - 10/28/20 0001  Lumbar Exercises: Aerobic   Nustep Level 6 x 15 minutes; LEs only      Lumbar Exercises: Machines for Strengthening   Cybex Knee Extension 10# 3x10 reps    Cybex Knee Flexion 30# 3x10 reps    Leg Press 1.5 pl, seat 9 x30 reps      Lumbar Exercises: Standing   Other Standing Lumbar Exercises chop/lift with draw in, x20 orange XTS      Lumbar Exercises: Supine   Bridge 20 reps;5 seconds    Bridge with [employer] --      Knee/Hip Exercises: Land 3 minutes      Modalities   Modalities Electrical Stimulation      Moist Heat Therapy   Number Minutes Moist Heat 10 Minutes    Moist Heat Location Lumbar Spine      Electrical Stimulation   Electrical Stimulation Location B lumbar paraspinals    Electrical Stimulation Action pre-mod    Electrical Stimulation Parameters 80-150 hz x10 mins    Electrical Stimulation Goals Tone;Pain                       PT Long Term Goals - 10/22/20 0847      PT LONG TERM GOAL #1   Title  Independent with a HEP.    Time 8    Period Weeks    Status On-going      PT LONG TERM GOAL #2   Title Increase right ankle dorsiflexion to 6- 8 degrees to normalize the patient's gait pattern.    Time 8    Period Weeks    Status Not Met      PT LONG TERM GOAL #3   Title Right LE strength 4+/5.    Time 8    Period Weeks    Status On-going      PT LONG TERM GOAL #4   Title Walk 250 feet with normal dorsiflexion.    Time 8    Period Weeks    Status On-going      PT LONG TERM GOAL #5   Title Perform a reciprocating stair gait with one railing.    Time 8    Period Weeks    Status Partially Met   Descending more difficult                Plan - 10/28/20 0833    Clinical Impression Statement Patient responded fairly well to therapy session with ongoing R ankle and low back pain. Patient guided though TEs and requried verbal cuing to decrease pace of exercise. Patient and PT discussed justification for core stability and strengthening for lumbar stability. No adverse affects upon removal of modalities.    Personal Factors and Comorbidities Comorbidity 1;Comorbidity 2    Comorbidities HTN, DM, 2 previous spinal surgeries.    Examination-Activity Limitations Locomotion Level;Other;Sleep    Examination-Participation Restrictions Other    Stability/Clinical Decision Making Stable/Uncomplicated    Clinical Decision Making Low    Rehab Potential Good    PT Duration 8 weeks    PT Treatment/Interventions ADLs/Self Care Home Management;Cryotherapy;Electrical Stimulation;Gait training;Ultrasound;Moist Heat;Stair training;Functional mobility training;Therapeutic activities;Therapeutic exercise;Neuromuscular re-education;Patient/family education;Passive range of motion;Manual techniques    PT Next Visit Plan Progress per lumbar fusion protocol.  STW/M to right low back muscualture.    Consulted and Agree with Plan of Care Patient           Patient will benefit from skilled  therapeutic intervention in  order to improve the following deficits and impairments:  Pain,Decreased activity tolerance,Abnormal gait,Decreased strength,Increased muscle spasms  Visit Diagnosis: Muscle weakness (generalized)  Chronic right-sided low back pain without sciatica     Problem List Patient Active Problem List   Diagnosis Date Noted  . Degenerative spondylolisthesis 06/27/2020  . DJD (degenerative joint disease) 09/24/2019  . Dizziness 09/23/2019  . HTN (hypertension) 09/23/2019  . HLD (hyperlipidemia) 09/23/2019  . CAD (coronary artery disease) 09/23/2019  . DM2 (diabetes mellitus, type 2) ([place]) 09/23/2019    Gabriela Eves, PT, DPT 10/28/2020, 10:14 AM  [facility] [address] [place], [place], [address] Phone: [phone]   Fax:  [phone]  Name: Tim Torres MRN: [id number] Date of Birth: [birth year]

## 2020-10-30 ENCOUNTER — Encounter: Payer: Medicare HMO | Admitting: Physical Therapy

## 2022-04-02 ENCOUNTER — Other Ambulatory Visit: Payer: Self-pay | Admitting: Student

## 2022-04-02 DIAGNOSIS — M5416 Radiculopathy, lumbar region: Secondary | ICD-10-CM

## 2022-04-21 ENCOUNTER — Ambulatory Visit
Admission: RE | Admit: 2022-04-21 | Discharge: 2022-04-21 | Disposition: A | Discharge status: Home / Self Care | Payer: Medicare HMO | Source: Ambulatory Visit | Attending: Student | Admitting: Student

## 2022-04-21 DIAGNOSIS — M5416 Radiculopathy, lumbar region: Secondary | ICD-10-CM

## 2022-04-21 MED ORDER — GADOBENATE DIMEGLUMINE 529 MG/ML IV SOLN
20.0000 mL | Freq: Once | INTRAVENOUS | Status: AC | PRN
  Administered 2022-04-21: 20 mL via INTRAVENOUS

## 2022-04-30 ENCOUNTER — Other Ambulatory Visit (HOSPITAL_COMMUNITY): Payer: Self-pay | Admitting: Student

## 2022-04-30 ENCOUNTER — Ambulatory Visit (HOSPITAL_BASED_OUTPATIENT_CLINIC_OR_DEPARTMENT_OTHER)
Admission: RE | Admit: 2022-04-30 | Discharge: 2022-04-30 | Disposition: A | Discharge status: Home / Self Care | Payer: Medicare HMO | Source: Ambulatory Visit | Attending: Student | Admitting: Student

## 2022-04-30 ENCOUNTER — Other Ambulatory Visit: Payer: Self-pay | Admitting: Student

## 2022-04-30 DIAGNOSIS — M5416 Radiculopathy, lumbar region: Secondary | ICD-10-CM | POA: Diagnosis present

## 2022-04-30 DIAGNOSIS — G8929 Other chronic pain: Secondary | ICD-10-CM

## 2022-05-01 ENCOUNTER — Ambulatory Visit (HOSPITAL_BASED_OUTPATIENT_CLINIC_OR_DEPARTMENT_OTHER)
Admission: RE | Admit: 2022-05-01 | Discharge: 2022-05-01 | Disposition: A | Discharge status: Home / Self Care | Payer: Medicare HMO | Source: Ambulatory Visit | Attending: Student | Admitting: Student

## 2022-05-01 DIAGNOSIS — M25552 Pain in left hip: Secondary | ICD-10-CM | POA: Insufficient documentation

## 2022-05-01 DIAGNOSIS — G8929 Other chronic pain: Secondary | ICD-10-CM | POA: Insufficient documentation

## 2022-05-07 ENCOUNTER — Other Ambulatory Visit (HOSPITAL_BASED_OUTPATIENT_CLINIC_OR_DEPARTMENT_OTHER): Payer: Medicare HMO

## 2022-06-23 NOTE — Progress Notes (Addendum)
Anesthesia Review:  PCP: Cardiologist : DR Kandee Keen - clearance on chart dated 05/26/22 with LOV note dated 05/31/22.  Chest x-ray : EKG : Echo : Stress test: Cardiac Cath :  Activity level:  Sleep Study/ CPAP : Fasting Blood Sugar :      / Checks Blood Sugar -- times a day:   Blood Thinner/ Instructions /Last Dose: ASA / Instructions/ Last Dose : 325 mg aspirin  DM- type  06/02/22- hgba1c- 6.2

## 2022-06-23 NOTE — Patient Instructions (Signed)
SURGICAL WAITING ROOM VISITATION Patients having surgery or a procedure may have no more than 2 support people in the waiting area - these visitors may rotate.   Children under the age of 30 must have an adult with them who is not the patient. If the patient needs to stay at the hospital during part of their recovery, the visitor guidelines for inpatient rooms apply. Pre-op nurse will coordinate an appropriate time for 1 support person to accompany patient in pre-op.  This support person may not rotate.    Please refer to the [facility] website for the visitor guidelines for Inpatients (after your surgery is over and you are in a regular room).       Your procedure is scheduled on:  07/20/2022    Report to [facility] Main Entrance    Report to admitting at    [phone]   Call this number if you have problems the morning of surgery [phone]   Do not eat food :After Midnight.   After Midnight you may have the following liquids until ___ 0600___ AM  DAY OF SURGERY  Water Non-Citrus Juices (without pulp, NO RED) Carbonated Beverages Black Coffee (NO MILK/CREAM OR CREAMERS, sugar ok)  Clear Tea (NO MILK/CREAM OR CREAMERS, sugar ok) regular and decaf                             Plain Jell-O (NO RED)                                           Fruit ices (not with fruit pulp, NO RED)                                     Popsicles (NO RED)                                                               Sports drinks like Gatorade (NO RED)                   The day of surgery:  Drink ONE (1) Pre-Surgery Clear Ensure or G2 at    0600 AM ( Completed by )  the morning of surgery. Drink in one sitting. Do not sip.  This drink was given to you during your hospital  pre-op appointment visit. Nothing else to drink after completing the  Pre-Surgery Clear Ensure or G2.             Oral Hygiene is also important to reduce your risk of infection.                                     Remember - BRUSH YOUR TEETH THE MORNING OF SURGERY WITH YOUR REGULAR TOOTHPASTE   Do NOT smoke after Midnight    Take these medicines the morning of surgery with A SIP OF WATER: allopurinol, metoprolol   DO NOT TAKE ANY ORAL DIABETIC MEDICATIONS DAY OF YOUR SURGERY  Bring CPAP mask and tubing day of surgery.                              You may not have any metal on your body including hair pins, jewelry, and body piercing             Do not wear make-up, lotions, powders, perfumes/cologne, or deodorant  Do not wear nail polish including gel and S&S, artificial/acrylic nails, or any other type of covering on natural nails including finger and toenails. If you have artificial nails, gel coating, etc. that needs to be removed by a nail salon please have this removed prior to surgery or surgery may need to be canceled/ delayed if the surgeon/ anesthesia feels like they are unable to be safely monitored.   Do not shave  48 hours prior to surgery.               Men may shave face and neck.   Do not bring valuables to the hospital. [place].   Contacts, dentures or bridgework may not be worn into surgery.   Bring small overnight bag day of surgery.   DO NOT [place]. PHARMACY WILL DISPENSE MEDICATIONS LISTED ON YOUR MEDICATION LIST TO YOU DURING YOUR ADMISSION [place] [facility]!    Patients discharged on the day of surgery will not be allowed to drive home.  Someone NEEDS to stay with you for the first 24 hours after anesthesia.   Special Instructions: Bring a copy of your healthcare power of attorney and living will documents         the day of surgery if you haven't scanned them before.              Please read over the following fact sheets you were given: IF YOU HAVE QUESTIONS ABOUT YOUR PRE-OP INSTRUCTIONS PLEASE CALL [phone]     [facility] - Preparing for Surgery Before surgery, you can  play an important role.  Because skin is not sterile, your skin needs to be as free of germs as possible.  You can reduce the number of germs on your skin by washing with CHG (chlorahexidine gluconate) soap before surgery.  CHG is an antiseptic cleaner which kills germs and bonds with the skin to continue killing germs even after washing. Please DO NOT use if you have an allergy to CHG or antibacterial soaps.  If your skin becomes reddened/irritated stop using the CHG and inform your nurse when you arrive at Short Stay. Do not shave (including legs and underarms) for at least 48 hours prior to the first CHG shower.  You may shave your face/neck. Please follow these instructions carefully:  1.  Shower with CHG Soap the night before surgery and the  morning of Surgery.  2.  If you choose to wash your hair, wash your hair first as usual with your  normal  shampoo.  3.  After you shampoo, rinse your hair and body thoroughly to remove the  shampoo.                           4.  Use CHG as you would any other liquid soap.  You can apply chg directly  to the skin and wash  Gently with a scrungie or clean washcloth.  5.  Apply the CHG Soap to your body ONLY FROM THE NECK DOWN.   Do not use on face/ open                           Wound or open sores. Avoid contact with eyes, ears mouth and genitals (private parts).                       Wash face,  Genitals (private parts) with your normal soap.             6.  Wash thoroughly, paying special attention to the area where your surgery  will be performed.  7.  Thoroughly rinse your body with warm water from the neck down.  8.  DO NOT shower/wash with your normal soap after using and rinsing off  the CHG Soap.                9.  Pat yourself dry with a clean towel.            10.  Wear clean pajamas.            11.  Place clean sheets on your bed the night of your first shower and do not  sleep with pets. Day of Surgery : Do not apply any  lotions/deodorants the morning of surgery.  Please wear clean clothes to the hospital/surgery center.  FAILURE TO FOLLOW THESE INSTRUCTIONS MAY RESULT IN THE CANCELLATION OF YOUR SURGERY PATIENT SIGNATURE_________________________________  NURSE SIGNATURE__________________________________  ________________________________________________________________________

## 2022-06-25 NOTE — H&P (Signed)
HIP ARTHROPLASTY ADMISSION H&P  Patient ID: Tim Torres MRN: [id number] DOB/AGE: 03-28-1951 71 y.o.  Chief Complaint: left hip pain.  Planned Procedure Date: 07/20/22 Medical Clearance by Leonia Reader PA-C   Cardiac Clearance by Cathlean Cower MD Pain Management clearance by Dr. Julio Sicks   HPI: Tim Torres is a 71 y.o. 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 The patient has a history of pain and functional disability in the left hip due to arthritis and has failed non-surgical conservative treatments for greater than 12 weeks to include NSAID's and/or analgesics, corticosteriod injections, use of assistive devices, and activity modification.  Onset of symptoms was abrupt, starting 6 months ago with gradually worsening course since that time. The patient noted no past surgery on the left hip.  Patient currently rates pain at 10 out of 10 with activity. Patient has night pain, worsening of pain with activity and weight bearing, and pain that interferes with activities of daily living.  Patient has evidence of subchondral sclerosis, periarticular osteophytes, and joint space narrowing by imaging studies.  There is no active infection.  Past Medical History:  Diagnosis Date   COVID-19 10/21/2019   Diabetes mellitus without complication (HCC)    Hypertension    Myocardial infarction [facility]) 1997   per cardiology records, MI 11/16/96, s/p PTCS distal CX   Sleep apnea    per patient "does not wear CPAP, unable to tolerate"   Past Surgical History:  Procedure Laterality Date   ANGIOPLASTY     per patient "back in 1997"   BACK SURGERY     Allergies  Allergen Reactions   Shellfish Allergy Shortness Of Breath and Swelling   Atorvastatin Other (See Comments)    Myalgia    Rosuvastatin Other (See Comments)    myalgia   Prior to Admission medications   Medication Sig Start Date End Date Taking? Authorizing Provider  allopurinol (ZYLOPRIM) 300 MG tablet Take 300 mg by  mouth daily. 08/28/19  Yes [provider]  aspirin 325 MG tablet Take 325 mg by mouth daily.   Yes [provider]  Cholecalciferol (VITAMIN D) 50 MCG (2000 UT) tablet Take 2,000 Units by mouth daily. Take with 1000 unit vitamin d to equal 3000 units daily   Yes [provider]  cholecalciferol (VITAMIN D3) 25 MCG (1000 UNIT) tablet Take 1,000 Units by mouth daily. Take with 2000 unit vitamin d to equal 3000 units daily   Yes [provider]  HYDROcodone-acetaminophen (NORCO) 10-325 MG tablet Take 1 tablet by mouth every 4 (four) hours as needed for moderate pain ((score 4 to 6)). Patient taking differently: Take 1-2 tablets by mouth 3 (three) times daily as needed for moderate pain ((score 4 to 6)). 06/28/20  Yes Meyran, Tiana Loft, NP  lovastatin (MEVACOR) 40 MG tablet Take 40 mg by mouth 2 (two) times daily.  08/28/19  Yes [provider]  Magnesium 500 MG TABS Take 500 mg by mouth daily.   Yes [provider]  meclizine (ANTIVERT) 25 MG tablet Take 1 tablet (25 mg total) by mouth 3 (three) times daily as needed for dizziness or nausea. 09/24/19  Yes Shah, Pratik D, DO  metFORMIN (GLUCOPHAGE-XR) 500 MG 24 hr tablet Take 500-1,000 mg by mouth See admin instructions. Take 500 mg in the morning and 1000 mg in the evening   Yes [provider]  metoprolol tartrate (LOPRESSOR) 50 MG tablet Take 50 mg by mouth 2 (two) times daily.  Yes [provider]  ondansetron (ZOFRAN) 4 MG tablet Take 1 tablet (4 mg total) by mouth every 6 (six) hours as needed for nausea. 09/24/19  Yes Shah, Pratik D, DO  sildenafil (VIAGRA) 100 MG tablet Take 100 mg by mouth as needed for erectile dysfunction. 04/27/22  Yes [provider]  zolpidem (AMBIEN) 5 MG tablet Take 5 mg by mouth at bedtime as needed for sleep.  08/28/19  Yes [provider]   Social History   Socioeconomic History   Marital status: Legally Separated     Spouse name: Not on file   Number of children: Not on file   Years of education: Not on file   Highest education level: Not on file  Occupational History   Not on file  Tobacco Use   Smoking status: Former    Types: Cigarettes    Quit date: 10/15/1982    Years since quitting: 39.7   Smokeless tobacco: Never  Vaping Use   Vaping Use: Never used  Substance and Sexual Activity   Alcohol use: Yes    Alcohol/week: 1.0 standard drink of alcohol    Types: 1 Cans of beer per week    Comment: "occasionallyy   Drug use: Never   Sexual activity: Not on file  Other Topics Concern   Not on file  Social History Narrative   Not on file   Social Determinants of Health   Financial Resource Strain: Not on file  Food Insecurity: Not on file  Transportation Needs: Not on file  Physical Activity: Not on file  Stress: Not on file  Social Connections: Not on file   No family history on file.  ROS: Currently denies lightheadedness, dizziness, Fever, chills, CP, SOB.   No personal history of DVT, PE, or CVA. + h/o MI in 1997 No loose teeth or dentures All other systems have been reviewed and were otherwise currently negative with the exception of those mentioned in the HPI and as above.  Objective: Vitals: Ht: 6'0" Wt: 240.5 lbs Temp: 97.6 BP: 149/75 Pulse: 56 O2 98% on room air.   Physical Exam: General: Alert, NAD. Trendelenberg Gait  HEENT: EOMI, Good Neck Extension  Pulm: No increased work of breathing.  Clear B/L A/P w/o crackle or wheeze.  CV: RRR, No m/g/r appreciated  GI: soft, NT, ND. BS x 4 quadrants Neuro: CN II-XII grossly intact without focal deficit.  Sensation intact distally Skin: No lesions in the area of chief complaint MSK/Surgical Site: non- TTP. ROM decreased d/t pain. + Stinchfield. + SLR. + FABER/FADIR. Decreased strength.  NVI.    Imaging Review Plain radiographs demonstrate severe degenerative joint disease of the left hip.   The bone quality appears to be  fair for age and reported activity level.  Preoperative templating of the joint replacement has been completed, documented, and submitted to the Operating Room personnel in order to optimize intra-operative equipment management.  Assessment: OA LEFT HIP Active Problems:   * No active hospital problems. *   Plan: Plan for Procedure(s): TOTAL HIP ARTHROPLASTY  The patient history, physical exam, clinical judgement of the provider and imaging are consistent with end stage degenerative joint disease and total joint arthroplasty is deemed medically necessary. The treatment options including medical management, injection therapy, and arthroplasty were discussed at length. The risks and benefits of Procedure(s): TOTAL HIP ARTHROPLASTY were presented and reviewed.  The risks of nonoperative treatment, versus surgical intervention including but not limited to continued pain, aseptic loosening, stiffness,  dislocation/subluxation, infection, bleeding, nerve injury, blood clots, cardiopulmonary complications, morbidity, mortality, among others were discussed. The patient verbalizes understanding and wishes to proceed with the plan.  Patient is being admitted for surgery, pain control, PT, prophylactic antibiotics, VTE prophylaxis, progressive ambulation, ADL's and discharge planning.   Dental prophylaxis discussed and recommended for 2 years postoperatively.  The patient does meet the criteria for TXA which will be used perioperatively.   ASA 325 mg will be used postoperatively for DVT prophylaxis in addition to SCDs, and early ambulation. Plan for Oxycodone, Tylenol and Gabapentin for pain.   Avoid NSAIDs due to worsening renal function Robaxin for muscle spasm.  Zofran for nausea and vomiting. Senokot for constipation prevention Pharmacy- [facility] The patient is planning to be discharged home with OPPT and into the care of his girlfriend Dutch Gray who can be reached at  [phone] Follow up appt 08/04/22 at [facility] [place] Office [phone] 06/25/2022 12:33 PM

## 2022-06-28 ENCOUNTER — Encounter (HOSPITAL_COMMUNITY): Payer: Self-pay

## 2022-06-28 ENCOUNTER — Other Ambulatory Visit: Payer: Self-pay

## 2022-06-28 ENCOUNTER — Encounter (HOSPITAL_COMMUNITY)
Admission: RE | Admit: 2022-06-28 | Discharge: 2022-06-28 | Disposition: A | Discharge status: Home / Self Care | Payer: Medicare HMO | Source: Ambulatory Visit | Attending: Orthopedic Surgery | Admitting: Orthopedic Surgery

## 2022-06-28 VITALS — BP 123/87 | HR 57 | Temp 97.9°F | Resp 16 | Ht 72.0 in | Wt 241.0 lb

## 2022-06-28 DIAGNOSIS — Z01818 Encounter for other preprocedural examination: Secondary | ICD-10-CM | POA: Diagnosis present

## 2022-06-28 HISTORY — DX: Unspecified osteoarthritis, unspecified site: M19.90

## 2022-06-28 LAB — CBC
HCT: 39.4 % (ref 39.0–52.0)
Hemoglobin: 13.1 g/dL (ref 13.0–17.0)
MCH: 31.3 pg (ref 26.0–34.0)
MCHC: 33.2 g/dL (ref 30.0–36.0)
MCV: 94 fL (ref 80.0–100.0)
Platelets: 146 10*3/uL — ABNORMAL LOW (ref 150–400)
RBC: 4.19 MIL/uL — ABNORMAL LOW (ref 4.22–5.81)
RDW: 14.2 % (ref 11.5–15.5)
WBC: 5.4 10*3/uL (ref 4.0–10.5)
nRBC: 0 % (ref 0.0–0.2)

## 2022-06-28 LAB — BASIC METABOLIC PANEL
Anion gap: 8 (ref 5–15)
BUN: 37 mg/dL — ABNORMAL HIGH (ref 8–23)
CO2: 25 mmol/L (ref 22–32)
Calcium: 9.8 mg/dL (ref 8.9–10.3)
Chloride: 108 mmol/L (ref 98–111)
Creatinine, Ser: 1.64 mg/dL — ABNORMAL HIGH (ref 0.61–1.24)
GFR, Estimated: 45 mL/min — ABNORMAL LOW (ref >60)
Glucose, Bld: 144 mg/dL — ABNORMAL HIGH (ref 70–99)
Potassium: 4.9 mmol/L (ref 3.5–5.1)
Sodium: 141 mmol/L (ref 135–145)

## 2022-06-28 LAB — SURGICAL PCR SCREEN
MRSA, PCR: NEGATIVE
Staphylococcus aureus: NEGATIVE

## 2022-06-28 LAB — GLUCOSE, CAPILLARY: Glucose-Capillary: 154 mg/dL — ABNORMAL HIGH (ref 70–99)

## 2022-06-30 NOTE — Progress Notes (Signed)
Not my patient

## 2022-07-12 ENCOUNTER — Ambulatory Visit: Payer: Medicare HMO

## 2022-07-18 ENCOUNTER — Encounter (HOSPITAL_COMMUNITY): Payer: Self-pay | Admitting: Orthopedic Surgery

## 2022-07-18 NOTE — Anesthesia Preprocedure Evaluation (Signed)
Anesthesia Evaluation  Patient identified by MRN, date of birth, ID band Patient awake    Reviewed: Allergy & Precautions, NPO status , Patient's Chart, lab work & pertinent test results, reviewed documented beta blocker date and time   Airway Mallampati: II  TM Distance: >3 FB Neck ROM: Full    Dental  (+) Dental Advisory Given,    Pulmonary sleep apnea and Continuous Positive Airway Pressure Ventilation , former smoker,  Unable to tolerate CPAP   Pulmonary exam normal breath sounds clear to auscultation       Cardiovascular hypertension, Pt. on medications and Pt. on home beta blockers + CAD and + Past MI  Normal cardiovascular exam Rhythm:Regular Rate:Bradycardia  MI 11/16/96 S/P PTCS with stent placed distal left Cx   Neuro/Psych negative neurological ROS  negative psych ROS   GI/Hepatic negative GI ROS, Neg liver ROS,   Endo/Other  diabetes, Well Controlled, Type 2, Oral Hypoglycemic AgentsObesity HLD Gout  Renal/GU Renal InsufficiencyRenal disease  negative genitourinary   Musculoskeletal  (+) Arthritis , Osteoarthritis,  OA left hip S/P PLIF L4-L5 2021   Abdominal (+) + obese,   Peds  Hematology negative hematology ROS (+)   Anesthesia Other Findings   Reproductive/Obstetrics ED                            Anesthesia Physical Anesthesia Plan  ASA: 3  Anesthesia Plan: Spinal   Post-op Pain Management: Regional block*, Tylenol PO (pre-op)* and Celebrex PO (pre-op)*   Induction: Intravenous  PONV Risk Score and Plan: 2 and Treatment may vary due to age or medical condition, Propofol infusion and Ondansetron  Airway Management Planned: Natural Airway, Simple Face Mask and Nasal Cannula  Additional Equipment: None  Intra-op Plan:   Post-operative Plan:   Informed Consent: I have reviewed the patients History and Physical, chart, labs and discussed the procedure including  the risks, benefits and alternatives for the proposed anesthesia with the patient or authorized representative who has indicated his/her understanding and acceptance.     Dental advisory given  Plan Discussed with: CRNA and Anesthesiologist  Anesthesia Plan Comments:        Anesthesia Quick Evaluation

## 2022-07-20 ENCOUNTER — Observation Stay (HOSPITAL_COMMUNITY)
Admission: RE | Admit: 2022-07-20 | Discharge: 2022-07-21 | Disposition: A | Discharge status: Home / Self Care | Payer: Medicare HMO | Attending: Orthopedic Surgery | Admitting: Orthopedic Surgery

## 2022-07-20 ENCOUNTER — Other Ambulatory Visit: Payer: Self-pay

## 2022-07-20 ENCOUNTER — Encounter (HOSPITAL_COMMUNITY): Payer: Self-pay | Admitting: Orthopedic Surgery

## 2022-07-20 ENCOUNTER — Encounter (HOSPITAL_COMMUNITY)
Admission: RE | Disposition: A | Discharge status: Home / Self Care | Payer: Self-pay | Source: Home / Self Care | Attending: Orthopedic Surgery

## 2022-07-20 ENCOUNTER — Ambulatory Visit (HOSPITAL_COMMUNITY): Payer: Medicare HMO

## 2022-07-20 ENCOUNTER — Ambulatory Visit (HOSPITAL_BASED_OUTPATIENT_CLINIC_OR_DEPARTMENT_OTHER): Payer: Medicare HMO | Admitting: Certified Registered"

## 2022-07-20 ENCOUNTER — Ambulatory Visit (HOSPITAL_COMMUNITY): Payer: Medicare HMO | Admitting: Physician Assistant

## 2022-07-20 DIAGNOSIS — Z8616 Personal history of COVID-19: Secondary | ICD-10-CM | POA: Diagnosis not present

## 2022-07-20 DIAGNOSIS — Z96642 Presence of left artificial hip joint: Secondary | ICD-10-CM

## 2022-07-20 DIAGNOSIS — E119 Type 2 diabetes mellitus without complications: Secondary | ICD-10-CM | POA: Insufficient documentation

## 2022-07-20 DIAGNOSIS — I10 Essential (primary) hypertension: Secondary | ICD-10-CM | POA: Diagnosis not present

## 2022-07-20 DIAGNOSIS — Z79899 Other long term (current) drug therapy: Secondary | ICD-10-CM | POA: Insufficient documentation

## 2022-07-20 DIAGNOSIS — M1612 Unilateral primary osteoarthritis, left hip: Secondary | ICD-10-CM | POA: Diagnosis present

## 2022-07-20 DIAGNOSIS — Z01818 Encounter for other preprocedural examination: Secondary | ICD-10-CM

## 2022-07-20 DIAGNOSIS — Z7982 Long term (current) use of aspirin: Secondary | ICD-10-CM | POA: Diagnosis not present

## 2022-07-20 DIAGNOSIS — Z87891 Personal history of nicotine dependence: Secondary | ICD-10-CM

## 2022-07-20 DIAGNOSIS — M1712 Unilateral primary osteoarthritis, left knee: Secondary | ICD-10-CM

## 2022-07-20 DIAGNOSIS — Z7984 Long term (current) use of oral hypoglycemic drugs: Secondary | ICD-10-CM | POA: Diagnosis not present

## 2022-07-20 DIAGNOSIS — I2510 Atherosclerotic heart disease of native coronary artery without angina pectoris: Secondary | ICD-10-CM

## 2022-07-20 HISTORY — PX: TOTAL HIP ARTHROPLASTY: SHX124

## 2022-07-20 LAB — GLUCOSE, CAPILLARY
Glucose-Capillary: 143 mg/dL — ABNORMAL HIGH (ref 70–99)
Glucose-Capillary: 150 mg/dL — ABNORMAL HIGH (ref 70–99)

## 2022-07-20 LAB — COMPREHENSIVE METABOLIC PANEL
ALT: 61 U/L — ABNORMAL HIGH (ref 0–44)
AST: 30 U/L (ref 15–41)
Albumin: 3.7 g/dL (ref 3.5–5.0)
Alkaline Phosphatase: 56 U/L (ref 38–126)
Anion gap: 7 (ref 5–15)
BUN: 29 mg/dL — ABNORMAL HIGH (ref 8–23)
CO2: 25 mmol/L (ref 22–32)
Calcium: 9.4 mg/dL (ref 8.9–10.3)
Chloride: 107 mmol/L (ref 98–111)
Creatinine, Ser: 1.39 mg/dL — ABNORMAL HIGH (ref 0.61–1.24)
GFR, Estimated: 55 mL/min — ABNORMAL LOW (ref >60)
Glucose, Bld: 173 mg/dL — ABNORMAL HIGH (ref 70–99)
Potassium: 4.6 mmol/L (ref 3.5–5.1)
Sodium: 139 mmol/L (ref 135–145)
Total Bilirubin: 0.8 mg/dL (ref 0.3–1.2)
Total Protein: 6.8 g/dL (ref 6.5–8.1)

## 2022-07-20 LAB — TYPE AND SCREEN
ABO/RH(D): A POS
Antibody Screen: NEGATIVE

## 2022-07-20 SURGERY — ARTHROPLASTY, HIP, TOTAL, ANTERIOR APPROACH
Anesthesia: Spinal | Site: Hip | Laterality: Left

## 2022-07-20 MED ORDER — METOCLOPRAMIDE HCL 5 MG PO TABS: 5.0000 mg | ORAL_TABLET | Freq: Three times a day (TID) | ORAL | Status: DC | PRN

## 2022-07-20 MED ORDER — ZOLPIDEM TARTRATE 5 MG PO TABS
5.0000 mg | ORAL_TABLET | Freq: Every evening | ORAL | Status: DC | PRN
  Administered 2022-07-20: 5 mg via ORAL
  Filled 2022-07-20: qty 1

## 2022-07-20 MED ORDER — DOCUSATE SODIUM 100 MG PO CAPS
100.0000 mg | ORAL_CAPSULE | Freq: Two times a day (BID) | ORAL | Status: DC
  Administered 2022-07-20 – 2022-07-21 (×2): 100 mg via ORAL
  Filled 2022-07-20 (×2): qty 1

## 2022-07-20 MED ORDER — OXYCODONE HCL 5 MG PO TABS
ORAL_TABLET | ORAL | Status: AC
  Administered 2022-07-20: 10 mg
  Filled 2022-07-20: qty 1

## 2022-07-20 MED ORDER — ORAL CARE MOUTH RINSE: 15.0000 mL | Freq: Once | OROMUCOSAL | Status: AC

## 2022-07-20 MED ORDER — METFORMIN HCL ER 500 MG PO TB24: 500.0000 mg | ORAL_TABLET | ORAL | Status: DC

## 2022-07-20 MED ORDER — KETOROLAC TROMETHAMINE 0.5 % OP SOLN
1.0000 [drp] | Freq: Four times a day (QID) | OPHTHALMIC | Status: DC
  Administered 2022-07-20: 1 [drp] via OPHTHALMIC
  Filled 2022-07-20: qty 5

## 2022-07-20 MED ORDER — DIPHENHYDRAMINE HCL 12.5 MG/5ML PO ELIX: 12.5000 mg | ORAL_SOLUTION | ORAL | Status: DC | PRN

## 2022-07-20 MED ORDER — LACTATED RINGERS IV BOLUS
250.0000 mL | Freq: Once | INTRAVENOUS | Status: AC
  Administered 2022-07-20: 250 mL via INTRAVENOUS

## 2022-07-20 MED ORDER — MIDAZOLAM HCL 2 MG/2ML IJ SOLN
INTRAMUSCULAR | Status: AC
  Filled 2022-07-20: qty 2

## 2022-07-20 MED ORDER — OXYCODONE HCL 5 MG PO TABS
5.0000 mg | ORAL_TABLET | Freq: Once | ORAL | Status: AC | PRN
  Administered 2022-07-20: 5 mg via ORAL

## 2022-07-20 MED ORDER — 0.9 % SODIUM CHLORIDE (POUR BTL) OPTIME
TOPICAL | Status: DC | PRN
  Administered 2022-07-20: 1000 mL

## 2022-07-20 MED ORDER — BISACODYL 10 MG RE SUPP: 10.0000 mg | Freq: Every day | RECTAL | Status: DC | PRN

## 2022-07-20 MED ORDER — PANTOPRAZOLE SODIUM 40 MG PO TBEC
40.0000 mg | DELAYED_RELEASE_TABLET | Freq: Every day | ORAL | Status: DC
  Administered 2022-07-21: 40 mg via ORAL
  Filled 2022-07-20: qty 1

## 2022-07-20 MED ORDER — DEXAMETHASONE SODIUM PHOSPHATE 10 MG/ML IJ SOLN
10.0000 mg | Freq: Once | INTRAMUSCULAR | Status: AC
  Administered 2022-07-21: 10 mg via INTRAVENOUS
  Filled 2022-07-20: qty 1

## 2022-07-20 MED ORDER — HYDROMORPHONE HCL 1 MG/ML IJ SOLN: 0.5000 mg | INTRAMUSCULAR | Status: DC | PRN

## 2022-07-20 MED ORDER — PRAVASTATIN SODIUM 40 MG PO TABS
40.0000 mg | ORAL_TABLET | Freq: Every day | ORAL | Status: DC
  Administered 2022-07-20: 40 mg via ORAL
  Filled 2022-07-20: qty 1

## 2022-07-20 MED ORDER — HYDROMORPHONE HCL 1 MG/ML IJ SOLN
INTRAMUSCULAR | Status: AC
  Filled 2022-07-20: qty 1

## 2022-07-20 MED ORDER — ACETAMINOPHEN 500 MG PO TABS
1000.0000 mg | ORAL_TABLET | Freq: Four times a day (QID) | ORAL | Status: DC
  Filled 2022-07-20: qty 2

## 2022-07-20 MED ORDER — METHOCARBAMOL 750 MG PO TABS: 750.0000 mg | ORAL_TABLET | Freq: Three times a day (TID) | ORAL | 0 refills | Status: DC | PRN

## 2022-07-20 MED ORDER — POLYVINYL ALCOHOL 1.4 % OP SOLN
1.0000 [drp] | OPHTHALMIC | Status: DC | PRN
Start: 2022-07-20 — End: 2022-07-20
  Administered 2022-07-20: 1 [drp] via OPHTHALMIC
  Filled 2022-07-20: qty 15

## 2022-07-20 MED ORDER — CEFAZOLIN SODIUM-DEXTROSE 2-4 GM/100ML-% IV SOLN
2.0000 g | Freq: Four times a day (QID) | INTRAVENOUS | Status: DC
  Administered 2022-07-20: 2 g via INTRAVENOUS

## 2022-07-20 MED ORDER — AMISULPRIDE (ANTIEMETIC) 5 MG/2ML IV SOLN: 10.0000 mg | Freq: Once | INTRAVENOUS | Status: DC | PRN

## 2022-07-20 MED ORDER — OXYCODONE HCL 5 MG PO TABS
ORAL_TABLET | ORAL | Status: AC
  Filled 2022-07-20: qty 1

## 2022-07-20 MED ORDER — PROPOFOL 10 MG/ML IV BOLUS
INTRAVENOUS | Status: DC | PRN
  Administered 2022-07-20: 20 mg via INTRAVENOUS

## 2022-07-20 MED ORDER — BUPIVACAINE LIPOSOME 1.3 % IJ SUSP
INTRAMUSCULAR | Status: DC | PRN
  Administered 2022-07-20: 10 mL

## 2022-07-20 MED ORDER — BUPIVACAINE IN DEXTROSE 0.75-8.25 % IT SOLN
INTRATHECAL | Status: DC | PRN
  Administered 2022-07-20: 2 mL via INTRATHECAL

## 2022-07-20 MED ORDER — METOCLOPRAMIDE HCL 5 MG/ML IJ SOLN: 5.0000 mg | Freq: Three times a day (TID) | INTRAMUSCULAR | Status: DC | PRN

## 2022-07-20 MED ORDER — CEFAZOLIN SODIUM-DEXTROSE 2-4 GM/100ML-% IV SOLN
2.0000 g | Freq: Four times a day (QID) | INTRAVENOUS | Status: AC
  Administered 2022-07-20 (×2): 2 g via INTRAVENOUS
  Filled 2022-07-20 (×2): qty 100

## 2022-07-20 MED ORDER — METOPROLOL TARTRATE 50 MG PO TABS
50.0000 mg | ORAL_TABLET | Freq: Two times a day (BID) | ORAL | Status: DC
  Administered 2022-07-20 – 2022-07-21 (×2): 50 mg via ORAL
  Filled 2022-07-20 (×2): qty 1

## 2022-07-20 MED ORDER — PHENYLEPHRINE 80 MCG/ML (10ML) SYRINGE FOR IV PUSH (FOR BLOOD PRESSURE SUPPORT)
PREFILLED_SYRINGE | INTRAVENOUS | Status: AC
  Filled 2022-07-20: qty 10

## 2022-07-20 MED ORDER — TRANEXAMIC ACID-NACL 1000-0.7 MG/100ML-% IV SOLN: 1000.0000 mg | Freq: Once | INTRAVENOUS | Status: AC

## 2022-07-20 MED ORDER — MUPIROCIN 2 % EX OINT
TOPICAL_OINTMENT | CUTANEOUS | Status: AC
  Filled 2022-07-20: qty 22

## 2022-07-20 MED ORDER — ONDANSETRON HCL 4 MG/2ML IJ SOLN: 4.0000 mg | Freq: Once | INTRAMUSCULAR | Status: DC | PRN

## 2022-07-20 MED ORDER — POVIDONE-IODINE 10 % EX SWAB: 2.0000 | Freq: Once | CUTANEOUS | Status: DC

## 2022-07-20 MED ORDER — MENTHOL 3 MG MT LOZG: 1.0000 | LOZENGE | OROMUCOSAL | Status: DC | PRN

## 2022-07-20 MED ORDER — BUPIVACAINE LIPOSOME 1.3 % IJ SUSP: 10.0000 mL | Freq: Once | INTRAMUSCULAR | Status: DC

## 2022-07-20 MED ORDER — ONDANSETRON HCL 4 MG PO TABS: 4.0000 mg | ORAL_TABLET | Freq: Four times a day (QID) | ORAL | Status: DC | PRN

## 2022-07-20 MED ORDER — GABAPENTIN 300 MG PO CAPS: 300.0000 mg | ORAL_CAPSULE | Freq: Two times a day (BID) | ORAL | 0 refills | Status: DC

## 2022-07-20 MED ORDER — ONDANSETRON HCL 4 MG/2ML IJ SOLN
INTRAMUSCULAR | Status: AC
  Filled 2022-07-20: qty 2

## 2022-07-20 MED ORDER — OXYCODONE HCL 5 MG/5ML PO SOLN: 5.0000 mg | Freq: Once | ORAL | Status: AC | PRN

## 2022-07-20 MED ORDER — SENNOSIDES-DOCUSATE SODIUM 8.6-50 MG PO TABS
1.0000 | ORAL_TABLET | Freq: Every day | ORAL | 0 refills | Status: DC
Start: 2022-07-20 — End: 2023-10-20

## 2022-07-20 MED ORDER — BUPIVACAINE LIPOSOME 1.3 % IJ SUSP
INTRAMUSCULAR | Status: AC
  Filled 2022-07-20: qty 10

## 2022-07-20 MED ORDER — TRAMADOL HCL 50 MG PO TABS
ORAL_TABLET | ORAL | Status: AC
  Filled 2022-07-20: qty 2

## 2022-07-20 MED ORDER — MAGNESIUM CITRATE PO SOLN: 1.0000 | Freq: Once | ORAL | Status: DC | PRN

## 2022-07-20 MED ORDER — LACTATED RINGERS IV SOLN: INTRAVENOUS | Status: DC

## 2022-07-20 MED ORDER — PHENYLEPHRINE 80 MCG/ML (10ML) SYRINGE FOR IV PUSH (FOR BLOOD PRESSURE SUPPORT)
PREFILLED_SYRINGE | INTRAVENOUS | Status: DC | PRN
  Administered 2022-07-20: 40 ug via INTRAVENOUS

## 2022-07-20 MED ORDER — LIDOCAINE 2% (20 MG/ML) 5 ML SYRINGE
INTRAMUSCULAR | Status: DC | PRN
  Administered 2022-07-20: 40 mg via INTRAVENOUS

## 2022-07-20 MED ORDER — METHOCARBAMOL 500 MG IVPB - SIMPLE MED: 500.0000 mg | Freq: Four times a day (QID) | INTRAVENOUS | Status: DC | PRN

## 2022-07-20 MED ORDER — DEXAMETHASONE SODIUM PHOSPHATE 10 MG/ML IJ SOLN
INTRAMUSCULAR | Status: AC
  Filled 2022-07-20: qty 1

## 2022-07-20 MED ORDER — METFORMIN HCL ER 500 MG PO TB24
1000.0000 mg | ORAL_TABLET | Freq: Every day | ORAL | Status: DC
  Administered 2022-07-20: 1000 mg via ORAL
  Filled 2022-07-20: qty 2

## 2022-07-20 MED ORDER — ALLOPURINOL 300 MG PO TABS
300.0000 mg | ORAL_TABLET | Freq: Every day | ORAL | Status: DC
  Administered 2022-07-21: 300 mg via ORAL
  Filled 2022-07-20: qty 1

## 2022-07-20 MED ORDER — TRANEXAMIC ACID-NACL 1000-0.7 MG/100ML-% IV SOLN
1000.0000 mg | Freq: Once | INTRAVENOUS | Status: AC
  Administered 2022-07-20: 1000 mg via INTRAVENOUS

## 2022-07-20 MED ORDER — CEFAZOLIN SODIUM-DEXTROSE 2-4 GM/100ML-% IV SOLN
INTRAVENOUS | Status: AC
  Filled 2022-07-20: qty 100

## 2022-07-20 MED ORDER — ALUM & MAG HYDROXIDE-SIMETH 200-200-20 MG/5ML PO SUSP
30.0000 mL | ORAL | Status: DC | PRN
Start: 2022-07-20 — End: 2022-07-21

## 2022-07-20 MED ORDER — SODIUM CHLORIDE FLUSH 0.9 % IV SOLN
INTRAVENOUS | Status: DC | PRN
  Administered 2022-07-20: 10 mL

## 2022-07-20 MED ORDER — ACETAMINOPHEN 500 MG PO TABS
1000.0000 mg | ORAL_TABLET | Freq: Once | ORAL | Status: AC
  Administered 2022-07-20: 1000 mg via ORAL
  Filled 2022-07-20: qty 2

## 2022-07-20 MED ORDER — EPHEDRINE SULFATE-NACL 50-0.9 MG/10ML-% IV SOSY
PREFILLED_SYRINGE | INTRAVENOUS | Status: DC | PRN
  Administered 2022-07-20: 5 mg via INTRAVENOUS
  Administered 2022-07-20: 10 mg via INTRAVENOUS
  Administered 2022-07-20 (×2): 5 mg via INTRAVENOUS

## 2022-07-20 MED ORDER — METHOCARBAMOL 500 MG PO TABS: 500.0000 mg | ORAL_TABLET | Freq: Four times a day (QID) | ORAL | Status: DC | PRN

## 2022-07-20 MED ORDER — HYDROMORPHONE HCL 1 MG/ML IJ SOLN
0.5000 mg | INTRAMUSCULAR | Status: DC | PRN
  Administered 2022-07-20: 1 mg via INTRAVENOUS

## 2022-07-20 MED ORDER — LIDOCAINE HCL (PF) 2 % IJ SOLN
INTRAMUSCULAR | Status: AC
  Filled 2022-07-20: qty 5

## 2022-07-20 MED ORDER — HYDROMORPHONE HCL 2 MG PO TABS: 2.0000 mg | ORAL_TABLET | ORAL | Status: DC | PRN

## 2022-07-20 MED ORDER — OXYCODONE HCL 5 MG PO TABS: 10.0000 mg | ORAL_TABLET | ORAL | Status: DC | PRN

## 2022-07-20 MED ORDER — HYDROCODONE-ACETAMINOPHEN 10-325 MG PO TABS
1.0000 | ORAL_TABLET | Freq: Three times a day (TID) | ORAL | Status: DC
  Administered 2022-07-20 – 2022-07-21 (×3): 1 via ORAL
  Filled 2022-07-20 (×4): qty 1

## 2022-07-20 MED ORDER — ONDANSETRON HCL 4 MG/2ML IJ SOLN: 4.0000 mg | Freq: Four times a day (QID) | INTRAMUSCULAR | Status: DC | PRN

## 2022-07-20 MED ORDER — TRAMADOL HCL 50 MG PO TABS
50.0000 mg | ORAL_TABLET | Freq: Four times a day (QID) | ORAL | Status: DC
  Administered 2022-07-20 – 2022-07-21 (×5): 50 mg via ORAL
  Filled 2022-07-20 (×4): qty 1

## 2022-07-20 MED ORDER — PHENOL 1.4 % MT LIQD: 1.0000 | OROMUCOSAL | Status: DC | PRN

## 2022-07-20 MED ORDER — LIDOCAINE HCL (PF) 2 % IJ SOLN
INTRAMUSCULAR | Status: AC
Start: 2022-07-20 — End: ?
  Filled 2022-07-20: qty 5

## 2022-07-20 MED ORDER — PROPOFOL 1000 MG/100ML IV EMUL
INTRAVENOUS | Status: AC
Start: 2022-07-20 — End: ?
  Filled 2022-07-20: qty 100

## 2022-07-20 MED ORDER — MIDAZOLAM HCL 2 MG/2ML IJ SOLN
INTRAMUSCULAR | Status: DC | PRN
  Administered 2022-07-20: 2 mg via INTRAVENOUS

## 2022-07-20 MED ORDER — POLYETHYLENE GLYCOL 3350 17 G PO PACK: 17.0000 g | PACK | Freq: Every day | ORAL | Status: DC | PRN

## 2022-07-20 MED ORDER — ASPIRIN 325 MG PO TBEC
325.0000 mg | DELAYED_RELEASE_TABLET | Freq: Every day | ORAL | Status: DC
  Administered 2022-07-21: 325 mg via ORAL
  Filled 2022-07-20: qty 1

## 2022-07-20 MED ORDER — EPHEDRINE 5 MG/ML INJ
INTRAVENOUS | Status: AC
  Filled 2022-07-20: qty 5

## 2022-07-20 MED ORDER — OXYCODONE HCL 10 MG PO TABS: 5.0000 mg | ORAL_TABLET | Freq: Three times a day (TID) | ORAL | 0 refills | Status: DC | PRN

## 2022-07-20 MED ORDER — PROPOFOL 500 MG/50ML IV EMUL
INTRAVENOUS | Status: DC | PRN
  Administered 2022-07-20: 60 ug/kg/min via INTRAVENOUS

## 2022-07-20 MED ORDER — DEXAMETHASONE SODIUM PHOSPHATE 10 MG/ML IJ SOLN
8.0000 mg | Freq: Once | INTRAMUSCULAR | Status: AC
  Administered 2022-07-20: 8 mg via INTRAVENOUS

## 2022-07-20 MED ORDER — CEFAZOLIN SODIUM-DEXTROSE 2-4 GM/100ML-% IV SOLN
2.0000 g | INTRAVENOUS | Status: AC
  Administered 2022-07-20: 2 g via INTRAVENOUS
  Filled 2022-07-20: qty 100

## 2022-07-20 MED ORDER — TRANEXAMIC ACID-NACL 1000-0.7 MG/100ML-% IV SOLN
INTRAVENOUS | Status: AC
  Filled 2022-07-20: qty 100

## 2022-07-20 MED ORDER — OXYCODONE HCL 5 MG PO TABS
5.0000 mg | ORAL_TABLET | ORAL | Status: DC | PRN
  Administered 2022-07-20 – 2022-07-21 (×2): 5 mg via ORAL
  Filled 2022-07-20: qty 1

## 2022-07-20 MED ORDER — PROPOFOL 10 MG/ML IV BOLUS
INTRAVENOUS | Status: AC
  Filled 2022-07-20: qty 20

## 2022-07-20 MED ORDER — FENTANYL CITRATE (PF) 100 MCG/2ML IJ SOLN
INTRAMUSCULAR | Status: DC | PRN
  Administered 2022-07-20: 50 ug via INTRAVENOUS

## 2022-07-20 MED ORDER — HYDROMORPHONE HCL 2 MG PO TABS: 1.0000 mg | ORAL_TABLET | ORAL | Status: DC | PRN

## 2022-07-20 MED ORDER — FENTANYL CITRATE (PF) 100 MCG/2ML IJ SOLN
INTRAMUSCULAR | Status: AC
  Filled 2022-07-20: qty 2

## 2022-07-20 MED ORDER — ONDANSETRON 4 MG PO TBDP: 4.0000 mg | ORAL_TABLET | Freq: Two times a day (BID) | ORAL | 0 refills | Status: DC | PRN

## 2022-07-20 MED ORDER — ACETAMINOPHEN 500 MG PO TABS: 1000.0000 mg | ORAL_TABLET | Freq: Three times a day (TID) | ORAL | 0 refills | Status: DC | PRN

## 2022-07-20 MED ORDER — LACTATED RINGERS IV BOLUS
500.0000 mL | Freq: Once | INTRAVENOUS | Status: AC
  Administered 2022-07-20: 500 mL via INTRAVENOUS

## 2022-07-20 MED ORDER — HYDROMORPHONE HCL 1 MG/ML IJ SOLN: 0.2500 mg | INTRAMUSCULAR | Status: DC | PRN

## 2022-07-20 MED ORDER — SODIUM CHLORIDE (PF) 0.9 % IJ SOLN
INTRAMUSCULAR | Status: AC
  Filled 2022-07-20: qty 10

## 2022-07-20 MED ORDER — METHOCARBAMOL 500 MG PO TABS
ORAL_TABLET | ORAL | Status: AC
  Filled 2022-07-20: qty 1

## 2022-07-20 MED ORDER — METHOCARBAMOL 500 MG PO TABS
500.0000 mg | ORAL_TABLET | Freq: Four times a day (QID) | ORAL | Status: DC | PRN
Start: 2022-07-20 — End: 2022-07-20
  Administered 2022-07-20: 500 mg via ORAL

## 2022-07-20 MED ORDER — ONDANSETRON HCL 4 MG/2ML IJ SOLN
INTRAMUSCULAR | Status: DC | PRN
  Administered 2022-07-20: 4 mg via INTRAVENOUS

## 2022-07-20 MED ORDER — CHLORHEXIDINE GLUCONATE 0.12 % MT SOLN
15.0000 mL | Freq: Once | OROMUCOSAL | Status: AC
  Administered 2022-07-20: 15 mL via OROMUCOSAL

## 2022-07-20 MED ORDER — TRANEXAMIC ACID-NACL 1000-0.7 MG/100ML-% IV SOLN
1000.0000 mg | INTRAVENOUS | Status: AC
  Administered 2022-07-20: 1000 mg via INTRAVENOUS
  Filled 2022-07-20: qty 100

## 2022-07-20 MED ORDER — ACETAMINOPHEN 325 MG PO TABS: 325.0000 mg | ORAL_TABLET | Freq: Four times a day (QID) | ORAL | Status: DC | PRN

## 2022-07-20 SURGICAL SUPPLY — 47 items
APL PRP STRL LF DISP 70% ISPRP (MISCELLANEOUS) ×1
BAG COUNTER SPONGE SURGICOUNT (BAG) IMPLANT
BAG SPEC THK2 15X12 ZIP CLS (MISCELLANEOUS)
BAG SPNG CNTER NS LX DISP (BAG) ×1
BAG ZIPLOCK 12X15 (MISCELLANEOUS) IMPLANT
BLADE SAG 18X100X1.27 (BLADE) ×2 IMPLANT
BLADE SURG SZ10 CARB STEEL (BLADE) ×2 IMPLANT
CHLORAPREP W/TINT 26 (MISCELLANEOUS) ×2 IMPLANT
CLSR STERI-STRIP ANTIMIC 1/2X4 (GAUZE/BANDAGES/DRESSINGS) ×2 IMPLANT
COVER PERINEAL POST (MISCELLANEOUS) ×2 IMPLANT
COVER SURGICAL LIGHT HANDLE (MISCELLANEOUS) ×2 IMPLANT
DRAPE IMP U-DRAPE 54X76 (DRAPES) ×2 IMPLANT
DRAPE STERI IOBAN 125X83 (DRAPES) ×2 IMPLANT
DRAPE U-SHAPE 47X51 STRL (DRAPES) ×4 IMPLANT
DRSG MEPILEX POST OP 4X8 (GAUZE/BANDAGES/DRESSINGS) ×2 IMPLANT
ELECT REM PT RETURN 15FT ADLT (MISCELLANEOUS) ×2 IMPLANT
GLOVE BIO SURGEON STRL SZ7.5 (GLOVE) ×2 IMPLANT
GLOVE BIOGEL PI IND STRL 7.5 (GLOVE) ×2 IMPLANT
GLOVE BIOGEL PI IND STRL 8 (GLOVE) ×2 IMPLANT
GLOVE SURG SYN 7.5  E (GLOVE) ×1
GLOVE SURG SYN 7.5 E (GLOVE) ×1 IMPLANT
GLOVE SURG SYN 7.5 PF PI (GLOVE) ×2 IMPLANT
GOWN SPEC L4 XLG W/TWL (GOWN DISPOSABLE) ×2 IMPLANT
GOWN STRL REUS W/ TWL LRG LVL3 (GOWN DISPOSABLE) ×2 IMPLANT
GOWN STRL REUS W/TWL LRG LVL3 (GOWN DISPOSABLE) ×1
HEAD BIOLOX HIP 36/-2.5 (Joint) IMPLANT
HIP BIOLOX HD 36/-2.5 (Joint) ×1 IMPLANT
HOLDER FOLEY CATH W/STRAP (MISCELLANEOUS) IMPLANT
INSERT 0 DEG POLY  36 F (Miscellaneous) ×1 IMPLANT
INSERT 0 DEG POLY 36 F (Miscellaneous) IMPLANT
KIT TURNOVER KIT A (KITS) IMPLANT
MANIFOLD NEPTUNE II (INSTRUMENTS) ×2 IMPLANT
NS IRRIG 1000ML POUR BTL (IV SOLUTION) ×2 IMPLANT
PACK ANTERIOR HIP CUSTOM (KITS) ×2 IMPLANT
PROTECTOR NERVE ULNAR (MISCELLANEOUS) ×2 IMPLANT
SCREW HEX LP 6.5X20 (Screw) IMPLANT
SHELL TRIDENT II CLUST SZ 56MM (Shell) IMPLANT
SPIKE FLUID TRANSFER (MISCELLANEOUS) ×4 IMPLANT
STEM ACCOLADE SZ 6 (Hips) IMPLANT
SUT MNCRL AB 3-0 PS2 18 (SUTURE) ×2 IMPLANT
SUT VIC AB 0 CT1 36 (SUTURE) ×2 IMPLANT
SUT VIC AB 1 CT1 36 (SUTURE) ×2 IMPLANT
SUT VIC AB 2-0 CT1 27 (SUTURE) ×2
SUT VIC AB 2-0 CT1 TAPERPNT 27 (SUTURE) ×4 IMPLANT
TRAY FOLEY MTR SLVR 16FR STAT (SET/KITS/TRAYS/PACK) IMPLANT
TUBE SUCTION HIGH CAP CLEAR NV (SUCTIONS) ×2 IMPLANT
WATER STERILE IRR 1000ML POUR (IV SOLUTION) ×4 IMPLANT

## 2022-07-20 NOTE — Interval H&P Note (Signed)
History and Physical Interval Note:  07/20/2022 8:57 AM  Tim Torres  has presented today for surgery, with the diagnosis of OA LEFT HIP.  The various methods of treatment have been discussed with the patient and family. After consideration of risks, benefits and other options for treatment, the patient has consented to  Procedure(s): TOTAL HIP ARTHROPLASTY ANTERIOR APPROACH (Left) as a surgical intervention.  The patient's history has been reviewed, patient examined, no change in status, stable for surgery.  I have reviewed the patient's chart and labs.  Questions were answered to the patient's satisfaction.     Sheral Apley

## 2022-07-20 NOTE — Transfer of Care (Signed)
Immediate Anesthesia Transfer of Care Note  Patient: Tim Torres  Procedure(s) Performed: TOTAL HIP ARTHROPLASTY ANTERIOR APPROACH (Left: Hip)  Patient Location: PACU  Anesthesia Type:Spinal  Level of Consciousness: awake, alert  and patient cooperative  Airway & Oxygen Therapy: Patient Spontanous Breathing and Patient connected to face mask oxygen  Post-op Assessment: Report given to RN and Post -op Vital signs reviewed and stable  Post vital signs: Reviewed and stable  Last Vitals:  Vitals Value Taken Time  BP    Temp    Pulse 68 07/20/22 1055  Resp 17 07/20/22 1055  SpO2 100 % 07/20/22 1055  Vitals shown include unvalidated device data.  Last Pain:  Vitals:   07/20/22 0725  TempSrc:   PainSc: 3          Complications: No notable events documented.

## 2022-07-20 NOTE — Addendum Note (Signed)
Addendum  created 07/20/22 1255 by Mal Amabile, MD   Order list changed, Pharmacy for encounter modified

## 2022-07-20 NOTE — Addendum Note (Signed)
Addendum  created 07/20/22 1258 by Mal Amabile, MD   Clinical Note Signed

## 2022-07-20 NOTE — Progress Notes (Signed)
Patient and family member requesting for patient to be admitted overnight for pain control. Called and left voicemail for Megan,PA. Waiting on return call.

## 2022-07-20 NOTE — Plan of Care (Signed)
  Problem: Activity: Goal: Ability to avoid complications of mobility impairment will improve Outcome: Progressing   Problem: Pain Management: Goal: Pain level will decrease with appropriate interventions Outcome: Progressing   Problem: Pain Managment: Goal: General experience of comfort will improve Outcome: Progressing   

## 2022-07-20 NOTE — Anesthesia Postprocedure Evaluation (Addendum)
Anesthesia Post Note  Patient: Tim Torres  Procedure(s) Performed: TOTAL HIP ARTHROPLASTY ANTERIOR APPROACH (Left: Hip)     Patient location during evaluation: PACU Anesthesia Type: Spinal Level of consciousness: oriented and awake and alert Pain management: pain level controlled Vital Signs Assessment: post-procedure vital signs reviewed and stable Respiratory status: spontaneous breathing, respiratory function stable and nonlabored ventilation Cardiovascular status: blood pressure returned to baseline and stable Postop Assessment: spinal receding, no headache and no backache Anesthetic complications: no Comments: Patient complained of left eye pain with FB sensation. I cannot visualize any corneal abrasion but will treat as such.    No notable events documented.  Last Vitals:  Vitals:   07/20/22 1100 07/20/22 1115  BP: (!) 115/57 131/67  Pulse: (!) 58 (!) 58  Resp: 14 18  Temp:    SpO2: 100% 100%    Last Pain:  Vitals:   07/20/22 1130  TempSrc:   PainSc: 0-No pain    LLE Motor Response: Purposeful movement (07/20/22 1130) LLE Sensation: Decreased;Numbness (07/20/22 1130) RLE Motor Response: Purposeful movement (07/20/22 1130) RLE Sensation: Decreased;Numbness (07/20/22 1130) L Sensory Level: L4-Anterior knee, lower leg (07/20/22 1130) R Sensory Level: L4-Anterior knee, lower leg (07/20/22 1130)  Mayerli Kirst A.

## 2022-07-20 NOTE — Anesthesia Procedure Notes (Signed)
Procedure Name: MAC Date/Time: 07/20/2022 9:00 AM  Performed by: Eben Burow, CRNAPre-anesthesia Checklist: Patient identified, Emergency Drugs available, Suction available, Patient being monitored and Timeout performed Oxygen Delivery Method: Simple face mask Placement Confirmation: positive ETCO2

## 2022-07-20 NOTE — Anesthesia Procedure Notes (Addendum)
Spinal  Patient location during procedure: OR Start time: 07/20/2022 9:04 AM End time: 07/20/2022 9:08 AM Reason for block: surgical anesthesia Staffing Performed: anesthesiologist  Anesthesiologist: Mal Amabile, MD Performed by: Mal Amabile, MD Authorized by: Mal Amabile, MD   Preanesthetic Checklist Completed: patient identified, IV checked, site marked, risks and benefits discussed, surgical consent, monitors and equipment checked, pre-op evaluation and timeout performed Spinal Block Patient position: sitting Prep: DuraPrep and site prepped and draped Patient monitoring: heart rate, cardiac monitor, continuous pulse ox and blood pressure Approach: midline Location: L3-4 Injection technique: single-shot Needle Needle type: Pencan  Needle gauge: 24 G Needle length: 9 cm Needle insertion depth: 8 cm Assessment Sensory level: T4 Events: CSF return Additional Notes Patient tolerated procedure well. Adequate sensory level. Attempt x 2. Difficult due to previous back surgery.

## 2022-07-20 NOTE — Discharge Instructions (Signed)

## 2022-07-20 NOTE — Interval H&P Note (Signed)
History and Physical Interval Note:  07/20/2022 7:01 AM  Tim Torres  has presented today for surgery, with the diagnosis of OA LEFT HIP.  The various methods of treatment have been discussed with the patient and family. After consideration of risks, benefits and other options for treatment, the patient has consented to  Procedure(s): TOTAL HIP ARTHROPLASTY ANTERIOR APPROACH (Left) as a surgical intervention.  The patient's history has been reviewed, patient examined, no change in status, stable for surgery.  I have reviewed the patient's chart and labs.  Questions were answered to the patient's satisfaction.     Sheral Apley

## 2022-07-20 NOTE — Op Note (Signed)
07/20/2022  10:51 AM  PATIENT:  Tim Torres   MRN: [id number]  PRE-OPERATIVE DIAGNOSIS:  OA LEFT HIP  POST-OPERATIVE DIAGNOSIS:  OA LEFT HIP  PROCEDURE:  Procedure(s): TOTAL HIP ARTHROPLASTY ANTERIOR APPROACH  PREOPERATIVE INDICATIONS:    Tim Torres is an 71 y.o. male who has a diagnosis of <principal problem not specified> and elected for surgical management after failing conservative treatment.  The risks benefits and alternatives were discussed with the patient including but not limited to the risks of nonoperative treatment, versus surgical intervention including infection, bleeding, nerve injury, periprosthetic fracture, the need for revision surgery, dislocation, leg length discrepancy, blood clots, cardiopulmonary complications, morbidity, mortality, among others, and they were willing to proceed.     OPERATIVE REPORT     SURGEON:   Renette Butters, MD    ASSISTANT:  Aggie Moats, PA-C, he was present and scrubbed throughout the case, critical for completion in a timely fashion, and for retraction, instrumentation, and closure.     ANESTHESIA:  General    COMPLICATIONS:  None.     COMPONENTS:  Stryker acolade fit femur size 6 with a 36 mm -2.5 head ball and an acetabular shell size 56 with a  polyethylene liner    PROCEDURE IN DETAIL:   The patient was met in the holding area and  identified.  The appropriate hip was identified and marked at the operative site.  The patient was then transported to the OR  and  placed under anesthesia per that record.  At that point, the patient was  placed in the supine position and  secured to the operating room table and all bony prominences padded. He received pre-operative antibiotics    The operative lower extremity was prepped from the iliac crest to the distal leg.  Sterile draping was performed.  Time out was performed prior to incision.      Skin incision was made just 2 cm lateral to the ASIS  extending in line  with the tensor fascia lata. Electrocautery was used to control all bleeders. I dissected down sharply to the fascia of the tensor fascia lata was confirmed that the muscle fibers beneath were running posteriorly. I then incised the fascia over the superficial tensor fascia lata in line with the incision. The fascia was elevated off the anterior aspect of the muscle the muscle was retracted posteriorly and protected throughout the case. I then used electrocautery to incise the tensor fascia lata fascia control and all bleeders. Immediately visible was the fat over top of the anterior neck and capsule.  I removed the anterior fat from the capsule and elevated the rectus muscle off of the anterior capsule. I then removed a large time of capsule. The retractors were then placed over the anterior acetabulum as well as around the superior and inferior neck.  I then made a femoral neck cut. Then used the power corkscrew to remove the femoral head from the acetabulum and thoroughly irrigated the acetabulum. I sized the femoral head.    I then exposed the deep acetabulum, cleared out any tissue including the ligamentum teres.   After adequate visualization, I excised the labrum, and then sequentially reamed.  I then impacted the acetabular implant into place using fluoroscopy for guidance.  Appropriate version and inclination was confirmed clinically matching their bony anatomy, and with fluoroscopy.  I placed a 20 mm screw in the posterior/superio position with an excellent bite.    I then placed the polyethylene liner in  place  I then adducted the leg and released the external rotators from the posterior femur allowing it to be easily delivered up lateral and anterior to the acetabulum for preparation of the femoral canal.    I then prepared the proximal femur using the cookie-cutter and then sequentially reamed and broached.  A trial broach, neck, and head was utilized, and I reduced the hip and used  floroscopy to assess the neck length and femoral implant.  I then impacted the femoral prosthesis into place into the appropriate version. The hip was then reduced and fluoroscopy confirmed appropriate position. Leg lengths were restored.  I then irrigated the hip copiously again with, and repaired the fascia with Vicryl, followed by monocryl for the subcutaneous tissue, Monocryl for the skin, Steri-Strips and sterile gauze. The patient was then awakened and returned to PACU in stable and satisfactory condition. There were no complications.  POST OPERATIVE PLAN: WBAT, DVT px: SCD's/TED, ambulation and chemical dvt px  Edmonia Lynch, MD Orthopedic Surgeon [phone]

## 2022-07-21 ENCOUNTER — Encounter (HOSPITAL_COMMUNITY): Payer: Self-pay | Admitting: Orthopedic Surgery

## 2022-07-21 DIAGNOSIS — M1612 Unilateral primary osteoarthritis, left hip: Secondary | ICD-10-CM | POA: Diagnosis not present

## 2022-07-21 MED ORDER — PRAVASTATIN SODIUM 20 MG PO TABS
80.0000 mg | ORAL_TABLET | Freq: Every day | ORAL | Status: DC
  Administered 2022-07-21: 80 mg via ORAL
  Filled 2022-07-21: qty 4

## 2022-07-21 MED ORDER — METFORMIN HCL 500 MG PO TABS
500.0000 mg | ORAL_TABLET | Freq: Every day | ORAL | Status: DC
  Administered 2022-07-21: 500 mg via ORAL
  Filled 2022-07-21: qty 1

## 2022-07-21 NOTE — Plan of Care (Signed)
  Problem: Pain Management: Goal: Pain level will decrease with appropriate interventions 07/21/2022 0715 by Penelope Galas, RN Outcome: Progressing 07/20/2022 1805 by Penelope Galas, RN Outcome: Progressing   Problem: Activity: Goal: Risk for activity intolerance will decrease 07/21/2022 0715 by Penelope Galas, RN Outcome: Progressing 07/20/2022 1805 by Penelope Galas, RN Outcome: Progressing   Problem: Safety: Goal: Ability to remain free from injury will improve 07/21/2022 0715 by Penelope Galas, RN Outcome: Progressing 07/20/2022 1805 by Penelope Galas, RN Outcome: Progressing

## 2022-07-21 NOTE — Progress Notes (Signed)
Physical Therapy Treatment Patient Details Name: Tim Torres MRN: [id number] DOB: 1951-04-08 Today's Date: 07/21/2022   History of Present Illness pt is a 71yo male presenting s/p L-THA, AA on 07/20/22. PMH: DM, HTN, hx of MI.    PT Comments    Pt seen for second session POD1, eager to mobilize with PT. Required supervision for transfers and ambulation in hallway with RW, min guard for stair training, no overt LOB noted. All education completed and pt nor fiancee have further questions. Pt has met mobility goals for safe discharge home with fiancee supervision/assistance. PT is signing off, should needs change, please reconsult. Thank you for this referral.      Recommendations for follow up therapy are one component of a multi-disciplinary discharge planning process, led by the attending physician.  Recommendations may be updated based on patient status, additional functional criteria and insurance authorization.  Follow Up Recommendations  Follow physician's recommendations for discharge plan and follow up therapies     Assistance Recommended at Discharge Set up Supervision/Assistance  Patient can return home with the following A little help with walking and/or transfers;A little help with bathing/dressing/bathroom;Assistance with cooking/housework;Assist for transportation;Help with stairs or ramp for entrance   Equipment Recommendations  None recommended by PT    Recommendations for Other Services       Precautions / Restrictions Precautions Precautions: Fall Restrictions Weight Bearing Restrictions: No Other Position/Activity Restrictions: wbat     Mobility  Bed Mobility Overal bed mobility: Needs Assistance Bed Mobility: Supine to Sit     Supine to sit: Supervision     General bed mobility comments: Pt OOB in recliner at entry and exit    Transfers Overall transfer level: Needs assistance Equipment used: Rolling walker (2 wheels), Rollator (4  wheels) Transfers: Sit to/from Stand Sit to Stand: Supervision           General transfer comment: For safety only    Ambulation/Gait Ambulation/Gait assistance: Supervision Gait Distance (Feet): 150 Feet Assistive device: Rolling walker (2 wheels), Rollator (4 wheels) Gait Pattern/deviations: Step-to pattern, Decreased step length - left, Decreased stance time - left, Decreased weight shift to left Gait velocity: decreased     General Gait Details: Pt ambulated with RW and supervision, no physical assist or overt LOB noted.   Stairs Stairs: Yes Stairs assistance: Min guard Stair Management: Two rails, Step to pattern, Forwards Number of Stairs: 2 General stair comments: Pt demonstrated safe stair mobility with min guard and VCs for sequencing, no overt LOB noted or physical assist required   Wheelchair Mobility    Modified Rankin (Stroke Patients Only)       Balance Overall balance assessment: Needs assistance Sitting-balance support: Feet supported, No upper extremity supported Sitting balance-Leahy Scale: Good     Standing balance support: Reliant on assistive device for balance, During functional activity, Bilateral upper extremity supported Standing balance-Leahy Scale: Poor                              Cognition Arousal/Alertness: Awake/alert Behavior During Therapy: WFL for tasks assessed/performed Overall Cognitive Status: Within Functional Limits for tasks assessed                                          Exercises     General Comments General comments (skin integrity, edema, etc.): Dava Najjar  present at end of session, answered questions      Pertinent Vitals/Pain Pain Assessment Pain Assessment: 0-10 Pain Score: 6  Pain Location: left hip & thigh Pain Descriptors / Indicators: Operative site guarding, Sore Pain Intervention(s): Limited activity within patient's tolerance, Monitored during session,  Repositioned    Home Living Family/patient expects to be discharged to:: Private residence Living Arrangements: Spouse/significant other Available Help at Discharge: Family;Available 24 hours/day Type of Home: House Home Access: Stairs to enter Entrance Stairs-Rails: Right;Left;Can reach both Entrance Stairs-Number of Steps: 5   Home Layout: One level Home Equipment: Advice worker (2 wheels);Rollator (4 wheels)      Prior Function            PT Goals (current goals can now be found in the care plan section) Acute Rehab PT Goals Patient Stated Goal: To return to work PT Goal Formulation: With patient Time For Goal Achievement: 07/28/22 Potential to Achieve Goals: Good Progress towards PT goals: Goals met/education completed, patient discharged from PT    Frequency    7X/week      PT Plan Current plan remains appropriate    Co-evaluation              AM-PAC PT "6 Clicks" Mobility   Outcome Measure  Help needed turning from your back to your side while in a flat bed without using bedrails?: None Help needed moving from lying on your back to sitting on the side of a flat bed without using bedrails?: None Help needed moving to and from a bed to a chair (including a wheelchair)?: A Little Help needed standing up from a chair using your arms (e.g., wheelchair or bedside chair)?: A Little Help needed to walk in hospital room?: A Little Help needed climbing 3-5 steps with a railing? : A Little 6 Click Score: 20    End of Session Equipment Utilized During Treatment: Gait belt Activity Tolerance: Patient tolerated treatment well;No increased pain Patient left: with call bell/phone within reach;in bed;with family/visitor present;with nursing/sitter in room Nurse Communication: Mobility status PT Visit Diagnosis: Pain;Difficulty in walking, not elsewhere classified (R26.2) Pain - Right/Left: Left Pain - part of body: Hip     Time: 6015-6153 PT Time  Calculation (min) (ACUTE ONLY): 12 min  Charges:  $Gait Training: 8-22 mins                     Coolidge Breeze, PT, DPT [facility] Office: [phone] Pager: [phone]   Coolidge Breeze 07/21/2022, 1:09 PM

## 2022-07-21 NOTE — Discharge Summary (Signed)
Physician Discharge Summary  Patient ID: Tim Torres MRN: [id number] DOB/AGE: [id number] y.o.  Admit date: 07/20/2022 Discharge date: 07/21/2022  Admission Diagnoses: left hip OA  Discharge Diagnoses:  Principal Problem:   S/P total left hip arthroplasty   Discharged Condition: fair  Hospital Course: Patient underwent a left THA on 07/20/22 by Dr. Eulah Pont without complications. He spent the night in observation due to issues with pain control. He is now feeling better and has passed his PT evaluations. He is ready to discharge home.  Consults: None  Significant Diagnostic Studies: n/a  Treatments: IV hydration, antibiotics: Ancef, analgesia: acetaminophen, Dilaudid, and Oxycodone, anticoagulation: ASA, therapies: PT and OT, and surgery: left THA  Discharge Exam: Blood pressure 135/78, pulse 60, temperature (!) 97.5 F (36.4 C), resp. rate 18, height 6' (1.829 m), weight 109.3 kg, SpO2 98 %. General appearance: alert, cooperative, and no distress Head: Normocephalic, without obvious abnormality, atraumatic Resp: clear to auscultation bilaterally Cardio: regular rate and rhythm, S1, S2 normal, no murmur, click, rub or gallop Extremities: extremities normal, atraumatic, no cyanosis or edema Pulses:  L brachial 2+ R brachial 2+  L radial 2+ R radial 2+  L inguinal 2+ R inguinal 2+  L popliteal 2+ R popliteal 2+  L posterior tibial 2+ R posterior tibial 2+  L dorsalis pedis 2+ R dorsalis pedis 2+   Neurologic: Grossly normal Incision/Wound: c/d/i  Disposition: Discharge disposition: 01-Home or Self Care       Discharge Instructions     Call MD / Call 911   Complete by: As directed    If you experience chest pain or shortness of breath, CALL 911 and be transported to the hospital emergency room.  If you develope a fever above 101 F, pus (white drainage) or increased drainage or redness at the wound, or calf pain, call your surgeon's office.   Call MD / Call 911    Complete by: As directed    If you experience chest pain or shortness of breath, CALL 911 and be transported to the hospital emergency room.  If you develope a fever above 101 F, pus (white drainage) or increased drainage or redness at the wound, or calf pain, call your surgeon's office.   Diet - low sodium heart healthy   Complete by: As directed    Diet - low sodium heart healthy   Complete by: As directed    Discharge instructions   Complete by: As directed    You may bear weight as tolerated. Keep your dressing on and dry until follow up. Take medicine to prevent blood clots as directed. Take pain medicine as needed with the goal of transitioning to over the counter medicines.    INSTRUCTIONS AFTER JOINT REPLACEMENT   Remove items at home which could result in a fall. This includes throw rugs or furniture in walking pathways ICE to the affected joint every three hours while awake for 30 minutes at a time, for at least the first 3-5 days, and then as needed for pain and swelling.  Continue to use ice for pain and swelling. You may notice swelling that will progress down to the foot and ankle.  This is normal after surgery.  Elevate your leg when you are not up walking on it.   Continue to use the breathing machine you got in the hospital (incentive spirometer) which will help keep your temperature down.  It is common for your temperature to cycle up and down following surgery, especially  at night when you are not up moving around and exerting yourself.  The breathing machine keeps your lungs expanded and your temperature down.   DIET:  As you were doing prior to hospitalization, we recommend a well-balanced diet.  DRESSING / WOUND CARE / SHOWERING  You may shower 3 days after surgery, but keep the wounds dry during showering.  You may use an occlusive plastic wrap (Press'n Seal for example) with blue painter's tape at edges, NO SOAKING/SUBMERGING IN THE BATHTUB.  If the bandage gets wet,  call the office.   ACTIVITY  Increase activity slowly as tolerated, but follow the weight bearing instructions below.   No driving for 6 weeks or until further direction given by your physician.  You cannot drive while taking narcotics.  No lifting or carrying greater than 10 lbs. until further directed by your surgeon. Avoid periods of inactivity such as sitting longer than an hour when not asleep. This helps prevent blood clots.  You may return to work once you are authorized by your doctor.    WEIGHT BEARING   Weight bearing as tolerated with assist device (walker, cane, etc) as directed, use it as long as suggested by your surgeon or therapist, typically at least 4-6 weeks.   EXERCISES  Results after joint replacement surgery are often greatly improved when you follow the exercise, range of motion and muscle strengthening exercises prescribed by your doctor. Safety measures are also important to protect the joint from further injury. Any time any of these exercises cause you to have increased pain or swelling, decrease what you are doing until you are comfortable again and then slowly increase them. If you have problems or questions, call your caregiver or physical therapist for advice.   Rehabilitation is important following a joint replacement. After just a few days of immobilization, the muscles of the leg can become weakened and shrink (atrophy).  These exercises are designed to build up the tone and strength of the thigh and leg muscles and to improve motion. Often times heat used for twenty to thirty minutes before working out will loosen up your tissues and help with improving the range of motion but do not use heat for the first two weeks following surgery (sometimes heat can increase post-operative swelling).   These exercises can be done on a training (exercise) mat, on the floor, on a table or on a bed. Use whatever works the best and is most comfortable for you.    Use music or  television while you are exercising so that the exercises are a pleasant break in your day. This will make your life better with the exercises acting as a break in your routine that you can look forward to.   Perform all exercises about fifteen times, three times per day or as directed.  You should exercise both the operative leg and the other leg as well.  Exercises include:   Quad Sets - Tighten up the muscle on the front of the thigh (Quad) and hold for 5-10 seconds.   Straight Leg Raises - With your knee straight (if you were given a brace, keep it on), lift the leg to 60 degrees, hold for 3 seconds, and slowly lower the leg.  Perform this exercise against resistance later as your leg gets stronger.  Leg Slides: Lying on your back, slowly slide your foot toward your buttocks, bending your knee up off the floor (only go as far as is comfortable). Then slowly slide your  foot back down until your leg is flat on the floor again.  Angel Wings: Lying on your back spread your legs to the side as far apart as you can without causing discomfort.  Hamstring Strength:  Lying on your back, push your heel against the floor with your leg straight by tightening up the muscles of your buttocks.  Repeat, but this time bend your knee to a comfortable angle, and push your heel against the floor.  You may put a pillow under the heel to make it more comfortable if necessary.   A rehabilitation program following joint replacement surgery can speed recovery and prevent re-injury in the future due to weakened muscles. Contact your doctor or a physical therapist for more information on knee rehabilitation.    CONSTIPATION  Constipation is defined medically as fewer than three stools per week and severe constipation as less than one stool per week.  Even if you have a regular bowel pattern at home, your normal regimen is likely to be disrupted due to multiple reasons following surgery.  Combination of anesthesia,  postoperative narcotics, change in appetite and fluid intake all can affect your bowels.   YOU MUST use at least one of the following options; they are listed in order of increasing strength to get the job done.  They are all available over the counter, and you may need to use some, POSSIBLY even all of these options:    Drink plenty of fluids (prune juice may be helpful) and high fiber foods Colace 100 mg by mouth twice a day  Senokot for constipation as directed and as needed Dulcolax (bisacodyl), take with full glass of water  Miralax (polyethylene glycol) once or twice a day as needed.  If you have tried all these things and are unable to have a bowel movement in the first 3-4 days after surgery call either your surgeon or your primary doctor.    If you experience loose stools or diarrhea, hold the medications until you stool forms back up.  If your symptoms do not get better within 1 week or if they get worse, check with your doctor.  If you experience "the worst abdominal pain ever" or develop nausea or vomiting, please contact the office immediately for further recommendations for treatment.   ITCHING:  If you experience itching with your medications, try taking only a single pain pill, or even half a pain pill at a time.  You can also use Benadryl over the counter for itching or also to help with sleep.   TED HOSE STOCKINGS:  Use stockings on both legs until for at least 2 weeks or as directed by physician office. They may be removed at night for sleeping.  MEDICATIONS:  See your medication summary on the "After Visit Summary" that nursing will review with you.  You may have some home medications which will be placed on hold until you complete the course of blood thinner medication.  It is important for you to complete the blood thinner medication as prescribed.  Take medicines as prescribed.   You have several different medicines that work in different ways. - Tylenol is for mild to  moderate pain. Try to take this medicine before turning to your narcotic medicines.  - Gabapentin is to reduce pain / inflammation - Robaxin is for muscle spasms. This medicine can make you drowsy. - Oxycodone is a narcotic pain medicine.  Take this for severe pain. This medicine can be dehydrating / constipating. - Zofran  is for nausea and vomiting. - Senakot is for constipation prevention. - Aspirin is to prevent blood clots after surgery. YOU MUST TAKE THIS MEDICINE!!  PRECAUTIONS:  If you experience chest pain or shortness of breath - call 911 immediately for transfer to the hospital emergency department.   If you develop a fever greater that 101 F, purulent drainage from wound, increased redness or drainage from wound, foul odor from the wound/dressing, or calf pain - CONTACT YOUR SURGEON.                                                   FOLLOW-UP APPOINTMENTS:  If you do not already have a post-op appointment, please call the office [phone] for an appointment to be seen by Dr. Eulah Pont in 2 weeks.   OTHER INSTRUCTIONS:   MAKE SURE YOU:  Understand these instructions.  Get help right away if you are not doing well or get worse.    Thank you for letting us be a part of your medical care team.  It is a privilege we respect greatly.  We hope these instructions will help you stay on track for a fast and full recovery!   Discharge instructions   Complete by: As directed    You may bear weight as tolerated. Keep your dressing on and dry until follow up. Take medicine to prevent blood clots as directed. Take pain medicine as needed with the goal of transitioning to over the counter medicines.    INSTRUCTIONS AFTER JOINT REPLACEMENT   Remove items at home which could result in a fall. This includes throw rugs or furniture in walking pathways ICE to the affected joint every three hours while awake for 30 minutes at a time, for at least the first 3-5 days, and then as needed for pain  and swelling.  Continue to use ice for pain and swelling. You may notice swelling that will progress down to the foot and ankle.  This is normal after surgery.  Elevate your leg when you are not up walking on it.   Continue to use the breathing machine you got in the hospital (incentive spirometer) which will help keep your temperature down.  It is common for your temperature to cycle up and down following surgery, especially at night when you are not up moving around and exerting yourself.  The breathing machine keeps your lungs expanded and your temperature down.   DIET:  As you were doing prior to hospitalization, we recommend a well-balanced diet.  DRESSING / WOUND CARE / SHOWERING  You may shower 3 days after surgery, but keep the wounds dry during showering.  You may use an occlusive plastic wrap (Press'n Seal for example) with blue painter's tape at edges, NO SOAKING/SUBMERGING IN THE BATHTUB.  If the bandage gets wet, call the office.   ACTIVITY  Increase activity slowly as tolerated, but follow the weight bearing instructions below.   No driving for 6 weeks or until further direction given by your physician.  You cannot drive while taking narcotics.  No lifting or carrying greater than 10 lbs. until further directed by your surgeon. Avoid periods of inactivity such as sitting longer than an hour when not asleep. This helps prevent blood clots.  You may return to work once you are authorized by your doctor.    WEIGHT BEARING  Weight bearing as tolerated with assist device (walker, cane, etc) as directed, use it as long as suggested by your surgeon or therapist, typically at least 4-6 weeks.   EXERCISES  Results after joint replacement surgery are often greatly improved when you follow the exercise, range of motion and muscle strengthening exercises prescribed by your doctor. Safety measures are also important to protect the joint from further injury. Any time any of these exercises  cause you to have increased pain or swelling, decrease what you are doing until you are comfortable again and then slowly increase them. If you have problems or questions, call your caregiver or physical therapist for advice.   Rehabilitation is important following a joint replacement. After just a few days of immobilization, the muscles of the leg can become weakened and shrink (atrophy).  These exercises are designed to build up the tone and strength of the thigh and leg muscles and to improve motion. Often times heat used for twenty to thirty minutes before working out will loosen up your tissues and help with improving the range of motion but do not use heat for the first two weeks following surgery (sometimes heat can increase post-operative swelling).   These exercises can be done on a training (exercise) mat, on the floor, on a table or on a bed. Use whatever works the best and is most comfortable for you.    Use music or television while you are exercising so that the exercises are a pleasant break in your day. This will make your life better with the exercises acting as a break in your routine that you can look forward to.   Perform all exercises about fifteen times, three times per day or as directed.  You should exercise both the operative leg and the other leg as well.  Exercises include:   Quad Sets - Tighten up the muscle on the front of the thigh (Quad) and hold for 5-10 seconds.   Straight Leg Raises - With your knee straight (if you were given a brace, keep it on), lift the leg to 60 degrees, hold for 3 seconds, and slowly lower the leg.  Perform this exercise against resistance later as your leg gets stronger.  Leg Slides: Lying on your back, slowly slide your foot toward your buttocks, bending your knee up off the floor (only go as far as is comfortable). Then slowly slide your foot back down until your leg is flat on the floor again.  Angel Wings: Lying on your back spread your legs to  the side as far apart as you can without causing discomfort.  Hamstring Strength:  Lying on your back, push your heel against the floor with your leg straight by tightening up the muscles of your buttocks.  Repeat, but this time bend your knee to a comfortable angle, and push your heel against the floor.  You may put a pillow under the heel to make it more comfortable if necessary.   A rehabilitation program following joint replacement surgery can speed recovery and prevent re-injury in the future due to weakened muscles. Contact your doctor or a physical therapist for more information on knee rehabilitation.    CONSTIPATION  Constipation is defined medically as fewer than three stools per week and severe constipation as less than one stool per week.  Even if you have a regular bowel pattern at home, your normal regimen is likely to be disrupted due to multiple reasons following surgery.  Combination of anesthesia, postoperative  narcotics, change in appetite and fluid intake all can affect your bowels.   YOU MUST use at least one of the following options; they are listed in order of increasing strength to get the job done.  They are all available over the counter, and you may need to use some, POSSIBLY even all of these options:    Drink plenty of fluids (prune juice may be helpful) and high fiber foods Colace 100 mg by mouth twice a day  Senokot for constipation as directed and as needed Dulcolax (bisacodyl), take with full glass of water  Miralax (polyethylene glycol) once or twice a day as needed.  If you have tried all these things and are unable to have a bowel movement in the first 3-4 days after surgery call either your surgeon or your primary doctor.    If you experience loose stools or diarrhea, hold the medications until you stool forms back up.  If your symptoms do not get better within 1 week or if they get worse, check with your doctor.  If you experience "the worst abdominal pain  ever" or develop nausea or vomiting, please contact the office immediately for further recommendations for treatment.   ITCHING:  If you experience itching with your medications, try taking only a single pain pill, or even half a pain pill at a time.  You can also use Benadryl over the counter for itching or also to help with sleep.   TED HOSE STOCKINGS:  Use stockings on both legs until for at least 2 weeks or as directed by physician office. They may be removed at night for sleeping.  MEDICATIONS:  See your medication summary on the "After Visit Summary" that nursing will review with you.  You may have some home medications which will be placed on hold until you complete the course of blood thinner medication.  It is important for you to complete the blood thinner medication as prescribed.  Take medicines as prescribed.   You have several different medicines that work in different ways. - Tylenol is for mild to moderate pain. Try to take this medicine before turning to your narcotic medicines.  - Gabapentin is to reduce pain / inflammation - Robaxin is for muscle spasms. This medicine can make you drowsy. - Oxycodone is a narcotic pain medicine.  Take this for severe pain. This medicine can be dehydrating / constipating. - Zofran is for nausea and vomiting.  - Aspirin is to prevent blood clots after surgery. YOU MUST TAKE THIS MEDICINE!  PRECAUTIONS:  If you experience chest pain or shortness of breath - call 911 immediately for transfer to the hospital emergency department.   If you develop a fever greater that 101 F, purulent drainage from wound, increased redness or drainage from wound, foul odor from the wound/dressing, or calf pain - CONTACT YOUR SURGEON.                                                   FOLLOW-UP APPOINTMENTS:  If you do not already have a post-op appointment, please call the office [phone] for an appointment to be seen by Dr. Eulah Pont in 2 weeks.   OTHER  INSTRUCTIONS:   MAKE SURE YOU:  Understand these instructions.  Get help right away if you are not doing well or get worse.    Thank  you for letting us be a part of your medical care team.  It is a privilege we respect greatly.  We hope these instructions will help you stay on track for a fast and full recovery!   Driving restrictions   Complete by: As directed    No driving for 2-4 weeks   Driving restrictions   Complete by: As directed    No driving for 2-4 weeks   Post-operative opioid taper instructions:   Complete by: As directed    POST-OPERATIVE OPIOID TAPER INSTRUCTIONS: It is important to wean off of your opioid medication as soon as possible. If you do not need pain medication after your surgery it is ok to stop day one. Opioids include: Codeine, Hydrocodone(Norco, Vicodin), Oxycodone(Percocet, oxycontin) and hydromorphone amongst others.  Long term and even short term use of opiods can cause: Increased pain response Dependence Constipation Depression Respiratory depression And more.  Withdrawal symptoms can include Flu like symptoms Nausea, vomiting And more Techniques to manage these symptoms Hydrate well Eat regular healthy meals Stay active Use relaxation techniques(deep breathing, meditating, yoga) Do Not substitute Alcohol to help with tapering If you have been on opioids for less than two weeks and do not have pain than it is ok to stop all together.  Plan to wean off of opioids This plan should start within one week post op of your joint replacement. Maintain the same interval or time between taking each dose and first decrease the dose.  Cut the total daily intake of opioids by one tablet each day Next start to increase the time between doses. The last dose that should be eliminated is the evening dose.      Post-operative opioid taper instructions:   Complete by: As directed    POST-OPERATIVE OPIOID TAPER INSTRUCTIONS: It is important to wean off  of your opioid medication as soon as possible. If you do not need pain medication after your surgery it is ok to stop day one. Opioids include: Codeine, Hydrocodone(Norco, Vicodin), Oxycodone(Percocet, oxycontin) and hydromorphone amongst others.  Long term and even short term use of opiods can cause: Increased pain response Dependence Constipation Depression Respiratory depression And more.  Withdrawal symptoms can include Flu like symptoms Nausea, vomiting And more Techniques to manage these symptoms Hydrate well Eat regular healthy meals Stay active Use relaxation techniques(deep breathing, meditating, yoga) Do Not substitute Alcohol to help with tapering If you have been on opioids for less than two weeks and do not have pain than it is ok to stop all together.  Plan to wean off of opioids This plan should start within one week post op of your joint replacement. Maintain the same interval or time between taking each dose and first decrease the dose.  Cut the total daily intake of opioids by one tablet each day Next start to increase the time between doses. The last dose that should be eliminated is the evening dose.      TED hose   Complete by: As directed    Use stockings (TED hose) for 2 weeks on left leg(s).  You may remove them at night for sleeping.   TED hose   Complete by: As directed    Use stockings (TED hose) for 2 weeks on left leg(s).  You may remove them at night for sleeping.   Weight bearing as tolerated   Complete by: As directed    Laterality: left   Extremity: Lower   Weight bearing as tolerated   Complete  by: As directed       Allergies as of 07/21/2022       Reactions   Shellfish Allergy Shortness Of Breath, Swelling   Atorvastatin Other (See Comments)   Myalgia    Rosuvastatin Other (See Comments)   myalgia        Medication List     STOP taking these medications    ondansetron 4 MG tablet Commonly known as: ZOFRAN       TAKE  these medications    acetaminophen 500 MG tablet Commonly known as: TYLENOL Take 2 tablets (1,000 mg total) by mouth every 8 (eight) hours as needed for mild pain or moderate pain.   allopurinol 300 MG tablet Commonly known as: ZYLOPRIM Take 300 mg by mouth daily.   aspirin 325 MG tablet Take 325 mg by mouth daily.   cholecalciferol 25 MCG (1000 UNIT) tablet Commonly known as: VITAMIN D3 Take 1,000 Units by mouth daily. Take with 2000 unit vitamin d to equal 3000 units daily   Vitamin D 50 MCG (2000 UT) tablet Take 2,000 Units by mouth daily. Take with 1000 unit vitamin d to equal 3000 units daily   gabapentin 300 MG capsule Commonly known as: Neurontin Take 1 capsule (300 mg total) by mouth 2 (two) times daily for 14 days. For 2 weeks post op for pain.   HYDROcodone-acetaminophen 10-325 MG tablet Commonly known as: NORCO Take 1 tablet by mouth every 4 (four) hours as needed for moderate pain ((score 4 to 6)). What changed:  how much to take when to take this   lovastatin 40 MG tablet Commonly known as: MEVACOR Take 40 mg by mouth 2 (two) times daily.   Magnesium 500 MG Tabs Take 500 mg by mouth daily.   meclizine 25 MG tablet Commonly known as: ANTIVERT Take 1 tablet (25 mg total) by mouth 3 (three) times daily as needed for dizziness or nausea.   metFORMIN 500 MG 24 hr tablet Commonly known as: GLUCOPHAGE-XR Take 500-1,000 mg by mouth See admin instructions. Take 500 mg in the morning and 1000 mg in the evening   methocarbamol 750 MG tablet Commonly known as: Robaxin-750 Take 1 tablet (750 mg total) by mouth every 8 (eight) hours as needed for muscle spasms.   metoprolol tartrate 50 MG tablet Commonly known as: LOPRESSOR Take 50 mg by mouth 2 (two) times daily.   ondansetron 4 MG disintegrating tablet Commonly known as: ZOFRAN-ODT Take 1 tablet (4 mg total) by mouth 2 (two) times daily as needed for nausea or vomiting.   Oxycodone HCl 10 MG Tabs Take 0.5-1  tablets (5-10 mg total) by mouth every 8 (eight) hours as needed (for severe acute pain after surgery that is not resolved by your normal daily Vicodin).   senna-docusate 8.6-50 MG tablet Commonly known as: Senokot-S Take 1 tablet by mouth daily. To prevent constipation post-op   sildenafil 100 MG tablet Commonly known as: VIAGRA Take 100 mg by mouth as needed for erectile dysfunction.   zolpidem 5 MG tablet Commonly known as: AMBIEN Take 5 mg by mouth at bedtime as needed for sleep.               Discharge Care Instructions  (From admission, onward)           Start     Ordered   07/21/22 0000  Weight bearing as tolerated        07/21/22 1352   07/20/22 0000  Weight bearing as tolerated  Question Answer Comment  Laterality left   Extremity Lower      07/20/22 1116            Follow-up Information     Sheral Apley, MD. Go on 08/04/2022.   Specialty: Orthopedic Surgery Why: at 4:00pm Contact information: [address] [place] [address] [phone]                 Signed: Marzetta Board 07/21/2022, 1:52 PM

## 2022-07-21 NOTE — Progress Notes (Signed)
    Subjective: Patient reports pain as moderate. Much better today. Eye feels better too. Tolerating diet.  Urinating.   No CP, SOB.  Has not mobilized OOB with PT/OT yet.  Objective:   VITALS:   Vitals:   07/20/22 1954 07/20/22 2241 07/21/22 0145 07/21/22 0545  BP: (!) 143/75 126/86 135/71 (!) 151/82  Pulse: 73 66 63 60  Resp: 17 17 17 17   Temp: 98.6 F (37 C) 98.3 F (36.8 C) 97.9 F (36.6 C) 97.7 F (36.5 C)  TempSrc: Oral Oral Oral Oral  SpO2: 96% 96% 99% 99%  Weight:      Height:          Latest Ref Rng & Units 06/28/2022    9:18 AM 06/24/2020    9:54 AM 09/24/2019    2:54 AM  CBC  WBC 4.0 - 10.5 K/uL 5.4  6.2  8.5   Hemoglobin 13.0 - 17.0 g/dL 13/07/2019  05.3  97.6   Hematocrit 39.0 - 52.0 % 39.4  41.8  44.8   Platelets 150 - 400 K/uL 146  152  108       Latest Ref Rng & Units 07/20/2022   11:35 AM 06/28/2022    9:18 AM 06/24/2020    9:54 AM  BMP  Glucose 70 - 99 mg/dL 08/24/2020  193  790   BUN 8 - 23 mg/dL 29  37  18   Creatinine 0.61 - 1.24 mg/dL 240  9.73  5.32   Sodium 135 - 145 mmol/L 139  141  139   Potassium 3.5 - 5.1 mmol/L 4.6  4.9  4.6   Chloride 98 - 111 mmol/L 107  108  107   CO2 22 - 32 mmol/L 25  25  23    Calcium 8.9 - 10.3 mg/dL 9.4  9.8  9.6    Intake/Output      09/05 0701 09/06 0700 09/06 0701 09/07 0700   P.O. 120    I.V. (mL/kg) 1093.2 (10)    IV Piggyback 200    Total Intake(mL/kg) 1413.2 (12.9)    Urine (mL/kg/hr) 2500 (1)    Blood 375    Total Output 2875    Net -1461.8            Physical Exam: General: NAD.  Sitting up in bed, calm, comfortable Resp: No increased wob Cardio: regular rate and rhythm ABD soft Neurologically intact MSK Neurovascularly intact Sensation intact distally Intact pulses distally Dorsiflexion/Plantar flexion intact Incision: dressing C/D/I   Assessment: 1 Day Post-Op  S/P Procedure(s) (LRB): TOTAL HIP ARTHROPLASTY ANTERIOR APPROACH (Left) by Dr. 11/07. 2876 on 07/20/22  Principal Problem:    S/P total left hip arthroplasty   Plan: Eye drops for corneal abrasion  Advance diet Up with therapy Incentive Spirometry Elevate and Apply ice  Weightbearing: WBAT LLE Insicional and dressing care: Dressings left intact until follow-up and Reinforce dressings as needed Orthopedic device(s): None Showering: Keep dressing dry VTE prophylaxis:  ASA 325mg  daily   x 30 days post-op , SCDs, ambulation Pain control: continue current regimen Follow - up plan: 2 weeks Contact information:  Eulah Pont MD, 09/19/22 PA-C  Dispo: Home later today once passes PT eval    , Margarita Rana Office [phone] 07/21/2022, 9:45 AM

## 2022-07-21 NOTE — Progress Notes (Signed)
Discharge instructions given to patient. Patient verbalizes understanding.

## 2022-07-21 NOTE — TOC Transition Note (Signed)
Transition of Care [facility]) - CM/SW Discharge Note  Patient Details  Name: Tim Torres MRN: [id number] Date of Birth: 1951-09-22  Transition of Care [facility]) CM/SW Contact:  Sherie Don, LCSW Phone Number: 07/21/2022, 11:35 AM  Clinical Narrative: Patient is expected to discharge home after working with PT. CSW met with patient to review discharge plan. Patient will go home with OPPT at [facility]. Patient has a rolling walker and elevated toilets at home, so there are no DME needs at this time. TOC signing off.  Final next level of care: OP Rehab Barriers to Discharge: No Barriers Identified  Patient Goals and CMS Choice Patient states their goals for this hospitalization and ongoing recovery are:: Discharge home with OPPT at [facility] Choice offered to / list presented to : NA  Discharge Plan and Services        DME Arranged: N/A DME Agency: NA  Readmission Risk Interventions     No data to display

## 2022-07-21 NOTE — Evaluation (Signed)
Physical Therapy Evaluation Patient Details Name: Tim Torres MRN: [id number] DOB: 05-30-1951 Today's Date: 07/21/2022  History of Present Illness  pt is a 71yo male presenting s/p L-THA, AA on 07/20/22. PMH: DM, HTN, hx of MI.  Clinical Impression  Tim Torres is a 71 y.o. male POD1 s/p L-THA, AA. Patient reports independence with mobility at baseline. Patient is now limited by functional impairments (see PT problem list below) and requires supervision for bed mobility and min guard for transfers. Patient was able to ambulate 140 feet with RW and min guard level of assist. Patient instructed in exercise to facilitate ROM and circulation to manage edema. Provided incentive spirometer and with Vcs pt able to achieve 2073mL. Patient will benefit from continued skilled PT interventions to address impairments and progress towards PLOF. Pt will require second session today, anticipate pt will perform well and will meet mobility goals. Acute PT will follow to progress mobility and stair training in preparation for safe discharge home.       Recommendations for follow up therapy are one component of a multi-disciplinary discharge planning process, led by the attending physician.  Recommendations may be updated based on patient status, additional functional criteria and insurance authorization.  Follow Up Recommendations Follow physician's recommendations for discharge plan and follow up therapies      Assistance Recommended at Discharge Set up Supervision/Assistance  Patient can return home with the following  A little help with walking and/or transfers;A little help with bathing/dressing/bathroom;Assistance with cooking/housework;Assist for transportation;Help with stairs or ramp for entrance    Equipment Recommendations None recommended by PT  Recommendations for Other Services       Functional Status Assessment Patient has had a recent decline in their functional status and  demonstrates the ability to make significant improvements in function in a reasonable and predictable amount of time.     Precautions / Restrictions Precautions Precautions: Fall Restrictions Weight Bearing Restrictions: No Other Position/Activity Restrictions: wbat      Mobility  Bed Mobility Overal bed mobility: Needs Assistance Bed Mobility: Supine to Sit     Supine to sit: Supervision     General bed mobility comments: For safety only    Transfers Overall transfer level: Needs assistance Equipment used: Rolling walker (2 wheels), Rollator (4 wheels) Transfers: Sit to/from Stand Sit to Stand: Min guard, From elevated surface           General transfer comment: Pt completed sit to stand using rollator walker from elevated surface with min guard, brakes locked on walker.    Ambulation/Gait Ambulation/Gait assistance: Min guard Gait Distance (Feet): 140 Feet Assistive device: Rolling walker (2 wheels), Rollator (4 wheels) Gait Pattern/deviations: Step-to pattern, Decreased step length - left, Decreased stance time - left, Decreased weight shift to left Gait velocity: decreased     General Gait Details: Pt attempted ambulation first with rollator, then discontinued as pt found it to be too "squirrely," though continues to be adament that he will use the rollator at home. Completed remainder of ambulation task with RW without physical assist or overt LOB noted.  Stairs            Wheelchair Mobility    Modified Rankin (Stroke Patients Only)       Balance Overall balance assessment: Needs assistance Sitting-balance support: Feet supported, No upper extremity supported Sitting balance-Leahy Scale: Good     Standing balance support: Reliant on assistive device for balance, During functional activity, Bilateral upper extremity supported Standing balance-Leahy Scale:  Poor                               Pertinent Vitals/Pain Pain  Assessment Pain Assessment: 0-10 Pain Score: 5  Pain Location: left hip & thigh Pain Descriptors / Indicators: Operative site guarding, Sore Pain Intervention(s): Limited activity within patient's tolerance, Monitored during session, Repositioned, Ice applied    Home Living Family/patient expects to be discharged to:: Private residence Living Arrangements: Spouse/significant other Available Help at Discharge: Family;Available 24 hours/day Type of Home: House Home Access: Stairs to enter Entrance Stairs-Rails: Right;Left;Can reach both Entrance Stairs-Number of Steps: 5   Home Layout: One level Home Equipment: Pharmacist, hospital (2 wheels);Rollator (4 wheels)      Prior Function Prior Level of Function : Independent/Modified Independent;Driving;Working/employed (Remodeling decks)             Mobility Comments: SPC ADLs Comments: IND     Hand Dominance   Dominant Hand: Right    Extremity/Trunk Assessment   Upper Extremity Assessment Upper Extremity Assessment: Overall WFL for tasks assessed    Lower Extremity Assessment Lower Extremity Assessment: RLE deficits/detail;LLE deficits/detail RLE Deficits / Details: MMT ank DF/PF 4/5 RLE Sensation: WNL LLE Deficits / Details: MMT ank DF/PF 4/5, no extensor lag noted LLE Sensation: decreased light touch    Cervical / Trunk Assessment Cervical / Trunk Assessment: Normal  Communication   Communication: No difficulties  Cognition Arousal/Alertness: Awake/alert Behavior During Therapy: WFL for tasks assessed/performed Overall Cognitive Status: Within Functional Limits for tasks assessed                                          General Comments      Exercises Total Joint Exercises Ankle Circles/Pumps: AROM, Both, 10 reps Quad Sets: AROM, Left, 10 reps Short Arc Quad: Left, 10 reps, AAROM (with belt) Heel Slides: AROM, Left, 10 reps Hip ABduction/ADduction: AROM, Left, 10 reps    Assessment/Plan    PT Assessment Patient needs continued PT services  PT Problem List Decreased strength;Decreased range of motion;Decreased activity tolerance;Decreased balance;Decreased mobility;Decreased coordination;Pain       PT Treatment Interventions DME instruction;Gait training;Stair training;Functional mobility training;Therapeutic activities;Therapeutic exercise;Balance training;Neuromuscular re-education;Patient/family education    PT Goals (Current goals can be found in the Care Plan section)  Acute Rehab PT Goals Patient Stated Goal: To return to work PT Goal Formulation: With patient Time For Goal Achievement: 07/28/22 Potential to Achieve Goals: Good    Frequency 7X/week     Co-evaluation               AM-PAC PT "6 Clicks" Mobility  Outcome Measure Help needed turning from your back to your side while in a flat bed without using bedrails?: None Help needed moving from lying on your back to sitting on the side of a flat bed without using bedrails?: None Help needed moving to and from a bed to a chair (including a wheelchair)?: A Little Help needed standing up from a chair using your arms (e.g., wheelchair or bedside chair)?: A Little Help needed to walk in hospital room?: A Little Help needed climbing 3-5 steps with a railing? : A Little 6 Click Score: 20    End of Session Equipment Utilized During Treatment: Gait belt Activity Tolerance: Patient tolerated treatment well;No increased pain Patient left: in chair;with call bell/phone within  reach;with chair alarm set Nurse Communication: Mobility status PT Visit Diagnosis: Pain;Difficulty in walking, not elsewhere classified (R26.2) Pain - Right/Left: Left Pain - part of body: Hip    Time: 4680-3212 PT Time Calculation (min) (ACUTE ONLY): 31 min   Charges:   PT Evaluation $PT Eval Low Complexity: 1 Low PT Treatments $Gait Training: 8-22 mins        Jamesetta Geralds, PT, DPT [facility]  Rehabilitation Department Office: [phone] Pager: [phone]  Jamesetta Geralds 07/21/2022, 11:33 AM

## 2022-07-21 NOTE — Plan of Care (Signed)
Pt alert and oriented x 4. Pt sat on edge of bed and dangled this am. Used incentive spirometer and achieved 2000. Pt voiding without problem. Passing gas. Last bm 9/4. No prns given. Pt has only received scheduled pain meds this shift.  Problem: Education: Goal: Knowledge of the prescribed therapeutic regimen will improve Outcome: Progressing Goal: Understanding of discharge needs will improve Outcome: Progressing Goal: Individualized Educational Video(s) Outcome: Progressing   Problem: Activity: Goal: Ability to avoid complications of mobility impairment will improve Outcome: Progressing Goal: Ability to tolerate increased activity will improve Outcome: Progressing   Problem: Clinical Measurements: Goal: Postoperative complications will be avoided or minimized Outcome: Progressing   Problem: Pain Management: Goal: Pain level will decrease with appropriate interventions Outcome: Progressing   Problem: Skin Integrity: Goal: Will show signs of wound healing Outcome: Progressing   Problem: Education: Goal: Knowledge of General Education information will improve Description: Including pain rating scale, medication(s)/side effects and non-pharmacologic comfort measures Outcome: Progressing   Problem: Health Behavior/Discharge Planning: Goal: Ability to manage health-related needs will improve Outcome: Progressing   Problem: Clinical Measurements: Goal: Ability to maintain clinical measurements within normal limits will improve Outcome: Progressing Goal: Will remain free from infection Outcome: Progressing Goal: Diagnostic test results will improve Outcome: Progressing Goal: Respiratory complications will improve Outcome: Progressing Goal: Cardiovascular complication will be avoided Outcome: Progressing   Problem: Activity: Goal: Risk for activity intolerance will decrease Outcome: Progressing   Problem: Nutrition: Goal: Adequate nutrition will be maintained Outcome:  Progressing   Problem: Coping: Goal: Level of anxiety will decrease Outcome: Progressing   Problem: Elimination: Goal: Will not experience complications related to bowel motility Outcome: Progressing Goal: Will not experience complications related to urinary retention Outcome: Progressing   Problem: Pain Managment: Goal: General experience of comfort will improve Outcome: Progressing   Problem: Safety: Goal: Ability to remain free from injury will improve Outcome: Progressing   Problem: Skin Integrity: Goal: Risk for impaired skin integrity will decrease Outcome: Progressing

## 2022-07-23 ENCOUNTER — Ambulatory Visit: Payer: Medicare HMO | Attending: Orthopedic Surgery | Admitting: Physical Therapy

## 2022-07-23 ENCOUNTER — Other Ambulatory Visit: Payer: Self-pay

## 2022-07-23 ENCOUNTER — Encounter: Payer: Self-pay | Admitting: Physical Therapy

## 2022-07-23 DIAGNOSIS — M25651 Stiffness of right hip, not elsewhere classified: Secondary | ICD-10-CM | POA: Diagnosis present

## 2022-07-23 DIAGNOSIS — G8929 Other chronic pain: Secondary | ICD-10-CM | POA: Insufficient documentation

## 2022-07-23 DIAGNOSIS — M6281 Muscle weakness (generalized): Secondary | ICD-10-CM | POA: Insufficient documentation

## 2022-07-23 DIAGNOSIS — M25552 Pain in left hip: Secondary | ICD-10-CM | POA: Diagnosis present

## 2022-07-23 DIAGNOSIS — M545 Low back pain, unspecified: Secondary | ICD-10-CM | POA: Diagnosis present

## 2022-07-23 NOTE — Therapy (Signed)
OUTPATIENT PHYSICAL THERAPY LOWER EXTREMITY EVALUATION   Patient Name: Tim Torres MRN: [id number] DOB:03-28-1951, 71 y.o., male Today's Date: 07/23/2022   PT End of Session - 07/23/22 1249     Visit Number 1    Number of Visits 12    Date for PT Re-Evaluation 09/03/22    Authorization Type FOTO AT LEAST EVERY 5TH VISIT.  PROGRESS NOTE AT 10TH VISIT.  KX MODIFIER AFTER 15 VISITS.    PT Start Time 0815    PT Stop Time 0858    PT Time Calculation (min) 43 min             Past Medical History:  Diagnosis Date   Arthritis    COVID-19 10/21/2019   Diabetes mellitus without complication (HCC)    Hypertension    Myocardial infarction [facility]) 1997   per cardiology records, MI 11/16/96, s/p PTCS distal CX   Sleep apnea    per patient "does not wear CPAP, unable to tolerate"   Past Surgical History:  Procedure Laterality Date   ANGIOPLASTY     per patient "back in 1997"   BACK SURGERY     TOTAL HIP ARTHROPLASTY Left 07/20/2022   Procedure: TOTAL HIP ARTHROPLASTY ANTERIOR APPROACH;  Surgeon: Sheral Apley, MD;  Location: [facility] ORS;  Service: Orthopedics;  Laterality: Left;   Patient Active Problem List   Diagnosis Date Noted   S/P total left hip arthroplasty 07/20/2022   Degenerative spondylolisthesis 06/27/2020   DJD (degenerative joint disease) 09/24/2019   Dizziness 09/23/2019   HTN (hypertension) 09/23/2019   HLD (hyperlipidemia) 09/23/2019   CAD (coronary artery disease) 09/23/2019   DM2 (diabetes mellitus, type 2) (HCC) 09/23/2019     REFERRING PROVIDER: Margarita Rana MD  REFERRING DIAG: Pos-op left total hip replacement.  THERAPY DIAG:  Pain in left hip  Muscle weakness (generalized)  Stiffness of right hip, not elsewhere classified  Rationale for Evaluation and Treatment Rehabilitation  ONSET DATE: Surgery date:  07/24/22.  SUBJECTIVE:   SUBJECTIVE STATEMENT: The patient presents to the clinic s/p left anterior total hip replacement.  He states  yesterday, 07/22/22, he was getting in a truck and the seat was too close and forced his hip into to flexion which hurt a lot.  His pain today is a 6/10.  He presented to the clinic today walking safely with a FWW.  He has been compliant to a home exercise program.  Resting helps decrease his pain and movement increase his pain.  PERTINENT HISTORY: DM, lumbar surgery.  PAIN:  Are you having pain? Yes: NPRS scale: 6/10 Pain location: Left hip. Pain description: Ache, sore, throbbing, sharp, numb. Aggravating factors: See above. Relieving factors: See above.  PRECAUTIONS: Anterior hip.  No ultrasound.  WEIGHT BEARING RESTRICTIONS No  FALLS:  Has patient fallen in last 6 months? No  LIVING ENVIRONMENT: Lives in: House/apartment Has following equipment at home: Walker - 2 wheeled  PLOF: Independent  PATIENT GOALS Get around without left hip pain.   OBJECTIVE:  PATIENT SURVEYS:  FOTO Complete.  OBSERVATION:  Post-surgical dressing intact over left hip.  Ecchymosis noted.  PALPATION:  Very tender to palpation below post-surgical dressing over left lateral hip/thigh region.  LOWER EXTREMITY ROM:  Patient achieved full left knee extension.  Gentle PAROM into left hip flexion to 70 degrees.  LOWER EXTREMITY MMT:  Patient able to perform a left SAQ (quad atrophy noted).  Patient able to perform left hip abduction movement in supine (this is also part  of this HEP). GAIT: Antalgic gait with decreased step length with patient using a FWW.   TRANSFERS:  Sit to stand with use of armrests.  Sit to supine and supine to sit with manual assist required to his left LE.  TODAY'S TREATMENT: IFC at 80-150 Hz, 40% scan x 15 minutes to left hip region below post-surgical bandage.  Patient tolerated treatment well with normal modality response following removal of modality.  HOME EXERCISE PROGRAM: Performing HEP prescribed in hospital.  ASSESSMENT:  CLINICAL IMPRESSION: The patient  presents to OPPT s/p left anterior total hip replacement performed on 07/20/22.  He had a set back yesterday when he got into a truck with a close seat that moved his hip into flexion rapidly.  He presented to the clinic today using a FWW with an antalgic gait pattern.  He is very tender to palpation over his left hip musculature below his post-surgical dressing.  His left hip flexion was 70 degrees today assessed in supine.  Transitions from sit to supine and supine to sit require manual assist to his left LE.  He is compliant to his HEP.  Patient will benefit from skilled physical therapy intervention to address pain and deficits.    OBJECTIVE IMPAIRMENTS Abnormal gait, decreased activity tolerance, decreased mobility, difficulty walking, decreased ROM, decreased strength, and pain.   ACTIVITY LIMITATIONS locomotion level  PARTICIPATION LIMITATIONS: meal prep, cleaning, and laundry  REHAB POTENTIAL: Excellent  CLINICAL DECISION MAKING: Stable/uncomplicated  EVALUATION COMPLEXITY: Low   GOALS:  SHORT TERM GOALS: Target date: 08/06/2022  Ind with an initial HEP. Baseline: Goal status: INITIAL   LONG TERM GOALS: Target date: 09/03/2022   Ind with an advanced HEP. Baseline:  Goal status: INITIAL  2.  Walk without assist device 500 feet. Baseline:  Goal status: INITIAL  3.  Perform a reciprocating stair gait with one railing. Baseline:  Goal status: INITIAL  4.  Increase left LE strength to 5/5 to increase stability for functional tasks. Baseline:  Goal status: INITIAL  5.  Perform ADL's with pain not > 2-3/10. Baseline:  Goal status: INITIAL   PLAN: PT FREQUENCY: 2x/week  PT DURATION: 6 weeks  PLANNED INTERVENTIONS: Therapeutic exercises, Therapeutic activity, Neuromuscular re-education, Gait training, Patient/Family education, Self Care, Electrical stimulation, Cryotherapy, Moist heat, and Manual therapy  PLAN FOR NEXT SESSION: Nustep, left LE strengthening, gait  training and activities.  Electrical stimulation for pain control if needed.   Chistina Roston, [place], PT 07/23/2022, 12:49 PM

## 2022-07-26 ENCOUNTER — Ambulatory Visit: Payer: Medicare HMO

## 2022-07-26 DIAGNOSIS — M25552 Pain in left hip: Secondary | ICD-10-CM

## 2022-07-26 DIAGNOSIS — M6281 Muscle weakness (generalized): Secondary | ICD-10-CM

## 2022-07-26 NOTE — Therapy (Signed)
OUTPATIENT PHYSICAL THERAPY LOWER EXTREMITY EVALUATION   Patient Name: Tim Torres MRN: [id number] DOB:[birth year], 71 y.o., male Today's Date: 07/26/2022   PT End of Session - 07/26/22 0819     Visit Number 2    Number of Visits 12    Date for PT Re-Evaluation 09/03/22    Authorization Type FOTO AT LEAST EVERY 5TH VISIT.  PROGRESS NOTE AT 10TH VISIT.  KX MODIFIER AFTER 15 VISITS.    PT Start Time 0815    PT Stop Time 0909    PT Time Calculation (min) 54 min             Past Medical History:  Diagnosis Date   Arthritis    COVID-19 10/21/2019   Diabetes mellitus without complication (HCC)    Hypertension    Myocardial infarction [facility]) 1997   per cardiology records, [place] 11/16/96, s/p PTCS distal CX   Sleep apnea    per patient "does not wear CPAP, unable to tolerate"   Past Surgical History:  Procedure Laterality Date   ANGIOPLASTY     per patient "back in 1997"   BACK SURGERY     TOTAL HIP ARTHROPLASTY Left 07/20/2022   Procedure: TOTAL HIP ARTHROPLASTY ANTERIOR APPROACH;  Surgeon: Sheral Apley, MD;  Location: [facility];  Service: Orthopedics;  Laterality: Left;   Patient Active Problem List   Diagnosis Date Noted   S/P total left hip arthroplasty 07/20/2022   Degenerative spondylolisthesis 06/27/2020   DJD (degenerative joint disease) 09/24/2019   Dizziness 09/23/2019   HTN (hypertension) 09/23/2019   HLD (hyperlipidemia) 09/23/2019   CAD (coronary artery disease) 09/23/2019   DM2 (diabetes mellitus, type 2) (HCC) 09/23/2019     REFERRING PROVIDER: Margarita Rana MD  REFERRING DIAG: Pos-op left total hip replacement.  THERAPY DIAG:  Pain in left hip  Muscle weakness (generalized)  Rationale for Evaluation and Treatment Rehabilitation  ONSET DATE: Surgery date:  07/24/22.  SUBJECTIVE:   SUBJECTIVE STATEMENT: Pt reports left hip pain and soreness today.   PERTINENT HISTORY: DM, lumbar surgery.  PAIN:  Are you having pain? Yes: NPRS scale:  5/10 Pain location: Left hip. Pain description: Ache, sore, throbbing, sharp, numb. Aggravating factors: See above. Relieving factors: See above.  PRECAUTIONS: Anterior hip.  No ultrasound.  WEIGHT BEARING RESTRICTIONS No  FALLS:  Has patient fallen in last 6 months? No  LIVING ENVIRONMENT: Lives in: House/apartment Has following equipment at home: Walker - 2 wheeled  PLOF: Independent  PATIENT GOALS Get around without left hip pain.   OBJECTIVE:  PATIENT SURVEYS:  FOTO Complete.  OBSERVATION:  Post-surgical dressing intact over left hip.  Ecchymosis noted.  PALPATION:  Very tender to palpation below post-surgical dressing over left lateral hip/thigh region.  LOWER EXTREMITY ROM:  Patient achieved full left knee extension.  Gentle PAROM into left hip flexion to 70 degrees.  LOWER EXTREMITY MMT:  Patient able to perform a left SAQ (quad atrophy noted).  Patient able to perform left hip abduction movement in supine (this is also part of this HEP). GAIT: Antalgic gait with decreased step length with patient using a FWW.   TRANSFERS:  Sit to stand with use of armrests.  Sit to supine and supine to sit with manual assist required to his left LE.  TODAY'S TREATMENT:  EXERCISE LOG  Exercise Repetitions and Resistance Comments  Nustep Lvl 2 x 15 mins   LAQ 20 reps   Heel Slides 20 reps   SAQ 20 reps   SLR Unable to perform        Blank cell = exercise not performed today   Manual Therapy Soft Tissue Mobilization: left quad, STW/M to left quad and IT band to decrease pain and tone with pt positioned in supine    Modalities  Date:  Unattended Estim: Hip, IFC 80-150 Hz, 15 mins, Pain and Tone Hot Pack: Hip, 15 mins, Pain and Tone   HOME EXERCISE PROGRAM: Performing HEP prescribed in hospital.  ASSESSMENT:  CLINICAL IMPRESSION: Pt arrives for today's treatment session reporting 5/10 left hip pain.  Pt reports performing  HEP at home as able due to pain.  STW/M performed to left quad and IT band to decrease pain and tone with pt positioned in supine with BLE on wedge.  Noted tenderness to palpation of proximal IT band.  Normal responses to estim and MH noted.  Pt reported 4/10 left hip pain at completion of today's treatment session.    OBJECTIVE IMPAIRMENTS Abnormal gait, decreased activity tolerance, decreased mobility, difficulty walking, decreased ROM, decreased strength, and pain.   ACTIVITY LIMITATIONS locomotion level  PARTICIPATION LIMITATIONS: meal prep, cleaning, and laundry  REHAB POTENTIAL: Excellent  CLINICAL DECISION MAKING: Stable/uncomplicated  EVALUATION COMPLEXITY: Low   GOALS:  SHORT TERM GOALS: Target date: 08/09/2022  Ind with an initial HEP. Baseline: Goal status: INITIAL   LONG TERM GOALS: Target date: 09/06/2022   Ind with an advanced HEP. Baseline:  Goal status: INITIAL  2.  Walk without assist device 500 feet. Baseline:  Goal status: INITIAL  3.  Perform a reciprocating stair gait with one railing. Baseline:  Goal status: INITIAL  4.  Increase left LE strength to 5/5 to increase stability for functional tasks. Baseline:  Goal status: INITIAL  5.  Perform ADL's with pain not > 2-3/10. Baseline:  Goal status: INITIAL   PLAN: PT FREQUENCY: 2x/week  PT DURATION: 6 weeks  PLANNED INTERVENTIONS: Therapeutic exercises, Therapeutic activity, Neuromuscular re-education, Gait training, Patient/Family education, Self Care, Electrical stimulation, Cryotherapy, Moist heat, and Manual therapy  PLAN FOR NEXT SESSION: Nustep, left LE strengthening, gait training and activities.  Electrical stimulation for pain control if needed.   Newman Pies, PTA 07/26/2022, 9:13 AM

## 2022-07-29 ENCOUNTER — Ambulatory Visit: Payer: Medicare HMO | Admitting: *Deleted

## 2022-07-29 ENCOUNTER — Encounter: Payer: Self-pay | Admitting: *Deleted

## 2022-07-29 DIAGNOSIS — M6281 Muscle weakness (generalized): Secondary | ICD-10-CM

## 2022-07-29 DIAGNOSIS — G8929 Other chronic pain: Secondary | ICD-10-CM

## 2022-07-29 DIAGNOSIS — M25651 Stiffness of right hip, not elsewhere classified: Secondary | ICD-10-CM

## 2022-07-29 DIAGNOSIS — M25552 Pain in left hip: Secondary | ICD-10-CM | POA: Diagnosis not present

## 2022-07-29 NOTE — Therapy (Signed)
OUTPATIENT PHYSICAL THERAPY LOWER EXTREMITY TREATMENT   Patient Name: Tim Torres MRN: [id number] DOB:1951-01-07, 71 y.o., male Today's Date: 07/29/2022   PT End of Session - 07/29/22 0912     Visit Number 3    Number of Visits 12    Date for PT Re-Evaluation 09/03/22    Authorization Type FOTO AT LEAST EVERY 5TH VISIT.  PROGRESS NOTE AT 10TH VISIT.  KX MODIFIER AFTER 15 VISITS.    PT Start Time 0900    PT Stop Time 0951    PT Time Calculation (min) 51 min             Past Medical History:  Diagnosis Date   Arthritis    COVID-19 10/21/2019   Diabetes mellitus without complication (HCC)    Hypertension    Myocardial infarction [facility]) 1997   per cardiology records, [place] 11/16/96, s/p PTCS distal CX   Sleep apnea    per patient "does not wear CPAP, unable to tolerate"   Past Surgical History:  Procedure Laterality Date   ANGIOPLASTY     per patient "back in 1997"   BACK SURGERY     TOTAL HIP ARTHROPLASTY Left 07/20/2022   Procedure: TOTAL HIP ARTHROPLASTY ANTERIOR APPROACH;  Surgeon: Sheral Apley, MD;  Location: [facility];  Service: Orthopedics;  Laterality: Left;   Patient Active Problem List   Diagnosis Date Noted   S/P total left hip arthroplasty 07/20/2022   Degenerative spondylolisthesis 06/27/2020   DJD (degenerative joint disease) 09/24/2019   Dizziness 09/23/2019   HTN (hypertension) 09/23/2019   HLD (hyperlipidemia) 09/23/2019   CAD (coronary artery disease) 09/23/2019   DM2 (diabetes mellitus, type 2) (HCC) 09/23/2019     REFERRING PROVIDER: Margarita Rana MD  REFERRING DIAG: Pos-op left total hip replacement.  THERAPY DIAG:  Pain in left hip  Muscle weakness (generalized)  Stiffness of right hip, not elsewhere classified  Chronic right-sided low back pain without sciatica  Rationale for Evaluation and Treatment Rehabilitation  ONSET DATE: Surgery date:  07/24/22.  SUBJECTIVE:   SUBJECTIVE STATEMENT: Pt reports left hip pain and  soreness today. Did ok after last Rx, but sore.  PERTINENT HISTORY: DM, lumbar surgery.  PAIN:  Are you having pain? Yes: NPRS scale: 5-6/10 Pain location: Left hip. Pain description: Ache, sore, throbbing, sharp, numb. Aggravating factors: See above. Relieving factors: See above.  PRECAUTIONS: Anterior hip.  No ultrasound.  WEIGHT BEARING RESTRICTIONS No  FALLS:  Has patient fallen in last 6 months? No  LIVING ENVIRONMENT: Lives in: House/apartment Has following equipment at home: Walker - 2 wheeled  PLOF: Independent  PATIENT GOALS Get around without left hip pain.   OBJECTIVE:  PATIENT SURVEYS:  FOTO Complete.  OBSERVATION:  Post-surgical dressing intact over left hip.  Ecchymosis noted.  PALPATION:  Very tender to palpation below post-surgical dressing over left lateral hip/thigh region.  LOWER EXTREMITY ROM:  Patient achieved full left knee extension.  Gentle PAROM into left hip flexion to 70 degrees.  LOWER EXTREMITY MMT:  Patient able to perform a left SAQ (quad atrophy noted).  Patient able to perform left hip abduction movement in supine (this is also part of this HEP). GAIT: Antalgic gait with decreased step length with patient using a FWW.   TRANSFERS:  Sit to stand with use of armrests.  Sit to supine and supine to sit with manual assist required to his left LE.  TODAY'S TREATMENT:  EXERCISE LOG   07-29-22  Exercise Repetitions and Resistance Comments  Nustep Lvl 2 x 15 mins   LAQ 2x10 reps pause 5 sec top   Heel Slides 2x10  reps on heel slides   SAQ 2 x 10 reps hold 5 secs            Blank cell = exercise not performed today   Manual Therapy Soft Tissue Mobilization: left quad, STW/M to left quad and IT band to decrease pain and tone with pt positioned in supine    Modalities  Date:  Unattended Estim: Hip, IFC 80-150 Hz, 15 mins, Pain and Tone Hot Pack: Hip, 15 mins, Pain and Tone   HOME EXERCISE  PROGRAM: Performing HEP prescribed in hospital.  ASSESSMENT:  CLINICAL IMPRESSION:  Pt arrived today doing fairly well with LT hip, but very sore. He was able to perform  the nustep as well as some sitting and supine exs and did well. Try standing act's next session.   OBJECTIVE IMPAIRMENTS Abnormal gait, decreased activity tolerance, decreased mobility, difficulty walking, decreased ROM, decreased strength, and pain.   ACTIVITY LIMITATIONS locomotion level  PARTICIPATION LIMITATIONS: meal prep, cleaning, and laundry  REHAB POTENTIAL: Excellent  CLINICAL DECISION MAKING: Stable/uncomplicated  EVALUATION COMPLEXITY: Low   GOALS:  SHORT TERM GOALS: Target date: 08/12/2022  Ind with an initial HEP. Baseline: Goal status: INITIAL   LONG TERM GOALS: Target date: 09/09/2022   Ind with an advanced HEP. Baseline:  Goal status: INITIAL  2.  Walk without assist device 500 feet. Baseline:  Goal status: INITIAL  3.  Perform a reciprocating stair gait with one railing. Baseline:  Goal status: INITIAL  4.  Increase left LE strength to 5/5 to increase stability for functional tasks. Baseline:  Goal status: INITIAL  5.  Perform ADL's with pain not > 2-3/10. Baseline:  Goal status: INITIAL   PLAN: PT FREQUENCY: 2x/week  PT DURATION: 6 weeks  PLANNED INTERVENTIONS: Therapeutic exercises, Therapeutic activity, Neuromuscular re-education, Gait training, Patient/Family education, Self Care, Electrical stimulation, Cryotherapy, Moist heat, and Manual therapy  PLAN FOR NEXT SESSION: Nustep, left LE strengthening, gait training and activities.  Electrical stimulation for pain control if needed.   Rylynn Kobs,[name], PTA 07/29/2022, 10:02 AM

## 2022-08-02 ENCOUNTER — Ambulatory Visit: Payer: Medicare HMO | Admitting: Physical Therapy

## 2022-08-02 DIAGNOSIS — M6281 Muscle weakness (generalized): Secondary | ICD-10-CM

## 2022-08-02 DIAGNOSIS — M25552 Pain in left hip: Secondary | ICD-10-CM | POA: Diagnosis not present

## 2022-08-02 NOTE — Therapy (Signed)
OUTPATIENT PHYSICAL THERAPY LOWER EXTREMITY TREATMENT   Patient Name: Tim Torres MRN: [id number] DOB:[birth year], 71 y.o., male Today's Date: 08/02/2022   PT End of Session - 08/02/22 0929     Visit Number 4    Number of Visits 12    Date for PT Re-Evaluation 09/03/22    Authorization Type FOTO AT LEAST EVERY 5TH VISIT.  PROGRESS NOTE AT 10TH VISIT.  KX MODIFIER AFTER 15 VISITS.    PT Start Time 0900    PT Stop Time 0955    PT Time Calculation (min) 55 min    Activity Tolerance Patient tolerated treatment well    Behavior During Therapy WFL for tasks assessed/performed             Past Medical History:  Diagnosis Date   Arthritis    COVID-19 10/21/2019   Diabetes mellitus without complication (HCC)    Hypertension    My[facility] infarction [facility]) 1997   per cardiology records, [place] 11/16/96, s/p PTCS distal CX   Sleep apnea    per patient "does not wear CPAP, unable to tolerate"   Past Surgical History:  Procedure Laterality Date   ANGIOPLASTY     per patient "back in 1997"   BACK SURGERY     TOTAL HIP ARTHROPLASTY Left 07/20/2022   Procedure: TOTAL HIP ARTHROPLASTY ANTERIOR APPROACH;  Surgeon: Sheral Apley, MD;  Location: [facility];  Service: Orthopedics;  Laterality: Left;   Patient Active Problem List   Diagnosis Date Noted   S/P total left hip arthroplasty 07/20/2022   Degenerative spondylolisthesis 06/27/2020   DJD (degenerative joint disease) 09/24/2019   Dizziness 09/23/2019   HTN (hypertension) 09/23/2019   HLD (hyperlipidemia) 09/23/2019   CAD (coronary artery disease) 09/23/2019   DM2 (diabetes mellitus, type 2) (HCC) 09/23/2019     REFERRING PROVIDER: Margarita Rana MD  REFERRING DIAG: Post-op left total hip replacement.  THERAPY DIAG:  Pain in left hip  Muscle weakness (generalized)  Rationale for Evaluation and Treatment Rehabilitation  ONSET DATE: Surgery date:  07/24/22.  SUBJECTIVE:   SUBJECTIVE STATEMENT: Sore. PERTINENT  HISTORY: DM, lumbar surgery.  PAIN:  Are you having pain? Yes: NPRS scale: 5-6/10 Pain location: Left hip. Pain description: Ache, sore, throbbing, sharp, numb. Aggravating factors: See above. Relieving factors: See above.  PRECAUTIONS: Anterior hip.  No ultrasound.   PATIENT GOALS Get around without left hip pain.   OBJECTIVE:  TODAY'S TREATMENT:                                     EXERCISE LOG   08-02-22  Exercise Repetitions and Resistance Comments  Nustep Lvl 3 x 15 mins   SAQ's x 16 minutes facilitated with NMS for neuro re-education with 10 sec extension holds and 10 sec rest.  Manual Therapy Soft Tissue Mobilization: left quad, STW/M to left quad and IT band to decrease pain and tone with pt positioned in supine    Modalities:  IFC at 80-150 Hz on 40% scan x 15 minutes.      HOME EXERCISE PROGRAM: Performing HEP prescribed in hospital.  ASSESSMENT:  CLINICAL IMPRESSION:  Patient did well with NMS to his left quadriceps with very nice activation.    GOALS:  SHORT TERM GOALS: Target date: 08/16/2022  Ind with an initial HEP. Baseline: Goal status: INITIAL   LONG TERM GOALS: Target date: 09/13/2022   Ind with an advanced HEP.  Baseline:  Goal status: INITIAL  2.  Walk without assist device 500 feet. Baseline:  Goal status: INITIAL  3.  Perform a reciprocating stair gait with one railing. Baseline:  Goal status: INITIAL  4.  Increase left LE strength to 5/5 to increase stability for functional tasks. Baseline:  Goal status: INITIAL  5.  Perform ADL's with pain not > 2-3/10. Baseline:  Goal status: INITIAL   PLAN: PT FREQUENCY: 2x/week  PT DURATION: 6 weeks  PLANNED INTERVENTIONS: Therapeutic exercises, Therapeutic activity, Neuromuscular re-education, Gait training, Patient/Family education, Self Care, Electrical stimulation, Cryotherapy, Moist heat, and Manual therapy  PLAN FOR NEXT SESSION: Nustep, left LE strengthening, gait  training and activities.  Electrical stimulation for pain control if needed.   Denton Derks, [place], PT 08/02/2022, 10:16 AM

## 2022-08-05 ENCOUNTER — Ambulatory Visit: Payer: Medicare HMO | Admitting: *Deleted

## 2022-08-05 ENCOUNTER — Encounter: Payer: Self-pay | Admitting: *Deleted

## 2022-08-05 DIAGNOSIS — M6281 Muscle weakness (generalized): Secondary | ICD-10-CM

## 2022-08-05 DIAGNOSIS — M25552 Pain in left hip: Secondary | ICD-10-CM | POA: Diagnosis not present

## 2022-08-05 NOTE — Therapy (Signed)
OUTPATIENT PHYSICAL THERAPY LOWER EXTREMITY TREATMENT   Patient Name: Tim Torres MRN: [id number] DOB:1950-11-25, 71 y.o., male Today's Date: 08/05/2022   PT End of Session - 08/05/22 0957     Visit Number 5    Number of Visits 12    Date for PT Re-Evaluation 09/03/22    Authorization Type FOTO AT LEAST EVERY 5TH VISIT.  PROGRESS NOTE AT 10TH VISIT.  KX MODIFIER AFTER 15 VISITS.    PT Start Time 0945             Past Medical History:  Diagnosis Date   Arthritis    COVID-19 10/21/2019   Diabetes mellitus without complication (HCC)    Hypertension    Myocardial infarction [facility]) 1997   per cardiology records, [place] 11/16/96, s/p PTCS distal CX   Sleep apnea    per patient "does not wear CPAP, unable to tolerate"   Past Surgical History:  Procedure Laterality Date   ANGIOPLASTY     per patient "back in 1997"   BACK SURGERY     TOTAL HIP ARTHROPLASTY Left 07/20/2022   Procedure: TOTAL HIP ARTHROPLASTY ANTERIOR APPROACH;  Surgeon: Sheral Apley, MD;  Location: [facility];  Service: Orthopedics;  Laterality: Left;   Patient Active Problem List   Diagnosis Date Noted   S/P total left hip arthroplasty 07/20/2022   Degenerative spondylolisthesis 06/27/2020   DJD (degenerative joint disease) 09/24/2019   Dizziness 09/23/2019   HTN (hypertension) 09/23/2019   HLD (hyperlipidemia) 09/23/2019   CAD (coronary artery disease) 09/23/2019   DM2 (diabetes mellitus, type 2) (HCC) 09/23/2019     REFERRING PROVIDER: Margarita Rana MD  REFERRING DIAG: Post-op left total hip replacement.  THERAPY DIAG:  Pain in left hip  Muscle weakness (generalized)  Rationale for Evaluation and Treatment Rehabilitation  ONSET DATE: Surgery date:  07/24/22.  SUBJECTIVE:   SUBJECTIVE STATEMENT: Sore. PERTINENT HISTORY: DM, lumbar surgery.  PAIN:  Are you having pain? Yes: NPRS scale: 5-6/10 Pain location: Left hip. Pain description: Ache, sore, throbbing, sharp, numb. Aggravating  factors: See above. Relieving factors: See above.  PRECAUTIONS: Anterior hip.  No ultrasound.   PATIENT GOALS Get around without left hip pain.   OBJECTIVE:  TODAY'S TREATMENT:                                     EXERCISE LOG   08-05-22             (08-03-22 2 weeks post-op)    Exercise Repetitions and Resistance Comments  Nustep Lvl 3 x 15 mins   Rocker board 3 mins   Heel ups/toe ups X10 each   Standing hip abd X10  LT LE   Hooklying add with ball X15 hold 5secs   Hooklying clam red band X 15 hold 5 secs   SAQ x20     Manual Therapy Soft Tissue Mobilization: left quad, STW/M to left quad and IT band to decrease pain and tone with pt positioned in supine    Modalities:  IFC at 80-150 Hz on 40% scan x 15 minutes.LT hip      HOME EXERCISE PROGRAM: Performing HEP prescribed in hospital.  ASSESSMENT:  CLINICAL IMPRESSION:  Patient arrived today doing fair, but some increased soreness in LT hip. He reports f/u with MD yesterday and MD was pleased. Pt able to progress  with standing and hooklying exs today.    GOALS:  SHORT TERM GOALS: Target date: 08/19/2022  Ind with an initial HEP. Baseline: Goal status: INITIAL   LONG TERM GOALS: Target date: 09/16/2022   Ind with an advanced HEP. Baseline:  Goal status: INITIAL  2.  Walk without assist device 500 feet. Baseline:  Goal status: INITIAL  3.  Perform a reciprocating stair gait with one railing. Baseline:  Goal status: INITIAL  4.  Increase left LE strength to 5/5 to increase stability for functional tasks. Baseline:  Goal status: INITIAL  5.  Perform ADL's with pain not > 2-3/10. Baseline:  Goal status: INITIAL   PLAN: PT FREQUENCY: 2x/week  PT DURATION: 6 weeks  PLANNED INTERVENTIONS: Therapeutic exercises, Therapeutic activity, Neuromuscular re-education, Gait training, Patient/Family education, Self Care, Electrical stimulation, Cryotherapy, Moist heat, and Manual therapy  PLAN FOR NEXT  SESSION: Nustep, left LE strengthening, gait training and activities.  Electrical stimulation for pain control if needed.   Akili Corsetti,[name], PTA 08/05/2022, 12:07 PM

## 2022-08-10 ENCOUNTER — Ambulatory Visit: Payer: Medicare HMO

## 2022-08-10 DIAGNOSIS — M25552 Pain in left hip: Secondary | ICD-10-CM | POA: Diagnosis not present

## 2022-08-10 DIAGNOSIS — M6281 Muscle weakness (generalized): Secondary | ICD-10-CM

## 2022-08-10 NOTE — Therapy (Signed)
OUTPATIENT PHYSICAL THERAPY LOWER EXTREMITY TREATMENT   Patient Name: Tim Torres MRN: [id number] DOB:1951-11-13, 71 y.o., male Today's Date: 08/10/2022   PT End of Session - 08/10/22 1437     Visit Number 6    Number of Visits 12    Date for PT Re-Evaluation 09/03/22    Authorization Type FOTO AT LEAST EVERY 5TH VISIT.  PROGRESS NOTE AT 10TH VISIT.  KX MODIFIER AFTER 15 VISITS.    PT Start Time 1430    PT Stop Time 1525    PT Time Calculation (min) 55 min    Activity Tolerance Patient tolerated treatment well    Behavior During Therapy WFL for tasks assessed/performed              Past Medical History:  Diagnosis Date   Arthritis    COVID-19 10/21/2019   Diabetes mellitus without complication (HCC)    Hypertension    Myocardial infarction [facility]) 1997   per cardiology records, [place] 11/16/96, s/p PTCS distal CX   Sleep apnea    per patient "does not wear CPAP, unable to tolerate"   Past Surgical History:  Procedure Laterality Date   ANGIOPLASTY     per patient "back in 1997"   BACK SURGERY     TOTAL HIP ARTHROPLASTY Left 07/20/2022   Procedure: TOTAL HIP ARTHROPLASTY ANTERIOR APPROACH;  Surgeon: Sheral Apley, MD;  Location: [facility];  Service: Orthopedics;  Laterality: Left;   Patient Active Problem List   Diagnosis Date Noted   S/P total left hip arthroplasty 07/20/2022   Degenerative spondylolisthesis 06/27/2020   DJD (degenerative joint disease) 09/24/2019   Dizziness 09/23/2019   HTN (hypertension) 09/23/2019   HLD (hyperlipidemia) 09/23/2019   CAD (coronary artery disease) 09/23/2019   DM2 (diabetes mellitus, type 2) (HCC) 09/23/2019     REFERRING PROVIDER: Margarita Rana MD  REFERRING DIAG: Post-op left total hip replacement.  THERAPY DIAG:  Pain in left hip  Muscle weakness (generalized)  Rationale for Evaluation and Treatment Rehabilitation  ONSET DATE: Surgery date:  07/24/22.  SUBJECTIVE:   SUBJECTIVE STATEMENT: Patient reports that  his hip is still sore and swollen. He notes that he has tried walking more without his walker, but he is only able to go short distances.  PERTINENT HISTORY: DM, lumbar surgery.  PAIN:  Are you having pain? Yes: NPRS scale: 5-6/10 Pain location: Left hip. Pain description: Ache, sore, throbbing, sharp, numb. Aggravating factors: See above. Relieving factors: See above.  PRECAUTIONS: Anterior hip.  No ultrasound.   PATIENT GOALS Get around without left hip pain.   OBJECTIVE:  TODAY'S TREATMENT:                                   9/26 EXERCISE LOG  Exercise Repetitions and Resistance Comments  Nustep L3 x 15 minutes   Marching on foam pad 2 minutes 1 hand support with intermittent BUE support  Rocker board 3 minutes   Lunges onto step 6" step; 20 reps   LAQ  2 minutes    Blank cell = exercise not performed today  Manual Therapy Soft Tissue Mobilization: Left IT band, for reduced pain and soreness                                       EXERCISE LOG   08-05-22             (  08-03-22 2 weeks post-op)    Exercise Repetitions and Resistance Comments  Nustep Lvl 3 x 15 mins   Rocker board 3 mins   Heel ups/toe ups X10 each   Standing hip abd X10  LT LE   Hooklying add with ball X15 hold 5secs   Hooklying clam red band X 15 hold 5 secs   SAQ x20     Manual Therapy Soft Tissue Mobilization: left quad, STW/M to left quad and IT band to decrease pain and tone with pt positioned in supine    Modalities:  IFC at 80-150 Hz on 40% scan x 15 minutes.LT hip      HOME EXERCISE PROGRAM: Performing HEP prescribed in hospital.  ASSESSMENT:  CLINICAL IMPRESSION:  Treatment focused on familiar interventions for improved left lower extremity mobility and strength needed for improved functional mobility. He required minimal cueing with long arc quads for improved knee extension to facilitate quadriceps engagement. Manual therapy focused on soft tissue mobilization to the left iliotibial  band as this was the most effective at reducing his familiar soreness. He reported that his hip felt a little better upon the conclusion of treatment. He continues to require skilled physical therapy to address his remaining impairments to return to his prior level of function.     GOALS:  SHORT TERM GOALS: Target date: 08/24/2022  Ind with an initial HEP. Baseline: Goal status: INITIAL   LONG TERM GOALS: Target date: 09/21/2022   Ind with an advanced HEP. Baseline:  Goal status: INITIAL  2.  Walk without assist device 500 feet. Baseline:  Goal status: INITIAL  3.  Perform a reciprocating stair gait with one railing. Baseline:  Goal status: INITIAL  4.  Increase left LE strength to 5/5 to increase stability for functional tasks. Baseline:  Goal status: INITIAL  5.  Perform ADL's with pain not > 2-3/10. Baseline:  Goal status: INITIAL   PLAN: PT FREQUENCY: 2x/week  PT DURATION: 6 weeks  PLANNED INTERVENTIONS: Therapeutic exercises, Therapeutic activity, Neuromuscular re-education, Gait training, Patient/Family education, Self Care, Electrical stimulation, Cryotherapy, Moist heat, and Manual therapy  PLAN FOR NEXT SESSION: Nustep, left LE strengthening, gait training and activities.  Electrical stimulation for pain control if needed.   Darlin Coco, PT 08/10/2022, 3:50 PM

## 2022-08-12 ENCOUNTER — Ambulatory Visit: Payer: Medicare HMO

## 2022-08-12 DIAGNOSIS — M6281 Muscle weakness (generalized): Secondary | ICD-10-CM

## 2022-08-12 DIAGNOSIS — M25552 Pain in left hip: Secondary | ICD-10-CM | POA: Diagnosis not present

## 2022-08-12 NOTE — Therapy (Signed)
OUTPATIENT PHYSICAL THERAPY LOWER EXTREMITY TREATMENT   Patient Name: Tim Torres MRN: [id number] DOB:June 25, 1951, 71 y.o., male Today's Date: 08/12/2022   PT End of Session - 08/12/22 0948     Visit Number 7    Number of Visits 12    Date for PT Re-Evaluation 09/03/22    Authorization Type FOTO AT LEAST EVERY 5TH VISIT.  PROGRESS NOTE AT 10TH VISIT.  KX MODIFIER AFTER 15 VISITS.    PT Start Time 0945    PT Stop Time 1045    PT Time Calculation (min) 60 min    Activity Tolerance Patient tolerated treatment well    Behavior During Therapy WFL for tasks assessed/performed              Past Medical History:  Diagnosis Date   Arthritis    COVID-19 10/21/2019   Diabetes mellitus without complication ([place])    Hypertension    Myocardial infarction [facility]) 1997   per cardiology records, [place] 11/16/96, s/p PTCS distal CX   Sleep apnea    per patient "does not wear CPAP, unable to tolerate"   Past Surgical History:  Procedure Laterality Date   ANGIOPLASTY     per patient "back in 1997"   [place] Left 07/20/2022   Procedure: TOTAL HIP ARTHROPLASTY ANTERIOR APPROACH;  Surgeon: Renette Butters, MD;  Location: [facility];  Service: Orthopedics;  Laterality: Left;   Patient Active Problem List   Diagnosis Date Noted   S/P total left hip arthroplasty 07/20/2022   Degenerative spondylolisthesis 06/27/2020   DJD (degenerative joint disease) 09/24/2019   Dizziness 09/23/2019   HTN (hypertension) 09/23/2019   HLD (hyperlipidemia) 09/23/2019   CAD (coronary artery disease) 09/23/2019   DM2 (diabetes mellitus, type 2) ([place]) 09/23/2019     REFERRING PROVIDER: Edmonia Lynch MD  REFERRING DIAG: Post-op left total hip replacement.  THERAPY DIAG:  Pain in left hip  Muscle weakness (generalized)  Rationale for Evaluation and Treatment Rehabilitation  ONSET DATE: Surgery date:  07/24/22.  SUBJECTIVE:   SUBJECTIVE STATEMENT: Patient reports that  his hip is still sore right at the incision.  Pt reports being on his feet a lot yesterday.   PERTINENT HISTORY: DM, lumbar surgery.  PAIN:  Are you having pain? Yes: NPRS scale: 4/10 Pain location: Left hip. Pain description: Ache, sore, throbbing, sharp, numb. Aggravating factors: See above. Relieving factors: See above.  PRECAUTIONS: Anterior hip.  No ultrasound.   PATIENT GOALS Get around without left hip pain.   OBJECTIVE:  TODAY'S TREATMENT:                                                       9/28 EXERCISE LOG                 Exercise Repetitions and Resistance Comments  Nustep Lvl 5 x 15 mins   Rocker board 3 mins   Lunges 8" step x 25 reps   Standing hip abd X20  LT LE   Marching Airex x 3 mins   Seated clam red band X 15 hold 5 secs   LAQ x20   Ball Squeezes X2 mins     Manual Therapy Soft Tissue Mobilization: left quad, STW/M to left quad and IT band to decrease pain and tone  with pt positioned in supine    Modalities:  IFC at 80-150 Hz on 40% scan x 15 minutes.LT hip    HOME EXERCISE PROGRAM: Performing HEP prescribed in hospital.  ASSESSMENT:  CLINICAL IMPRESSION:  Pt arrives for today's treatment session reporting 4/10 left hip pain.  Pt reports that he was on his feet a lot yesterday and may have pushed himself a little too hard.  Pt able to tolerate increased time and reps with numerous exercises today.  Pt with increased pain reported with standing marches on Airex pad.  STW/M performed to left IT band to decrease pain and tone with noted tone throughout.  Normal responses to estim and MH noted upon removal.  Pt reported 3/10 left hip pain at completion of today's treatment session.    GOALS:  SHORT TERM GOALS: Target date: 08/24/2022  Ind with an initial HEP. Baseline: Goal status: INITIAL   LONG TERM GOALS: Target date: 09/21/2022   Ind with an advanced HEP. Baseline:  Goal status: INITIAL  2.  Walk without assist device 500  feet. Baseline:  Goal status: INITIAL  3.  Perform a reciprocating stair gait with one railing. Baseline:  Goal status: INITIAL  4.  Increase left LE strength to 5/5 to increase stability for functional tasks. Baseline:  Goal status: INITIAL  5.  Perform ADL's with pain not > 2-3/10. Baseline:  Goal status: INITIAL   PLAN: PT FREQUENCY: 2x/week  PT DURATION: 6 weeks  PLANNED INTERVENTIONS: Therapeutic exercises, Therapeutic activity, Neuromuscular re-education, Gait training, Patient/Family education, Self Care, Electrical stimulation, Cryotherapy, Moist heat, and Manual therapy  PLAN FOR NEXT SESSION: Nustep, left LE strengthening, gait training and activities.  Electrical stimulation for pain control if needed.   Newman Pies, PTA 08/12/2022, 10:46 AM

## 2022-08-17 ENCOUNTER — Ambulatory Visit: Payer: Medicare HMO | Attending: Orthopedic Surgery | Admitting: Physical Therapy

## 2022-08-17 ENCOUNTER — Encounter: Payer: Self-pay | Admitting: Physical Therapy

## 2022-08-17 DIAGNOSIS — M25552 Pain in left hip: Secondary | ICD-10-CM | POA: Insufficient documentation

## 2022-08-17 DIAGNOSIS — M6281 Muscle weakness (generalized): Secondary | ICD-10-CM | POA: Diagnosis present

## 2022-08-17 NOTE — Therapy (Signed)
OUTPATIENT PHYSICAL THERAPY LOWER EXTREMITY TREATMENT   Patient Name: Tim Torres MRN: [id number] DOB:1951-09-20, 71 y.o., male Today's Date: 08/17/2022   PT End of Session - 08/17/22 0933     Visit Number 8    Number of Visits 12    Date for PT Re-Evaluation 09/03/22    Authorization Type FOTO AT LEAST EVERY 5TH VISIT.  PROGRESS NOTE AT 10TH VISIT.  KX MODIFIER AFTER 15 VISITS.    PT Start Time [phone]    PT Stop Time 1034    PT Time Calculation (min) 48 min    Activity Tolerance Patient tolerated treatment well    Behavior During Therapy WFL for tasks assessed/performed              Past Medical History:  Diagnosis Date   Arthritis    COVID-19 10/21/2019   Diabetes mellitus without complication ([place])    Hypertension    Myocardial infarction [facility]) 1997   per cardiology records, [place] 11/16/96, s/p PTCS distal CX   Sleep apnea    per patient "does not wear CPAP, unable to tolerate"   Past Surgical History:  Procedure Laterality Date   ANGIOPLASTY     per patient "back in 1997"   [place] Left 07/20/2022   Procedure: TOTAL HIP ARTHROPLASTY ANTERIOR APPROACH;  Surgeon: Renette Butters, MD;  Location: [facility];  Service: Orthopedics;  Laterality: Left;   Patient Active Problem List   Diagnosis Date Noted   S/P total left hip arthroplasty 07/20/2022   Degenerative spondylolisthesis 06/27/2020   DJD (degenerative joint disease) 09/24/2019   Dizziness 09/23/2019   HTN (hypertension) 09/23/2019   HLD (hyperlipidemia) 09/23/2019   CAD (coronary artery disease) 09/23/2019   DM2 (diabetes mellitus, type 2) ([place]) 09/23/2019     REFERRING PROVIDER: Edmonia Lynch MD  REFERRING DIAG: Post-op left total hip replacement.  THERAPY DIAG:  Pain in left hip  Muscle weakness (generalized)  Rationale for Evaluation and Treatment Rehabilitation  ONSET DATE: Surgery date:  07/20/22.  SUBJECTIVE:   SUBJECTIVE STATEMENT: Did not sleep well  last night. Has soreness from starting to use SPC.  PERTINENT HISTORY: DM, lumbar surgery.  PAIN:  Are you having pain? Yes: NPRS scale: 6-7/10 Pain location: Left hip. Pain description: Ache, sore,  Aggravating factors: See above. Relieving factors: See above.  PRECAUTIONS: Anterior hip.  No ultrasound.   PATIENT GOALS Get around without left hip pain.  OBJECTIVE:  TODAY'S TREATMENT:                                                       10/3 EXERCISE LOG                 Exercise Repetitions and Resistance Comments  Nustep Lvl 5 x 15 mins, seat 12   Rocker board 3 mins   Lunges 8" step x 25 reps   Standing hip abd X20  LT LE   Hip flexion X15 reps   Forward step LLE x5 reps but stopped due to pain   LAQ X20 reps 3#    Modalities  Date: 08/17/22 Unattended Estim: Hip, IFC, 10 mins, Pain  HOME EXERCISE PROGRAM: Performing HEP prescribed in hospital.  ASSESSMENT:  CLINICAL IMPRESSION:  Patient presented in clinic with high level pain and  lack of sleep overnight due to pain. Patient continues to have swelling and large size area that also appears to have bruising around the incision. Patient states that he will do self massage to ITB, quad and to the inflammed area with no release of swelling to the inflammed area. Patient limited with hip flexion AROM and forward step ups due to proximal hip pain. Patient progressed himself to [facility] yesterday and demonstrates L hip abduction and lack of full hip flexion with SPC. Normal stimulation response noted following removal of the modality. Patient continues to use ice at home for pain and swelling.  GOALS:  SHORT TERM GOALS: Target date: 08/24/2022  Ind with an initial HEP. Baseline: Goal status: INITIAL   LONG TERM GOALS: Target date: 09/21/2022   Ind with an advanced HEP. Baseline:  Goal status: INITIAL  2.  Walk without assist device 500 feet. Baseline:  Goal status: INITIAL  3.  Perform a reciprocating stair gait  with one railing. Baseline:  Goal status: INITIAL  4.  Increase left LE strength to 5/5 to increase stability for functional tasks. Baseline:  Goal status: INITIAL  5.  Perform ADL's with pain not > 2-3/10. Baseline:  Goal status: INITIAL   PLAN: PT FREQUENCY: 2x/week  PT DURATION: 6 weeks  PLANNED INTERVENTIONS: Therapeutic exercises, Therapeutic activity, Neuromuscular re-education, Gait training, Patient/Family education, Self Care, Electrical stimulation, Cryotherapy, Moist heat, and Manual therapy  PLAN FOR NEXT SESSION: Nustep, left LE strengthening, gait training and activities.  Electrical stimulation for pain control if needed.   Marvell Fuller, PTA 08/17/2022, 10:43 AM

## 2022-08-19 ENCOUNTER — Ambulatory Visit: Payer: Medicare HMO

## 2022-08-19 DIAGNOSIS — M25552 Pain in left hip: Secondary | ICD-10-CM

## 2022-08-19 DIAGNOSIS — M6281 Muscle weakness (generalized): Secondary | ICD-10-CM

## 2022-08-19 NOTE — Therapy (Signed)
OUTPATIENT PHYSICAL THERAPY LOWER EXTREMITY TREATMENT   Patient Name: Tim Torres MRN: [id number] DOB:12-21-1950, 71 y.o., male Today's Date: 08/19/2022   PT End of Session - 08/19/22 1038     Visit Number 9    Number of Visits 12    Date for PT Re-Evaluation 09/03/22    Authorization Type FOTO AT LEAST EVERY 5TH VISIT.  PROGRESS NOTE AT 10TH VISIT.  KX MODIFIER AFTER 15 VISITS.    PT Start Time 1030    Activity Tolerance Patient tolerated treatment well    Behavior During Therapy [facility] for tasks assessed/performed              Past Medical History:  Diagnosis Date   Arthritis    COVID-19 10/21/2019   Diabetes mellitus without complication ([place])    Hypertension    Myocardial infarction [facility]) 1997   per cardiology records, [place] 11/16/96, s/p PTCS distal CX   Sleep apnea    per patient "does not wear CPAP, unable to tolerate"   Past Surgical History:  Procedure Laterality Date   ANGIOPLASTY     per patient "back in 1997"   [place] Left 07/20/2022   Procedure: TOTAL HIP ARTHROPLASTY ANTERIOR APPROACH;  Surgeon: Renette Butters, MD;  Location: [facility];  Service: Orthopedics;  Laterality: Left;   Patient Active Problem List   Diagnosis Date Noted   S/P total left hip arthroplasty 07/20/2022   Degenerative spondylolisthesis 06/27/2020   DJD (degenerative joint disease) 09/24/2019   Dizziness 09/23/2019   HTN (hypertension) 09/23/2019   HLD (hyperlipidemia) 09/23/2019   CAD (coronary artery disease) 09/23/2019   DM2 (diabetes mellitus, type 2) ([place]) 09/23/2019     REFERRING PROVIDER: Edmonia Lynch MD  REFERRING DIAG: Post-op left total hip replacement.  THERAPY DIAG:  Pain in left hip  Muscle weakness (generalized)  Rationale for Evaluation and Treatment Rehabilitation  ONSET DATE: Surgery date:  07/20/22.  SUBJECTIVE:   SUBJECTIVE STATEMENT: Pt arrives for today's treatment session reporting increased  soreness.  PERTINENT HISTORY: DM, lumbar surgery.  PAIN:  Are you having pain? Yes: NPRS scale: 6-7/10 Pain location: Left hip. Pain description: Ache, sore,  Aggravating factors: See above. Relieving factors: See above.  PRECAUTIONS: Anterior hip.  No ultrasound.   PATIENT GOALS Get around without left hip pain.  OBJECTIVE:  TODAY'S TREATMENT:                                                       10/5 EXERCISE LOG                 Exercise Repetitions and Resistance Comments  Nustep Lvl 6 x 15 mins, seat 12   Rocker board 4 mins   Lunges 8" step x 25 reps   Standing hip abd X20  LT LE   Hip flexion X20 reps   Hip extension X20 reps   Forward step    LAQ X30 reps 3#    Modalities  Date: 08/19/22 Unattended Estim: Hip, IFC, 10 mins, Pain  HOME EXERCISE PROGRAM: Performing HEP prescribed in hospital.  ASSESSMENT:  CLINICAL IMPRESSION:  Pt arrives for today's treatment session reporting 6-7/10 left hip pain. Pt reports having an MD appointment tomorrow to check on continued soreness.  Pt able to tolerate increased reps  with standing exercises today with slight increase in pain and soreness.  Normal responses to estim noted upon removal.  Pt reported 5/10 left hip pain at completion of today's treatment session.    GOALS:  SHORT TERM GOALS: Target date: 08/24/2022  Ind with an initial HEP. Baseline: Goal status: MET   LONG TERM GOALS: Target date: 09/21/2022   Ind with an advanced HEP. Baseline:  Goal status: IN PROGRESS  2.  Walk without assist device 500 feet. Baseline:  Goal status: IN PROGRESS  3.  Perform a reciprocating stair gait with one railing. Baseline:  Goal status: IN PROGRESS  4.  Increase left LE strength to 5/5 to increase stability for functional tasks. Baseline:  Goal status: IN PROGRESS   5.  Perform ADL's with pain not > 2-3/10. Baseline:  Goal status: IN PROGRESS   PLAN: PT FREQUENCY: 2x/week  PT DURATION: 6  weeks  PLANNED INTERVENTIONS: Therapeutic exercises, Therapeutic activity, Neuromuscular re-education, Gait training, Patient/Family education, Self Care, Electrical stimulation, Cryotherapy, Moist heat, and Manual therapy  PLAN FOR NEXT SESSION: Nustep, left LE strengthening, gait training and activities.  Electrical stimulation for pain control if needed.   Kathrynn Ducking, PTA 08/19/2022, 10:39 AM

## 2022-08-24 ENCOUNTER — Ambulatory Visit: Payer: Medicare HMO

## 2022-08-24 DIAGNOSIS — M25552 Pain in left hip: Secondary | ICD-10-CM

## 2022-08-24 DIAGNOSIS — M6281 Muscle weakness (generalized): Secondary | ICD-10-CM

## 2022-08-24 NOTE — Therapy (Signed)
OUTPATIENT PHYSICAL THERAPY LOWER EXTREMITY TREATMENT   Patient Name: Tim Torres MRN: [id number] DOB:July 29, 1951, 71 y.o., male Today's Date: 08/24/2022   PT End of Session - 08/24/22 1005     Visit Number 10    Number of Visits 12    Date for PT Re-Evaluation 09/03/22    Authorization Type FOTO AT LEAST EVERY 5TH VISIT.  PROGRESS NOTE AT 10TH VISIT.  KX MODIFIER AFTER 15 VISITS.    PT Start Time 0945    PT Stop Time 1050    PT Time Calculation (min) 65 min    Activity Tolerance Patient tolerated treatment well    Behavior During Therapy WFL for tasks assessed/performed               Past Medical History:  Diagnosis Date   Arthritis    COVID-19 10/21/2019   Diabetes mellitus without complication ([place])    Hypertension    Myocardial infarction [facility]) 1997   per cardiology records, [place] 11/16/96, s/p PTCS distal CX   Sleep apnea    per patient "does not wear CPAP, unable to tolerate"   Past Surgical History:  Procedure Laterality Date   ANGIOPLASTY     per patient "back in 1997"   [place] Left 07/20/2022   Procedure: TOTAL HIP ARTHROPLASTY ANTERIOR APPROACH;  Surgeon: Renette Butters, MD;  Location: [facility];  Service: Orthopedics;  Laterality: Left;   Patient Active Problem List   Diagnosis Date Noted   S/P total left hip arthroplasty 07/20/2022   Degenerative spondylolisthesis 06/27/2020   DJD (degenerative joint disease) 09/24/2019   Dizziness 09/23/2019   HTN (hypertension) 09/23/2019   HLD (hyperlipidemia) 09/23/2019   CAD (coronary artery disease) 09/23/2019   DM2 (diabetes mellitus, type 2) ([place]) 09/23/2019     REFERRING PROVIDER: Edmonia Lynch MD  REFERRING DIAG: Post-op left total hip replacement.  THERAPY DIAG:  Pain in left hip  Muscle weakness (generalized)  Rationale for Evaluation and Treatment Rehabilitation  ONSET DATE: Surgery date:  07/20/22.  SUBJECTIVE:   SUBJECTIVE STATEMENT: Patient reports  that his hip is hurting a little more today. He saw his surgeon Friday and was told that everything was looking good.   PERTINENT HISTORY: DM, lumbar surgery.  PAIN:  Are you having pain? Yes: NPRS scale: 6-7/10 Pain location: Left hip. Pain description: Ache, sore,  Aggravating factors: See above. Relieving factors: See above.  PRECAUTIONS: Anterior hip.  No ultrasound.   PATIENT GOALS Get around without left hip pain.  OBJECTIVE:  TODAY'S TREATMENT:                                                   10/10 EXERCISE LOG  Exercise Repetitions and Resistance Comments  Nustep  L6 x 15 minutes; seat 12   Hip ADD isometric 3 minutes w/ 5 second hold   AA seated marching  15 reps  Limited by increased hip pain            Blank cell = exercise not performed today  Manual Therapy Soft Tissue Mobilization: left hip flexors and hip abductors, for reduced pain   Modalities  Date:  Unattended Estim: Hip, IFC @ 80-150 Hz w/ 40% scan, 15 mins, Pain  10/5 EXERCISE LOG                 Exercise Repetitions and Resistance Comments  Nustep Lvl 6 x 15 mins, seat 12   Rocker board 4 mins   Lunges 8" step x 25 reps   Standing hip abd X20  LT LE   Hip flexion X20 reps   Hip extension X20 reps   Forward step    LAQ X30 reps 3#    Modalities  Date: 08/19/22 Unattended Estim: Hip, IFC, 10 mins, Pain  HOME EXERCISE PROGRAM: Performing HEP prescribed in hospital.  ASSESSMENT:  CLINICAL IMPRESSION:  Patient has made minimal progress with skilled physical therapy as evidenced by his subjective reports, functional mobility, and progress toward his goals for therapy. He continues to be significantly limited by his left hip pain which inhibits his ability to be progressed with functional activities. Treatment focused on familiar interventions and manual therapy for reduced pain. However, this was not effective as he reported that his hip felt "about the  same" upon the conclusion of treatment. He continues to require skilled physical therapy to address his remaining impairments to maximize his functional mobility.   GOALS:  SHORT TERM GOALS: Target date: 08/24/2022  Ind with an initial HEP. Baseline: Goal status: MET   LONG TERM GOALS: Target date: 09/21/2022   Ind with an advanced HEP. Baseline:  Goal status: IN PROGRESS  2.  Walk without assist device 500 feet. Baseline: using SPC for all mobility Goal status: IN PROGRESS  3.  Perform a reciprocating stair gait with one railing. Baseline: continues to use step to pattern Goal status: IN PROGRESS  4.  Increase left LE strength to 5/5 to increase stability for functional tasks. Baseline:  Goal status: IN PROGRESS   5.  Perform ADL's with pain not > 2-3/10. Baseline: 6-7/10 with daily activities Goal status: IN PROGRESS   PLAN: PT FREQUENCY: 2x/week  PT DURATION: 6 weeks  PLANNED INTERVENTIONS: Therapeutic exercises, Therapeutic activity, Neuromuscular re-education, Gait training, Patient/Family education, Self Care, Electrical stimulation, Cryotherapy, Moist heat, and Manual therapy  PLAN FOR NEXT SESSION: Nustep, left LE strengthening, gait training and activities.  Electrical stimulation for pain control if needed.   Darlin Coco, PT 08/24/2022, 12:04 PM

## 2022-08-26 ENCOUNTER — Ambulatory Visit: Payer: Medicare HMO

## 2022-08-26 DIAGNOSIS — M6281 Muscle weakness (generalized): Secondary | ICD-10-CM

## 2022-08-26 DIAGNOSIS — M25552 Pain in left hip: Secondary | ICD-10-CM

## 2022-08-26 NOTE — Therapy (Signed)
OUTPATIENT PHYSICAL THERAPY LOWER EXTREMITY TREATMENT   Patient Name: Tim Torres MRN: [id number] DOB:October 17, 1951, 71 y.o., male Today's Date: 08/26/2022   PT End of Session - 08/26/22 1038     Visit Number 11    Number of Visits 12    Date for PT Re-Evaluation 09/03/22    Authorization Type FOTO AT LEAST EVERY 5TH VISIT.  PROGRESS NOTE AT 10TH VISIT.  KX MODIFIER AFTER 15 VISITS.    PT Start Time 1030    PT Stop Time 1132    PT Time Calculation (min) 62 min    Activity Tolerance Patient tolerated treatment well    Behavior During Therapy WFL for tasks assessed/performed               Past Medical History:  Diagnosis Date   Arthritis    COVID-19 10/21/2019   Diabetes mellitus without complication ([place])    Hypertension    Myocardial infarction [facility]) 1997   per cardiology records, [place] 11/16/96, s/p PTCS distal CX   Sleep apnea    per patient "does not wear CPAP, unable to tolerate"   Past Surgical History:  Procedure Laterality Date   ANGIOPLASTY     per patient "back in 1997"   [place] Left 07/20/2022   Procedure: TOTAL HIP ARTHROPLASTY ANTERIOR APPROACH;  Surgeon: Renette Butters, MD;  Location: [facility];  Service: Orthopedics;  Laterality: Left;   Patient Active Problem List   Diagnosis Date Noted   S/P total left hip arthroplasty 07/20/2022   Degenerative spondylolisthesis 06/27/2020   DJD (degenerative joint disease) 09/24/2019   Dizziness 09/23/2019   HTN (hypertension) 09/23/2019   HLD (hyperlipidemia) 09/23/2019   CAD (coronary artery disease) 09/23/2019   DM2 (diabetes mellitus, type 2) ([place]) 09/23/2019     REFERRING PROVIDER: Edmonia Lynch MD  REFERRING DIAG: Post-op left total hip replacement.  THERAPY DIAG:  Pain in left hip  Muscle weakness (generalized)  Rationale for Evaluation and Treatment Rehabilitation  ONSET DATE: Surgery date:  07/20/22.  SUBJECTIVE:   SUBJECTIVE STATEMENT: Patient reports  that his hip is hurting a little more today. He saw his surgeon Friday and was told that everything was looking good.   PERTINENT HISTORY: DM, lumbar surgery.  PAIN:  Are you having pain? Yes: NPRS scale: 6-7/10 Pain location: Left hip. Pain description: Ache, sore,  Aggravating factors: See above. Relieving factors: See above.  PRECAUTIONS: Anterior hip.  No ultrasound.   PATIENT GOALS Get around without left hip pain.  OBJECTIVE:  TODAY'S TREATMENT:                                                   10/12 EXERCISE LOG  Exercise Repetitions and Resistance Comments  Nustep  L6 x 15 minutes; seat 12   Hip ADD isometric 3 minutes w/ 5 second hold   AA seated marching  15 reps  Limited by increased hip pain   Hip Abduction Green t-band 3 mins        Blank cell = exercise not performed today  Manual Therapy Soft Tissue Mobilization: left hip flexors and hip abductors, for reduced pain    Modalities  Date:  Unattended Estim: Hip, IFC @ 80-150 Hz w/ 40% scan, 15 mins, Pain  HOME EXERCISE PROGRAM: Performing HEP prescribed in hospital.  ASSESSMENT:  CLINICAL IMPRESSION:  Pt arrives for today's treatment session reporting 6-7/10 left hip pain.  Pt reports he saw the MD Friday and he is pleased with everything.  STW/M performed to left quad and IT band to decrease pain and tone with pt in supine for comfort.  Notable tone throughout quad.  Normal responses to estim and MH noted upon removal.  PT reported 5/10 quad pain at completion of today's treatment session.   GOALS:  SHORT TERM GOALS: Target date: 08/24/2022  Ind with an initial HEP. Baseline: Goal status: MET   LONG TERM GOALS: Target date: 09/21/2022   Ind with an advanced HEP. Baseline:  Goal status: IN PROGRESS  2.  Walk without assist device 500 feet. Baseline: using SPC for all mobility Goal status: IN PROGRESS  3.  Perform a reciprocating stair gait with one  railing. Baseline: continues to use step to pattern Goal status: IN PROGRESS  4.  Increase left LE strength to 5/5 to increase stability for functional tasks. Baseline:  Goal status: IN PROGRESS   5.  Perform ADL's with pain not > 2-3/10. Baseline: 6-7/10 with daily activities Goal status: IN PROGRESS   PLAN: PT FREQUENCY: 2x/week  PT DURATION: 6 weeks  PLANNED INTERVENTIONS: Therapeutic exercises, Therapeutic activity, Neuromuscular re-education, Gait training, Patient/Family education, Self Care, Electrical stimulation, Cryotherapy, Moist heat, and Manual therapy  PLAN FOR NEXT SESSION: Nustep, left LE strengthening, gait training and activities.  Electrical stimulation for pain control if needed.   Kathrynn Ducking, PTA 08/26/2022, 11:35 AM

## 2022-08-31 ENCOUNTER — Encounter: Payer: Self-pay | Admitting: Physical Therapy

## 2022-08-31 ENCOUNTER — Ambulatory Visit: Payer: Medicare HMO | Admitting: Physical Therapy

## 2022-08-31 DIAGNOSIS — M25552 Pain in left hip: Secondary | ICD-10-CM | POA: Diagnosis not present

## 2022-08-31 DIAGNOSIS — M6281 Muscle weakness (generalized): Secondary | ICD-10-CM

## 2022-08-31 NOTE — Therapy (Signed)
OUTPATIENT PHYSICAL THERAPY LOWER EXTREMITY TREATMENT   Patient Name: Tim Torres MRN: [id number] DOB:1951-04-21, 71 y.o., male Today's Date: 08/31/2022   PT End of Session - 08/31/22 0839     Visit Number 12    Number of Visits 18    Date for PT Re-Evaluation 09/24/22    Authorization Type FOTO AT LEAST EVERY 5TH VISIT.  PROGRESS NOTE AT 10TH VISIT.  KX MODIFIER AFTER 15 VISITS.    PT Start Time 0805    PT Stop Time 2423    PT Time Calculation (min) 49 min    Activity Tolerance Patient tolerated treatment well    Behavior During Therapy [facility] for tasks assessed/performed               Past Medical History:  Diagnosis Date   Arthritis    COVID-19 10/21/2019   Diabetes mellitus without complication ([place])    Hypertension    Myocardial infarction [facility]) 1997   per cardiology records, [place] 11/16/96, s/p PTCS distal CX   Sleep apnea    per patient "does not wear CPAP, unable to tolerate"   Past Surgical History:  Procedure Laterality Date   ANGIOPLASTY     per patient "back in 1997"   [place] Left 07/20/2022   Procedure: TOTAL HIP ARTHROPLASTY ANTERIOR APPROACH;  Surgeon: Renette Butters, MD;  Location: [facility];  Service: Orthopedics;  Laterality: Left;   Patient Active Problem List   Diagnosis Date Noted   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  THERAPY DIAG:  Pain in left hip  Muscle weakness (generalized)  Rationale for Evaluation and Treatment Rehabilitation  ONSET DATE: Surgery date:  07/20/22.  SUBJECTIVE:   SUBJECTIVE STATEMENT: Having trouble  lifting left hip and get groin pain with certain movements. PERTINENT HISTORY: DM, lumbar surgery.  PAIN:  Are you having pain? Yes: NPRS scale: 6-7/10 Pain location: Left hip. Pain description: Ache, sore,  Aggravating factors: See above. Relieving factors: See above.  PRECAUTIONS: Anterior hip.  No ultrasound.   PATIENT GOALS Get around without left hip pain.  OBJECTIVE:  TODAY'S TREATMENT:                                                   10/12 EXERCISE LOG  Exercise Repetitions and Resistance Comments  Nustep  L6 x 15 minutes; seat 12   SAQ's with 2# 16 minutes facilitated with [place] e'stim (10 sec ext and 10 sec rest).   Heel slides on "knee slide" 2 minutes with minimal manual resistance in left hip flexion                        Manual:  STW/M x 5 minutes to patient's left hip incision/scar massage/mobility.                          HOME EXERCISE PROGRAM: Performing HEP prescribed in hospital.  ASSESSMENT:  CLINICAL IMPRESSION:  Patient assessed in supine with both knees in full extension his legs lengths are equal.  He palpably has a significant amount of scar tissue.  He responded very well to STW/M/scar massage/mobility and stated he felt better afterwards.  He demonstrated good left quadriceps activation with 4 electrode [place] electrical stimulation.  GOALS:  SHORT TERM GOALS: Target date: 08/24/2022  Ind with an initial HEP. Baseline: Goal status: MET   LONG TERM GOALS: Target date: 09/21/2022   Ind with an advanced HEP. Baseline:  Goal status: IN PROGRESS  2.  Walk without assist device 500 feet. Baseline: using SPC for all mobility Goal status: IN PROGRESS  3.  Perform a reciprocating stair gait with one railing. Baseline: continues to use step to pattern Goal status: IN PROGRESS  4.  Increase left LE strength to 5/5 to increase stability for functional tasks. Baseline:  Goal status: IN PROGRESS   5.  Perform ADL's with pain not >  2-3/10. Baseline: 6-7/10 with daily activities Goal status: IN PROGRESS   PLAN: PT FREQUENCY: 2x/week  PT DURATION: 6 weeks  PLANNED INTERVENTIONS: Therapeutic exercises, Therapeutic activity, Neuromuscular re-education, Gait training, Patient/Family education, Self Care, Electrical stimulation, Cryotherapy, Moist heat, and Manual therapy  PLAN FOR NEXT SESSION: Nustep, left LE strengthening, gait training and activities.  Electrical stimulation for pain control if needed.   Arius Harnois, [place], PT 08/31/2022, 9:28 AM

## 2022-09-02 ENCOUNTER — Ambulatory Visit: Payer: Medicare HMO

## 2022-09-02 DIAGNOSIS — M25552 Pain in left hip: Secondary | ICD-10-CM | POA: Diagnosis not present

## 2022-09-02 DIAGNOSIS — M6281 Muscle weakness (generalized): Secondary | ICD-10-CM

## 2022-09-02 NOTE — Therapy (Signed)
OUTPATIENT PHYSICAL THERAPY LOWER EXTREMITY TREATMENT   Patient Name: LINK BURGESON MRN: [id number] DOB:1951/07/05, 71 y.o., male Today's Date: 09/02/2022   PT End of Session - 09/02/22 1529     Visit Number 13    Number of Visits 18    Date for PT Re-Evaluation 09/24/22    Authorization Type FOTO AT LEAST EVERY 5TH VISIT.  PROGRESS NOTE AT 10TH VISIT.  KX MODIFIER AFTER 15 VISITS.    PT Start Time 1515    PT Stop Time 1612    PT Time Calculation (min) 57 min    Activity Tolerance Patient tolerated treatment well    Behavior During Therapy WFL for tasks assessed/performed               Past Medical History:  Diagnosis Date   Arthritis    COVID-19 10/21/2019   Diabetes mellitus without complication ([place])    Hypertension    Myocardial infarction [facility]) 1997   per cardiology records, [place] 11/16/96, s/p PTCS distal CX   Sleep apnea    per patient "does not wear CPAP, unable to tolerate"   Past Surgical History:  Procedure Laterality Date   ANGIOPLASTY     per patient "back in 1997"   [place] Left 07/20/2022   Procedure: TOTAL HIP ARTHROPLASTY ANTERIOR APPROACH;  Surgeon: Renette Butters, MD;  Location: [facility];  Service: Orthopedics;  Laterality: Left;   Patient Active Problem List   Diagnosis Date Noted   S/P total left hip arthroplasty 07/20/2022   Degenerative spondylolisthesis 06/27/2020   DJD (degenerative joint disease) 09/24/2019   Dizziness 09/23/2019   HTN (hypertension) 09/23/2019   HLD (hyperlipidemia) 09/23/2019   CAD (coronary artery disease) 09/23/2019   DM2 (diabetes mellitus, type 2) ([place]) 09/23/2019     REFERRING PROVIDER: Edmonia Lynch MD  REFERRING DIAG: Post-op left total hip replacement.  THERAPY DIAG:  Pain in left hip  Muscle weakness (generalized)  Rationale for Evaluation and Treatment Rehabilitation  ONSET DATE: Surgery date:  07/20/22.  SUBJECTIVE:   SUBJECTIVE STATEMENT: Having trouble  lifting left hip and get groin pain with certain movements. PERTINENT HISTORY: DM, lumbar surgery.  PAIN:  Are you having pain? Yes: NPRS scale: 6-7/10 Pain location: Left hip. Pain description: Ache, sore,  Aggravating factors: See above. Relieving factors: See above.  PRECAUTIONS: Anterior hip.  No ultrasound.   PATIENT GOALS Get around without left hip pain.  OBJECTIVE:  TODAY'S TREATMENT:                                                   10/12 EXERCISE LOG  Exercise Repetitions and Resistance Comments  Nustep  L7 x 15 minutes; seat 12   SAQ's with 2#    Heel slides on "knee slide"             Manual:  STW/M to patient's left hip incision/scar massage/mobility to decrease pain and tone.  Modalities  Date:  Unattended Estim: Knee, IFC 80-150 Hz, 10 mins, Pain and Tone  HOME EXERCISE PROGRAM: Performing HEP prescribed in hospital.  ASSESSMENT:  CLINICAL IMPRESSION:  Pt arrives for today's treatment session reporting 6-7/10 left hip pain.  Pt frustrated and discouraged by continued IT band one and pain.  STW/M and scar massage performed to left hip and incision scar  to decrease pain, tone, and scar tissue.  Pt reports good results.  Normal responses to estim noted upon removal.  Pt reported 4/10 left hip pain at completion of today's treatment session.   GOALS:  SHORT TERM GOALS: Target date: 08/24/2022  Ind with an initial HEP. Baseline: Goal status: MET   LONG TERM GOALS: Target date: 09/21/2022   Ind with an advanced HEP. Baseline:  Goal status: IN PROGRESS  2.  Walk without assist device 500 feet. Baseline: using SPC for all mobility Goal status: IN PROGRESS  3.  Perform a reciprocating stair gait with one railing. Baseline: continues to use step to pattern Goal status: IN PROGRESS  4.  Increase left LE strength to 5/5 to increase stability for functional tasks. Baseline:  Goal status: IN PROGRESS   5.  Perform ADL's with pain not >  2-3/10. Baseline: 6-7/10 with daily activities Goal status: IN PROGRESS   PLAN: PT FREQUENCY: 2x/week  PT DURATION: 6 weeks  PLANNED INTERVENTIONS: Therapeutic exercises, Therapeutic activity, Neuromuscular re-education, Gait training, Patient/Family education, Self Care, Electrical stimulation, Cryotherapy, Moist heat, and Manual therapy  PLAN FOR NEXT SESSION: Nustep, left LE strengthening, gait training and activities.  Electrical stimulation for pain control if needed.   Kathrynn Ducking, PTA 09/02/2022, 4:15 PM

## 2022-09-07 ENCOUNTER — Ambulatory Visit: Payer: Medicare HMO | Admitting: *Deleted

## 2022-09-09 ENCOUNTER — Encounter: Payer: Medicare HMO | Admitting: *Deleted

## 2023-08-01 ENCOUNTER — Other Ambulatory Visit (HOSPITAL_COMMUNITY): Payer: Self-pay | Admitting: Neurosurgery

## 2023-08-01 DIAGNOSIS — M5416 Radiculopathy, lumbar region: Secondary | ICD-10-CM

## 2023-08-19 ENCOUNTER — Ambulatory Visit (HOSPITAL_COMMUNITY)
Admission: RE | Admit: 2023-08-19 | Discharge: 2023-08-19 | Disposition: A | Discharge status: Home / Self Care | Payer: Medicare HMO | Source: Ambulatory Visit | Attending: Neurosurgery | Admitting: Neurosurgery

## 2023-08-19 DIAGNOSIS — M5416 Radiculopathy, lumbar region: Secondary | ICD-10-CM | POA: Insufficient documentation

## 2023-08-23 ENCOUNTER — Ambulatory Visit (INDEPENDENT_AMBULATORY_CARE_PROVIDER_SITE_OTHER): Payer: Medicare HMO | Admitting: Nurse Practitioner

## 2023-08-23 ENCOUNTER — Encounter: Payer: Self-pay | Admitting: Nurse Practitioner

## 2023-08-23 VITALS — BP 144/72 | HR 58 | Temp 98.4°F | Ht 72.0 in | Wt 254.4 lb

## 2023-08-23 DIAGNOSIS — M1040 Other secondary gout, unspecified site: Secondary | ICD-10-CM

## 2023-08-23 DIAGNOSIS — E782 Mixed hyperlipidemia: Secondary | ICD-10-CM | POA: Diagnosis not present

## 2023-08-23 DIAGNOSIS — M153 Secondary multiple arthritis: Secondary | ICD-10-CM

## 2023-08-23 DIAGNOSIS — Z7689 Persons encountering health services in other specified circumstances: Secondary | ICD-10-CM | POA: Diagnosis not present

## 2023-08-23 DIAGNOSIS — E559 Vitamin D deficiency, unspecified: Secondary | ICD-10-CM

## 2023-08-23 DIAGNOSIS — I10 Essential (primary) hypertension: Secondary | ICD-10-CM

## 2023-08-23 DIAGNOSIS — I25118 Atherosclerotic heart disease of native coronary artery with other forms of angina pectoris: Secondary | ICD-10-CM

## 2023-08-23 DIAGNOSIS — E538 Deficiency of other specified B group vitamins: Secondary | ICD-10-CM

## 2023-08-23 DIAGNOSIS — F5101 Primary insomnia: Secondary | ICD-10-CM

## 2023-08-23 DIAGNOSIS — Z79899 Other long term (current) drug therapy: Secondary | ICD-10-CM

## 2023-08-23 DIAGNOSIS — E119 Type 2 diabetes mellitus without complications: Secondary | ICD-10-CM

## 2023-08-23 DIAGNOSIS — Z96642 Presence of left artificial hip joint: Secondary | ICD-10-CM

## 2023-08-23 NOTE — Progress Notes (Signed)
New Patient Office Visit  [name] was seen today to establish care.  Below references diagnostics and current plan of care:   Assessment & Plan:    Encounter to establish care Recent medical records and labs reviewed  Discussed will not prescribe Ambien or Hydrocodone  Essential hypertension/CAD Elevated in clinic - controlled in home Continue Metoprolol Discussed DASH (Dietary Approaches to Stop Hypertension) DASH diet is lower in sodium than a typical American diet. Cut back on foods that are high in saturated fat, cholesterol, and trans fats. Eat more whole-grain foods, fish, poultry, and nuts Remain active and exercise as tolerated daily.  Monitor BP at home-Call if greater than 130/80.  Check CMP/CBC  - CBC with Differential/Platelet - COMPLETE METABOLIC PANEL WITH GFR  Mixed hyperlipidemia Continue Lovastatin Discussed lifestyle modifications. Recommended diet heavy in fruits and veggies, omega 3's. Decrease consumption of animal meats, cheeses, and dairy products. Remain active and exercise as tolerated. Continue to monitor. Check lipids/TSH  - Lipid panel  Type 2 diabetes mellitus without complication, without long-term current use of insulin (HCC) Continue Metformin Education: Reviewed 'ABCs' of diabetes management  Discussed goals to be met and/or maintained include A1C (<7) Blood pressure (<130/80) Cholesterol (LDL <70) Continue Eye Exam yearly  Continue Dental Exam Q6 mo Discussed dietary recommendations Discussed Physical Activity recommendations Check A1C  - Hemoglobin A1c  Medication management All medications discussed and reviewed in full. All questions and concerns regarding medications addressed.    - CBC with Differential/Platelet - COMPLETE METABOLIC PANEL WITH GFR - Lipid panel - Hemoglobin A1c - Vitamin B12 - VITAMIN D 25 Hydroxy (Vit-D Deficiency, Fractures)  Vitamin D deficiency Continue supplement for goal of  60-100 Monitor Vitamin D levels  - VITAMIN D 25 Hydroxy (Vit-D Deficiency, Fractures)  B12 deficiency Monitor levels  - Vitamin B12  Primary insomnia Will not prescribe Ambien. Continue Magnesium Discussed good sleep hygiene. Establish bed and wake times. Sleep restriction-only sleep estimated hrs sleep. Bed only for sex and sleep, only sleep when sleepy, out of bed if anxious (stimulus control). Reviewed relaxation techniques, mindful meditations. Expected sleep duration. Addressed worries about not sleeping.   DJD Will not prescribe Hydrocodone or Oxycodone  Continue Gabapentin, Methocarbamol Stay well hydrated, rest when flared, strecth Continue to monitor  Gout Controlled Continue Allopurinol  Discussed low purine diet   Notify office for further evaluation and treatment, questions or concerns if any reported s/s fail to improve.   The patient was advised to call back or seek an in-person evaluation if any symptoms worsen or if the condition fails to improve as anticipated.   Further disposition pending results of labs. Discussed med's effects and SE's.    I discussed the assessment and treatment plan with the patient. The patient was provided an opportunity to ask questions and all were answered. The patient agreed with the plan and demonstrated an understanding of the instructions.  Discussed med's effects and SE's. Screening labs and tests as requested with regular follow-up as recommended.  I provided 35 minutes of face-to-face time during this encounter including counseling, chart review, and critical decision making was preformed.  Today's Plan of Care is based on a patient-centered health care approach known as shared decision making - the decisions, tests and treatments allow for patient preferences and values to be balanced with clinical evidence.      Subjective    Patient ID: Tim Torres, male    DOB: 1951-11-02  Age: 72 y.o. MRN:  [id number]  CC:  Chief Complaint  Patient presents with   Establish Care    HPI Tim Torres presents to establish care.  He has been established with [facility] recent visit 12/2022.  He has no complaints today.  Reports HTN.  Currently controlled with Metoprolol however elevated in clinic today d/t seeing anew provider.  Stays well controlled at home <130/80. BP Readings from Last 3 Encounters:  08/23/23 (!) 144/72  07/21/22 135/78  06/28/22 123/87   Follows with Dr.  Dot Been [facility], last seen 06/13/23.  Felt to be doing well from a cardiac standpoint.  CAD was said to be stable with no angina.  He is scheduled to follow yearly, next apt /2025.  He is on cholesterol medication Lovastatin and denies myalgias. His cholesterol is not at goal with elevated triglycerides. The cholesterol last visit was:   Lab Results  Component Value Date   CHOL 149 08/23/2023   HDL 39 (L) 08/23/2023   LDLCALC 74 08/23/2023   TRIG 299 (H) 08/23/2023   CHOLHDL 3.8 08/23/2023    He shares a hx of DM2, currently well controlled with Metformin.  Denies polyuria, polydipsia.   Lab Results  Component Value Date   HGBA1C 7.0 (H) 08/23/2023   Has had a hx of Vitamin D deficiency - has taken supplement in the past.  Last vitamin D Lab Results  Component Value Date   VD25OH 30 08/23/2023   Has primary insomnia.  Has been treated with Ambien the past.  Currently effective.  Also takes magnesium.  He also has a hx of DJD in multiple sites.  He is s/t total left hip arthroplasty with Dr. Eulah Torres, 07/2022.  Completed PT.  Has been on narcotic pain medication in the past but feels moving forward he will be able to go without taking thee medications.  He also takes Gabapentin and Methocarbamol which he feels is effective.    Outpatient Encounter Medications as of 08/23/2023  Medication Sig   acetaminophen (TYLENOL) 500 MG tablet Take 2 tablets (1,000 mg total) by mouth every 8  (eight) hours as needed for mild pain or moderate pain.   allopurinol (ZYLOPRIM) 300 MG tablet Take 300 mg by mouth daily.   aspirin 325 MG tablet Take 325 mg by mouth daily.   HYDROcodone-acetaminophen (NORCO) 10-325 MG tablet Take 1 tablet by mouth every 4 (four) hours as needed for moderate pain ((score 4 to 6)). (Patient taking differently: Take 1-2 tablets by mouth 3 (three) times daily as needed for moderate pain ((score 4 to 6)).)   lovastatin (MEVACOR) 40 MG tablet Take 40 mg by mouth 2 (two) times daily.    meclizine (ANTIVERT) 25 MG tablet Take 1 tablet (25 mg total) by mouth 3 (three) times daily as needed for dizziness or nausea.   metFORMIN (GLUCOPHAGE-XR) 500 MG 24 hr tablet Take 500-1,000 mg by mouth See admin instructions. Take 500 mg in the morning and 1000 mg in the evening   metoprolol tartrate (LOPRESSOR) 50 MG tablet Take 50 mg by mouth 2 (two) times daily.    tiZANidine (ZANAFLEX) 4 MG capsule Take 4 mg by mouth daily as needed for muscle spasms.   zolpidem (AMBIEN) 5 MG tablet Take 5 mg by mouth at bedtime as needed for sleep.    Cholecalciferol (VITAMIN D) 50 MCG (2000 UT) tablet Take 2,000 Units by mouth daily. Take with 1000 unit vitamin d to equal 3000 units daily (Patient not taking: Reported on 08/23/2023)  cholecalciferol (VITAMIN D3) 25 MCG (1000 UNIT) tablet Take 1,000 Units by mouth daily. Take with 2000 unit vitamin d to equal 3000 units daily (Patient not taking: Reported on 08/23/2023)   gabapentin (NEURONTIN) 300 MG capsule Take 1 capsule (300 mg total) by mouth 2 (two) times daily for 14 days. For 2 weeks post op for pain.   Magnesium 500 MG TABS Take 500 mg by mouth daily. (Patient not taking: Reported on 08/23/2023)   methocarbamol (ROBAXIN-750) 750 MG tablet Take 1 tablet (750 mg total) by mouth every 8 (eight) hours as needed for muscle spasms. (Patient not taking: Reported on 08/23/2023)   ondansetron (ZOFRAN-ODT) 4 MG disintegrating tablet Take 1 tablet (4 mg  total) by mouth 2 (two) times daily as needed for nausea or vomiting. (Patient not taking: Reported on 08/23/2023)   Oxycodone HCl 10 MG TABS Take 0.5-1 tablets (5-10 mg total) by mouth every 8 (eight) hours as needed (for severe acute pain after surgery that is not resolved by your normal daily Vicodin). (Patient not taking: Reported on 08/23/2023)   senna-docusate (SENOKOT-S) 8.6-50 MG tablet Take 1 tablet by mouth daily. To prevent constipation post-op (Patient not taking: Reported on 08/23/2023)   sildenafil (VIAGRA) 100 MG tablet Take 100 mg by mouth as needed for erectile dysfunction. (Patient not taking: Reported on 08/23/2023)   No facility-administered encounter medications on file as of 08/23/2023.    Past Medical History:  Diagnosis Date   Arthritis    COVID-19 10/21/2019   Diabetes mellitus without complication (HCC)    Hypertension    Myocardial infarction [facility]) 1997   per cardiology records, [place] 11/16/96, s/p PTCS distal CX   Sleep apnea    per patient "does not wear CPAP, unable to tolerate"    Past Surgical History:  Procedure Laterality Date   ANGIOPLASTY     per patient "back in 1997"   BACK SURGERY     TOTAL HIP ARTHROPLASTY Left 07/20/2022   Procedure: TOTAL HIP ARTHROPLASTY ANTERIOR APPROACH;  Surgeon: Sheral Apley, MD;  Location: [facility];  Service: Orthopedics;  Laterality: Left;    No family history on file.  Social History   Socioeconomic History   Marital status: Significant Other    Spouse name: Not on file   Number of children: Not on file   Years of education: Not on file   Highest education level: Not on file  Occupational History   Not on file  Tobacco Use   Smoking status: Former    Current packs/day: 0.00    Types: Cigarettes    Quit date: 10/15/1982    Years since quitting: 40.8   Smokeless tobacco: Never  Vaping Use   Vaping status: Never Used  Substance and Sexual Activity   Alcohol use: Yes    Alcohol/week: 1.0 standard drink of alcohol     Types: 1 Cans of beer per week    Comment: "occasionallyy   Drug use: Never   Sexual activity: Not on file  Other Topics Concern   Not on file  Social History Narrative   Not on file   Social Determinants of Health   Financial Resource Strain: Not on file  Food Insecurity: No Food Insecurity (07/20/2022)   Hunger Vital Sign    Worried About Running Out of Food in the Last Year: Never true    Ran Out of Food in the Last Year: Never true  Transportation Needs: No Transportation Needs (07/20/2022)   PRAPARE - Transportation  Lack of Transportation (Medical): No    Lack of Transportation (Non-Medical): No  Physical Activity: Not on file  Stress: Not on file  Social Connections: Unknown (03/30/2022)   [facility] from [facility]   Social Network    Social Network: Not on file  Intimate Partner Violence: Not At Risk (07/20/2022)   Humiliation, Afraid, Rape, and Kick questionnaire    Fear of Current or Ex-Partner: No    Emotionally Abused: No    Physically Abused: No    Sexually Abused: No    Review of Systems  Constitutional: Negative.   HENT: Negative.    Eyes: Negative.   Respiratory: Negative.    Cardiovascular: Negative.   Gastrointestinal: Negative.   Genitourinary: Negative.   Musculoskeletal:  Positive for joint pain.  Skin: Negative.   Neurological: Negative.   Endo/Heme/Allergies: Negative.   Psychiatric/Behavioral:  The patient has insomnia.     Objective    BP (!) 144/72   Pulse (!) 58   Temp 98.4 F (36.9 C)   Ht 6' (1.829 m)   Wt 254 lb 6.4 oz (115.4 kg)   SpO2 98%   BMI 34.50 kg/m   Physical Exam Constitutional:      Appearance: Normal appearance. He is obese.  HENT:     Head: Normocephalic and atraumatic.     Right Ear: Tympanic membrane, ear canal and external ear normal.     Left Ear: Tympanic membrane, ear canal and external ear normal.     Nose: Nose normal.     Mouth/Throat:     Mouth: Mucous membranes are moist.   Eyes:     Extraocular Movements: Extraocular movements intact.     Conjunctiva/sclera: Conjunctivae normal.     Pupils: Pupils are equal, round, and reactive to light.  Cardiovascular:     Rate and Rhythm: Normal rate and regular rhythm.     Pulses: Normal pulses.     Heart sounds: Normal heart sounds.  Pulmonary:     Effort: Pulmonary effort is normal.     Breath sounds: Normal breath sounds.  Abdominal:     General: Bowel sounds are normal.     Palpations: Abdomen is soft.  Musculoskeletal:        General: Normal range of motion.     Cervical back: Normal range of motion and neck supple.  Skin:    General: Skin is warm.  Neurological:     Mental Status: He is alert and oriented to person, place, and time.  Psychiatric:        Mood and Affect: Mood normal.        Behavior: Behavior normal.     Return in about 3 months (around 11/23/2023).   Adela Glimpse, NP

## 2023-08-24 LAB — COMPLETE METABOLIC PANEL WITH GFR
AG Ratio: 1.7 (calc) (ref 1.0–2.5)
ALT: 23 U/L (ref 9–46)
AST: 24 U/L (ref 10–35)
Albumin: 4.5 g/dL (ref 3.6–5.1)
Alkaline phosphatase (APISO): 72 U/L (ref 35–144)
BUN/Creatinine Ratio: 20 (calc) (ref 6–22)
BUN: 32 mg/dL — ABNORMAL HIGH (ref 7–25)
CO2: 24 mmol/L (ref 20–32)
Calcium: 9.3 mg/dL (ref 8.6–10.3)
Chloride: 107 mmol/L (ref 98–110)
Creat: 1.59 mg/dL — ABNORMAL HIGH (ref 0.70–1.28)
Globulin: 2.7 g/dL (ref 1.9–3.7)
Glucose, Bld: 146 mg/dL — ABNORMAL HIGH (ref 65–99)
Potassium: 5.2 mmol/L (ref 3.5–5.3)
Sodium: 139 mmol/L (ref 135–146)
Total Bilirubin: 0.7 mg/dL (ref 0.2–1.2)
Total Protein: 7.2 g/dL (ref 6.1–8.1)
eGFR: 46 mL/min/{1.73_m2} — ABNORMAL LOW (ref >=60)

## 2023-08-24 LAB — CBC WITH DIFFERENTIAL/PLATELET
Absolute Monocytes: 390 {cells}/uL (ref 200–950)
Basophils Absolute: 42 {cells}/uL (ref 0–200)
Basophils Relative: 0.8 %
Eosinophils Absolute: 322 {cells}/uL (ref 15–500)
Eosinophils Relative: 6.2 %
HCT: 41.5 % (ref 38.5–50.0)
Hemoglobin: 13.4 g/dL (ref 13.2–17.1)
Lymphs Abs: 1612 {cells}/uL (ref 850–3900)
MCH: 31.5 pg (ref 27.0–33.0)
MCHC: 32.3 g/dL (ref 32.0–36.0)
MCV: 97.6 fL (ref 80.0–100.0)
MPV: 10.8 fL (ref 7.5–12.5)
Monocytes Relative: 7.5 %
Neutro Abs: 2834 {cells}/uL (ref 1500–7800)
Neutrophils Relative %: 54.5 %
Platelets: 127 10*3/uL — ABNORMAL LOW (ref 140–400)
RBC: 4.25 10*6/uL (ref 4.20–5.80)
RDW: 13.5 % (ref 11.0–15.0)
Total Lymphocyte: 31 %
WBC: 5.2 10*3/uL (ref 3.8–10.8)

## 2023-08-24 LAB — LIPID PANEL
Cholesterol: 149 mg/dL (ref <200)
HDL: 39 mg/dL — ABNORMAL LOW (ref >=40)
LDL Cholesterol (Calc): 74 mg/dL
Non-HDL Cholesterol (Calc): 110 mg/dL (ref <130)
Total CHOL/HDL Ratio: 3.8 (calc) (ref <5.0)
Triglycerides: 299 mg/dL — ABNORMAL HIGH (ref <150)

## 2023-08-24 LAB — VITAMIN B12: Vitamin B-12: 297 pg/mL (ref 200–1100)

## 2023-08-24 LAB — VITAMIN D 25 HYDROXY (VIT D DEFICIENCY, FRACTURES): Vit D, 25-Hydroxy: 30 ng/mL (ref 30–100)

## 2023-08-24 LAB — HEMOGLOBIN A1C
Hgb A1c MFr Bld: 7 %{Hb} — ABNORMAL HIGH (ref <5.7)
Mean Plasma Glucose: 154 mg/dL
eAG (mmol/L): 8.5 mmol/L

## 2023-08-29 ENCOUNTER — Encounter: Payer: Self-pay | Admitting: Nurse Practitioner

## 2023-08-29 NOTE — Patient Instructions (Signed)

## 2023-10-19 NOTE — Progress Notes (Unsigned)
Assessment and Plan:  There are no diagnoses linked to this encounter.    Further disposition pending results of labs. Discussed med's effects and SE's.   Over 30 minutes of exam, counseling, chart review, and critical decision making was performed.   Future Appointments  Date Time Provider Department Center  10/20/2023  3:30 PM Raynelle Dick, NP [facility] None    ------------------------------------------------------------------------------------------------------------------   HPI There were no vitals taken for this visit. 72 y.o.male presents for  Past Medical History:  Diagnosis Date   Arthritis    COVID-19 10/21/2019   Diabetes mellitus without complication (HCC)    Hypertension    Myocardial infarction [facility]) 1997   per cardiology records, MI 11/16/96, s/p PTCS distal CX   Sleep apnea    per patient "does not wear CPAP, unable to tolerate"     Allergies  Allergen Reactions   Shellfish Allergy Shortness Of Breath and Swelling   Atorvastatin Other (See Comments)    Myalgia    Rosuvastatin Other (See Comments)    myalgia    Current Outpatient Medications on File Prior to Visit  Medication Sig   acetaminophen (TYLENOL) 500 MG tablet Take 2 tablets (1,000 mg total) by mouth every 8 (eight) hours as needed for mild pain or moderate pain.   allopurinol (ZYLOPRIM) 300 MG tablet Take 300 mg by mouth daily.   aspirin 325 MG tablet Take 325 mg by mouth daily.   Cholecalciferol (VITAMIN D) 50 MCG (2000 UT) tablet Take 2,000 Units by mouth daily. Take with 1000 unit vitamin d to equal 3000 units daily (Patient not taking: Reported on 08/23/2023)   cholecalciferol (VITAMIN D3) 25 MCG (1000 UNIT) tablet Take 1,000 Units by mouth daily. Take with 2000 unit vitamin d to equal 3000 units daily (Patient not taking: Reported on 08/23/2023)   HYDROcodone-acetaminophen (NORCO) 10-325 MG tablet Take 1 tablet by mouth every 4 (four) hours as needed for moderate pain ((score 4 to 6)).  (Patient taking differently: Take 1-2 tablets by mouth 3 (three) times daily as needed for moderate pain ((score 4 to 6)).)   lovastatin (MEVACOR) 40 MG tablet Take 40 mg by mouth 2 (two) times daily.    Magnesium 500 MG TABS Take 500 mg by mouth daily. (Patient not taking: Reported on 08/23/2023)   meclizine (ANTIVERT) 25 MG tablet Take 1 tablet (25 mg total) by mouth 3 (three) times daily as needed for dizziness or nausea.   metFORMIN (GLUCOPHAGE-XR) 500 MG 24 hr tablet Take 500-1,000 mg by mouth See admin instructions. Take 500 mg in the morning and 1000 mg in the evening   methocarbamol (ROBAXIN-750) 750 MG tablet Take 1 tablet (750 mg total) by mouth every 8 (eight) hours as needed for muscle spasms. (Patient not taking: Reported on 08/23/2023)   metoprolol tartrate (LOPRESSOR) 50 MG tablet Take 50 mg by mouth 2 (two) times daily.    ondansetron (ZOFRAN-ODT) 4 MG disintegrating tablet Take 1 tablet (4 mg total) by mouth 2 (two) times daily as needed for nausea or vomiting. (Patient not taking: Reported on 08/23/2023)   Oxycodone HCl 10 MG TABS Take 0.5-1 tablets (5-10 mg total) by mouth every 8 (eight) hours as needed (for severe acute pain after surgery that is not resolved by your normal daily Vicodin). (Patient not taking: Reported on 08/23/2023)   senna-docusate (SENOKOT-S) 8.6-50 MG tablet Take 1 tablet by mouth daily. To prevent constipation post-op (Patient not taking: Reported on 08/23/2023)   sildenafil (VIAGRA) 100 MG tablet  Take 100 mg by mouth as needed for erectile dysfunction. (Patient not taking: Reported on 08/23/2023)   tiZANidine (ZANAFLEX) 4 MG capsule Take 4 mg by mouth daily as needed for muscle spasms.   zolpidem (AMBIEN) 5 MG tablet Take 5 mg by mouth at bedtime as needed for sleep.    No current facility-administered medications on file prior to visit.    ROS: all negative except above.   Physical Exam:  There were no vitals taken for this visit.  General Appearance:  Well nourished, in no apparent distress. Eyes: PERRLA, EOMs, conjunctiva no swelling or erythema Sinuses: No Frontal/maxillary tenderness ENT/Mouth: Ext aud canals clear, TMs without erythema, bulging. No erythema, swelling, or exudate on post pharynx.  Tonsils not swollen or erythematous. Hearing normal.  Neck: Supple, thyroid normal.  Respiratory: Respiratory effort normal, BS equal bilaterally without rales, rhonchi, wheezing or stridor.  Cardio: RRR with no MRGs. Brisk peripheral pulses without edema.  Abdomen: Soft, + BS.  Non tender, no guarding, rebound, hernias, masses. Lymphatics: Non tender without lymphadenopathy.  Musculoskeletal: Full ROM, 5/5 strength, normal gait.  Skin: Warm, dry without rashes, lesions, ecchymosis.  Neuro: Cranial nerves intact. Normal muscle tone, no cerebellar symptoms. Sensation intact.  Psych: Awake and oriented X 3, normal affect, Insight and Judgment appropriate.     Raynelle Dick, NP 12:46 PM [facility]

## 2023-10-20 ENCOUNTER — Encounter: Payer: Self-pay | Admitting: Nurse Practitioner

## 2023-10-20 ENCOUNTER — Ambulatory Visit (INDEPENDENT_AMBULATORY_CARE_PROVIDER_SITE_OTHER): Payer: Medicare HMO | Admitting: Nurse Practitioner

## 2023-10-20 VITALS — BP 132/68 | HR 79 | Temp 98.1°F | Ht 72.0 in | Wt 247.8 lb

## 2023-10-20 DIAGNOSIS — Z79899 Other long term (current) drug therapy: Secondary | ICD-10-CM

## 2023-10-20 DIAGNOSIS — I10 Essential (primary) hypertension: Secondary | ICD-10-CM | POA: Diagnosis not present

## 2023-10-20 DIAGNOSIS — R197 Diarrhea, unspecified: Secondary | ICD-10-CM | POA: Diagnosis not present

## 2023-10-20 NOTE — Patient Instructions (Signed)
Use Immodium 2 tabs after each loose bowel movement Push fluids BRATY diet- Bananas, rice, apple sauce, Tea,Toast and yogurt  Diarrhea, Adult Diarrhea is frequent loose and sometimes watery bowel movements. Diarrhea can make you feel weak and cause you to become dehydrated. Dehydration is a condition in which there is not enough water or other fluids in the body. Dehydration can make you tired and thirsty, cause you to have a dry mouth, and decrease how often you urinate. Diarrhea typically lasts 2-3 days. However, it can last longer if it is a sign of something more serious. It is important to treat your diarrhea as told by your health care provider. Follow these instructions at home: Eating and drinking     Follow these recommendations as told by your health care provider: Take an oral rehydration solution (ORS). This is an over-the-counter medicine that helps return your body to its normal balance of nutrients and water. It is found at pharmacies and retail stores. Drink enough fluid to keep your urine pale yellow. Drink fluids such as water, diluted fruit juice, and low-calorie sports drinks. You can drink milk also, if desired. Sucking on ice chips is another way to get fluids. Avoid drinking fluids that contain a lot of sugar or caffeine, such as soda, energy drinks, and regular sports drinks. Avoid alcohol. Eat bland, easy-to-digest foods in small amounts as you are able. These foods include bananas, applesauce, rice, lean meats, toast, and crackers. Avoid spicy or fatty foods.  Medicines Take over-the-counter and prescription medicines only as told by your health care provider. If you were prescribed antibiotics, take them as told by your health care provider. Do not stop using the antibiotic even if you start to feel better. General instructions  Wash your hands often using soap and water for at least 20 seconds. If soap and water are not available, use hand sanitizer. Others in  the household should wash their hands as well. Hands should be washed: After using the toilet or changing a diaper. Before preparing, cooking, or serving food. While caring for a sick person or while visiting someone in a hospital. Rest at home while you recover. Take a warm bath to relieve any burning or pain from frequent diarrhea episodes. Watch your condition for any changes. Contact a health care provider if: You have a fever. Your diarrhea gets worse. You have new symptoms. You vomit every time you eat or drink. You feel light-headed, dizzy, or have a headache. You have muscle cramps. You have signs of dehydration, such as: Dark urine, very little urine, or no urine. Cracked lips. Dry mouth. Sunken eyes. Sleepiness. Weakness. You have bloody or black stools or stools that look like tar. You have severe pain, cramping, or bloating in your abdomen. Your skin feels cold and clammy. You feel confused. Get help right away if: You have chest pain or your heart is beating very quickly. You have trouble breathing or you are breathing very quickly. You feel extremely weak or you faint. These symptoms may be an emergency. Get help right away. Call 911. Do not wait to see if the symptoms will go away. Do not drive yourself to the hospital. This information is not intended to replace advice given to you by your health care provider. Make sure you discuss any questions you have with your health care provider. Document Revised: 04/20/2022 Document Reviewed: 04/20/2022 Elsevier Patient Education  2024 [employer].

## 2023-10-21 LAB — COMPLETE METABOLIC PANEL WITH GFR
AG Ratio: 1.6 (calc) (ref 1.0–2.5)
ALT: 18 U/L (ref 9–46)
AST: 20 U/L (ref 10–35)
Albumin: 4.4 g/dL (ref 3.6–5.1)
Alkaline phosphatase (APISO): 76 U/L (ref 35–144)
BUN/Creatinine Ratio: 21 (calc) (ref 6–22)
BUN: 35 mg/dL — ABNORMAL HIGH (ref 7–25)
CO2: 20 mmol/L (ref 20–32)
Calcium: 9.6 mg/dL (ref 8.6–10.3)
Chloride: 106 mmol/L (ref 98–110)
Creat: 1.66 mg/dL — ABNORMAL HIGH (ref 0.70–1.28)
Globulin: 2.7 g/dL (ref 1.9–3.7)
Glucose, Bld: 147 mg/dL — ABNORMAL HIGH (ref 65–139)
Potassium: 4.5 mmol/L (ref 3.5–5.3)
Sodium: 138 mmol/L (ref 135–146)
Total Bilirubin: 0.6 mg/dL (ref 0.2–1.2)
Total Protein: 7.1 g/dL (ref 6.1–8.1)
eGFR: 44 mL/min/{1.73_m2} — ABNORMAL LOW (ref >=60)

## 2023-10-21 LAB — CBC WITH DIFFERENTIAL/PLATELET
Absolute Lymphocytes: 1672 {cells}/uL (ref 850–3900)
Absolute Monocytes: 496 {cells}/uL (ref 200–950)
Basophils Absolute: 29 {cells}/uL (ref 0–200)
Basophils Relative: 0.4 %
Eosinophils Absolute: 190 {cells}/uL (ref 15–500)
Eosinophils Relative: 2.6 %
HCT: 38.8 % (ref 38.5–50.0)
Hemoglobin: 12.9 g/dL — ABNORMAL LOW (ref 13.2–17.1)
MCH: 31.1 pg (ref 27.0–33.0)
MCHC: 33.2 g/dL (ref 32.0–36.0)
MCV: 93.5 fL (ref 80.0–100.0)
MPV: 11 fL (ref 7.5–12.5)
Monocytes Relative: 6.8 %
Neutro Abs: 4913 {cells}/uL (ref 1500–7800)
Neutrophils Relative %: 67.3 %
Platelets: 183 10*3/uL (ref 140–400)
RBC: 4.15 10*6/uL — ABNORMAL LOW (ref 4.20–5.80)
RDW: 13.6 % (ref 11.0–15.0)
Total Lymphocyte: 22.9 %
WBC: 7.3 10*3/uL (ref 3.8–10.8)

## 2023-10-26 LAB — CRYPTOSPORIDIUM ANTIGEN, EIA
Specimen Quality:: ADEQUATE
micro Number:: 15826931

## 2023-10-26 LAB — GIARDIA ANTIGEN
MICRO NUMBER:: 15826546
RESULT:: NOT DETECTED
SPECIMEN QUALITY:: ADEQUATE

## 2023-10-26 LAB — GASTROINTESTINAL PATHOGEN PNL
CampyloBacter Group: NOT DETECTED
Norovirus GI/GII: NOT DETECTED
Rotavirus A: NOT DETECTED
Salmonella species: NOT DETECTED
Shiga Toxin 1: NOT DETECTED
Shiga Toxin 2: NOT DETECTED
Shigella Species: NOT DETECTED
Vibrio Group: NOT DETECTED
Yersinia enterocolitica: NOT DETECTED

## 2023-10-27 ENCOUNTER — Telehealth: Payer: Self-pay | Admitting: Nurse Practitioner

## 2023-10-27 ENCOUNTER — Other Ambulatory Visit: Payer: Self-pay

## 2023-10-27 MED ORDER — DICYCLOMINE HCL 20 MG PO TABS: 20.0000 mg | ORAL_TABLET | Freq: Three times a day (TID) | ORAL | 0 refills | Status: DC

## 2023-10-27 NOTE — Progress Notes (Signed)
Per Dr. Oneta Rack, prescribing Bentyl, 20mg  QID AC & at bedtime. Patient aware.

## 2023-10-27 NOTE — Telephone Encounter (Signed)
Patient wanted to tell Annabelle Harman he has had diarrhea and it is uncontrollable. I offered him an appointment, but he wanted to see what Annabelle Harman had to say first.

## 2023-10-28 ENCOUNTER — Encounter: Payer: Self-pay | Admitting: Nurse Practitioner

## 2023-11-03 NOTE — Progress Notes (Signed)
Assessment and Plan:  Tim Torres was seen today for follow-up.  Diagnoses and all orders for this visit:  Spontaneous bruising Will check CBC, PT/INR if normal can be from aspirin use and may need to decrease amount -     CBC with Differential/Platelet -     Protime-INR  Diarrhea, unspecified type Continue Bentyl and keep GI appointment 11/14/23  Essential hypertension - continue medications- metoprolol 50 mg BID, DASH diet, exercise and monitor at home. Call if greater than 130/80.        Further disposition pending results of labs. Discussed med's effects and SE's.   Over 30 minutes of exam, counseling, chart review, and critical decision making was performed.   No future appointments.   ------------------------------------------------------------------------------------------------------------------   HPI BP 124/78   Pulse 60   Temp 97.9 F (36.6 C)   Resp 16   Ht 6' (1.829 m)   Wt 247 lb 9.6 oz (112.3 kg)   SpO2 98%   BMI 33.58 kg/m  72 y.o.male presents for reevaluation of diarrhea- all stool cultures  have been negative.  Currently on Bentyl before meals and at bedtime and diarrhea has improved somewhat.  He is having some formed stools.If he does not take Bentyl regularly diarrhea returns.  He has appointment with GI for 11/14/23   BP well controlled with Metoprolol 50 mg BID BP Readings from Last 3 Encounters:  11/04/23 124/78  10/20/23 132/68  08/23/23 (!) 144/72  Denies headaches, chest pain, shortness of breath and dizziness   BMI is Body mass index is 33.58 kg/m., he has been working on diet and exercise. Wt Readings from Last 3 Encounters:  11/04/23 247 lb 9.6 oz (112.3 kg)  10/20/23 247 lb 12.8 oz (112.4 kg)  08/23/23 254 lb 6.4 oz (115.4 kg)   He does have a large bruise lower abdomen and has no idea how he got it.  He does take a regular aspirin daily  Past Medical History:  Diagnosis Date   Arthritis    COVID-19 10/21/2019   Diabetes  mellitus without complication (HCC)    Hypertension    Myocardial infarction [facility]) 1997   per cardiology records, MI 11/16/96, s/p PTCS distal CX   Sleep apnea    per patient "does not wear CPAP, unable to tolerate"     Allergies  Allergen Reactions   Shellfish Allergy Shortness Of Breath and Swelling   Atorvastatin Other (See Comments)    Myalgia    Rosuvastatin Other (See Comments)    myalgia    Current Outpatient Medications on File Prior to Visit  Medication Sig   acetaminophen (TYLENOL) 500 MG tablet Take 2 tablets (1,000 mg total) by mouth every 8 (eight) hours as needed for mild pain or moderate pain.   allopurinol (ZYLOPRIM) 300 MG tablet Take 300 mg by mouth daily.   aspirin 325 MG tablet Take 325 mg by mouth daily.   dicyclomine (BENTYL) 20 MG tablet Take 1 tablet (20 mg total) by mouth 4 (four) times daily -  before meals and at bedtime.   lovastatin (MEVACOR) 40 MG tablet Take 40 mg by mouth 2 (two) times daily.    meclizine (ANTIVERT) 25 MG tablet Take 1 tablet (25 mg total) by mouth 3 (three) times daily as needed for dizziness or nausea.   metFORMIN (GLUCOPHAGE-XR) 500 MG 24 hr tablet Take 500-1,000 mg by mouth See admin instructions. Take 500 mg in the morning and 1000 mg in the evening   metoprolol tartrate (LOPRESSOR)  50 MG tablet Take 50 mg by mouth 2 (two) times daily.    tiZANidine (ZANAFLEX) 4 MG capsule Take 4 mg by mouth daily as needed for muscle spasms.   zolpidem (AMBIEN) 5 MG tablet Take 5 mg by mouth at bedtime as needed for sleep.    sildenafil (VIAGRA) 100 MG tablet Take 100 mg by mouth as needed for erectile dysfunction. (Patient not taking: Reported on 08/23/2023)   No current facility-administered medications on file prior to visit.    ROS: all negative except above.   Physical Exam:  BP 124/78   Pulse 60   Temp 97.9 F (36.6 C)   Resp 16   Ht 6' (1.829 m)   Wt 247 lb 9.6 oz (112.3 kg)   SpO2 98%   BMI 33.58 kg/m   General Appearance:  Well nourished, in no apparent distress. Eyes: PERRLA, EOMs, conjunctiva no swelling or erythema Sinuses: No Frontal/maxillary tenderness Neck: Supple, thyroid normal.  Respiratory: Respiratory effort normal, BS equal bilaterally without rales, rhonchi, wheezing or stridor.  Cardio: RRR with no MRGs. Brisk peripheral pulses without edema.  Abdomen: Soft, + BS.  Non tender, no guarding, rebound, hernias, masses. Lymphatics: Non tender without lymphadenopathy.  Musculoskeletal: Full ROM, 5/5 strength, normal gait.  Skin: Warm, dry . Large ecchymosis lower abdomen Neuro: Cranial nerves intact. Normal muscle tone, no cerebellar symptoms. Sensation intact.  Psych: Awake and oriented X 3, normal affect, Insight and Judgment appropriate.     Raynelle Dick, NP 11:12 AM [facility]

## 2023-11-04 ENCOUNTER — Encounter: Payer: Self-pay | Admitting: Nurse Practitioner

## 2023-11-04 ENCOUNTER — Ambulatory Visit (INDEPENDENT_AMBULATORY_CARE_PROVIDER_SITE_OTHER): Payer: Medicare HMO | Admitting: Nurse Practitioner

## 2023-11-04 VITALS — BP 124/78 | HR 60 | Temp 97.9°F | Resp 16 | Ht 72.0 in | Wt 247.6 lb

## 2023-11-04 DIAGNOSIS — R197 Diarrhea, unspecified: Secondary | ICD-10-CM | POA: Diagnosis not present

## 2023-11-04 DIAGNOSIS — I10 Essential (primary) hypertension: Secondary | ICD-10-CM | POA: Diagnosis not present

## 2023-11-04 DIAGNOSIS — R233 Spontaneous ecchymoses: Secondary | ICD-10-CM

## 2023-11-04 NOTE — Patient Instructions (Signed)
Contusion A contusion is a deep bruise. This is a result of an injury that causes bleeding under the skin. Symptoms of bruising include pain, swelling, and discolored skin. The skin may turn blue, purple, or yellow. Follow these instructions at home: Managing pain, stiffness, and swelling You may use RICE. This stands for: Resting. Icing. Compression, or putting pressure on the injured area. Elevating, or raising the injured area. To follow this method, do these actions: Rest the injured area. If told, put ice on the injured area. To do this: Put ice in a plastic bag. Place a towel between your skin and the bag. Leave the ice on for 20 minutes, 2-3 times per day. If your skin turns bright red, take off the ice right away to prevent skin damage. The risk of skin damage is higher if you cannot feel pain, heat, or cold. If told, apply compression on the injured area using an elastic bandage. Make sure the bandage is not too tight. If the area tingles or has a loss of feeling (numbness), remove it and put it back on as told by your doctor. If possible, elevate the injured area above the level of your heart while you are sitting or lying down.  General instructions Take over-the-counter and prescription medicines only as told by your doctor. Keep all follow-up visits. Your doctor may want to see how your contusion is healing with treatment. Contact a doctor if: Your symptoms do not get better after several days of treatment. Your symptoms get worse. You have trouble moving the injured area. Get help right away if: You have very bad pain. You have a loss of feeling (numbness) in a hand or foot. Your hand or foot turns pale or cold. This information is not intended to replace advice given to you by your health care provider. Make sure you discuss any questions you have with your health care provider. Document Revised: 04/19/2022 Document Reviewed: 04/19/2022 Elsevier Patient Education  2024  [employer].

## 2023-11-05 LAB — CBC WITH DIFFERENTIAL/PLATELET
Absolute Lymphocytes: 1670 {cells}/uL (ref 850–3900)
Absolute Monocytes: 485 {cells}/uL (ref 200–950)
Basophils Absolute: 29 {cells}/uL (ref 0–200)
Basophils Relative: 0.5 %
Eosinophils Absolute: 211 {cells}/uL (ref 15–500)
Eosinophils Relative: 3.7 %
HCT: 37.9 % — ABNORMAL LOW (ref 38.5–50.0)
Hemoglobin: 12.5 g/dL — ABNORMAL LOW (ref 13.2–17.1)
MCH: 31.4 pg (ref 27.0–33.0)
MCHC: 33 g/dL (ref 32.0–36.0)
MCV: 95.2 fL (ref 80.0–100.0)
MPV: 11.3 fL (ref 7.5–12.5)
Monocytes Relative: 8.5 %
Neutro Abs: 3306 {cells}/uL (ref 1500–7800)
Neutrophils Relative %: 58 %
Platelets: 152 10*3/uL (ref 140–400)
RBC: 3.98 10*6/uL — ABNORMAL LOW (ref 4.20–5.80)
RDW: 13.6 % (ref 11.0–15.0)
Total Lymphocyte: 29.3 %
WBC: 5.7 10*3/uL (ref 3.8–10.8)

## 2023-11-05 LAB — PROTIME-INR
INR: 1
Prothrombin Time: 10.7 s (ref 9.0–11.5)

## 2023-11-12 ENCOUNTER — Other Ambulatory Visit: Payer: Self-pay | Admitting: Internal Medicine

## 2023-11-12 MED ORDER — DEXAMETHASONE 4 MG PO TABS: ORAL_TABLET | ORAL | 0 refills | Status: DC

## 2023-11-30 ENCOUNTER — Other Ambulatory Visit: Payer: Self-pay | Admitting: Family Medicine

## 2023-12-01 ENCOUNTER — Other Ambulatory Visit: Payer: Self-pay | Admitting: Internal Medicine

## 2024-01-02 LAB — HM DIABETES EYE EXAM

## 2024-01-03 ENCOUNTER — Encounter: Payer: Self-pay | Admitting: Internal Medicine

## 2024-01-22 ENCOUNTER — Encounter: Payer: Self-pay | Admitting: Internal Medicine

## 2024-01-22 DIAGNOSIS — N1832 Chronic kidney disease, stage 3b: Secondary | ICD-10-CM | POA: Insufficient documentation

## 2024-01-22 DIAGNOSIS — E538 Deficiency of other specified B group vitamins: Secondary | ICD-10-CM | POA: Insufficient documentation

## 2024-01-22 DIAGNOSIS — G4700 Insomnia, unspecified: Secondary | ICD-10-CM | POA: Insufficient documentation

## 2024-01-22 DIAGNOSIS — N183 Chronic kidney disease, stage 3 unspecified: Secondary | ICD-10-CM | POA: Insufficient documentation

## 2024-01-22 NOTE — Patient Instructions (Addendum)
      Blood work was ordered.       Medications changes include :   increase amlodipine to 7.5 mg daily     Return in about 6 months (around 07/25/2024) for follow up.

## 2024-01-22 NOTE — Progress Notes (Unsigned)
 Subjective:    Patient ID: Tim Torres, male    DOB: 1951/07/04, 73 y.o.   MRN: [id number]     HPI Tim Torres is here for follow up of his chronic medical problems.  Has ha couple of   R > L foot pain   Walks dogs.    Medications and allergies reviewed with patient and updated if appropriate.  Current Outpatient Medications on File Prior to Visit  Medication Sig Dispense Refill   allopurinol (ZYLOPRIM) 300 MG tablet Take 300 mg by mouth daily.     aspirin 325 MG tablet Take 325 mg by mouth daily.     Cholecalciferol (VITAMIN D-3 PO) Take by mouth.     Cyanocobalamin (VITAMIN B-12 PO) Take by mouth.     dicyclomine (BENTYL) 20 MG tablet Take 1 tablet (20 mg total) by mouth 4 (four) times daily -  before meals and at bedtime. 100 tablet 0   lovastatin (MEVACOR) 40 MG tablet TAKE TWO TABLETS NIGHTLY 180 tablet 1   MAGNESIUM PO Take by mouth.     meclizine (ANTIVERT) 25 MG tablet Take 1 tablet (25 mg total) by mouth 3 (three) times daily as needed for dizziness or nausea. 30 tablet 0   metFORMIN (GLUCOPHAGE-XR) 500 MG 24 hr tablet Take 500-1,000 mg by mouth See admin instructions. Take 500 mg in the morning and 1000 mg in the evening     metoprolol tartrate (LOPRESSOR) 50 MG tablet Take 50 mg by mouth 2 (two) times daily.      tiZANidine (ZANAFLEX) 4 MG capsule Take 4 mg by mouth daily as needed for muscle spasms.     zolpidem (AMBIEN) 5 MG tablet Take 5 mg by mouth at bedtime as needed for sleep.      No current facility-administered medications on file prior to visit.     Review of Systems  Constitutional:  Negative for fever.  Respiratory:  Negative for cough, shortness of breath and wheezing.   Cardiovascular:  Positive for leg swelling (feet swell when he is on them long periods). Negative for chest pain and palpitations.  Gastrointestinal:  Positive for diarrhea (intermittent - not now). Negative for abdominal pain, blood in stool and constipation.       No  gerd  Genitourinary:  Positive for decreased urine volume.  Musculoskeletal:  Positive for arthralgias and back pain.  Neurological:  Positive for numbness (feet). Negative for light-headedness and headaches.  Psychiatric/Behavioral:  Positive for sleep disturbance. Negative for dysphoric mood. The patient is not nervous/anxious.        Objective:   Vitals:   01/23/24 1102  BP: 128/72  Pulse: (!) 59  Temp: 97.6 F (36.4 C)  SpO2: 98%   BP Readings from Last 3 Encounters:  01/23/24 128/72  11/04/23 124/78  10/20/23 132/68   Wt Readings from Last 3 Encounters:  01/23/24 250 lb (113.4 kg)  11/04/23 247 lb 9.6 oz (112.3 kg)  10/20/23 247 lb 12.8 oz (112.4 kg)   Body mass index is 33.91 kg/m.    Physical Exam Constitutional:      General: He is not in acute distress.    Appearance: Normal appearance. He is not ill-appearing.  HENT:     Head: Normocephalic and atraumatic.  Eyes:     Conjunctiva/sclera: Conjunctivae normal.  Cardiovascular:     Rate and Rhythm: Normal rate and regular rhythm.     Heart sounds: Normal heart sounds.  Pulmonary:  Effort: Pulmonary effort is normal. No respiratory distress.     Breath sounds: Normal breath sounds. No wheezing or rales.  Abdominal:     General: There is no distension.     Palpations: Abdomen is soft.     Tenderness: There is no abdominal tenderness.     Hernia: A hernia (ventral) is present.  Musculoskeletal:     Right lower leg: No edema.     Left lower leg: No edema.  Skin:    General: Skin is warm and dry.     Findings: No rash.  Neurological:     Mental Status: He is alert. Mental status is at baseline.  Psychiatric:        Mood and Affect: Mood normal.        Lab Results  Component Value Date   WBC 5.7 11/04/2023   HGB 12.5 (L) 11/04/2023   HCT 37.9 (L) 11/04/2023   PLT 152 11/04/2023   GLUCOSE 147 (H) 10/20/2023   CHOL 149 08/23/2023   TRIG 299 (H) 08/23/2023   HDL 39 (L) 08/23/2023   LDLCALC 74  08/23/2023   ALT 18 10/20/2023   AST 20 10/20/2023   NA 138 10/20/2023   K 4.5 10/20/2023   CL 106 10/20/2023   CREATININE 1.66 (H) 10/20/2023   BUN 35 (H) 10/20/2023   CO2 20 10/20/2023   INR 1.0 11/04/2023   HGBA1C 7.0 (H) 08/23/2023     Assessment & Plan:    See Problem List for Assessment and Plan of chronic medical problems.

## 2024-01-23 ENCOUNTER — Encounter: Payer: Self-pay | Admitting: Internal Medicine

## 2024-01-23 ENCOUNTER — Ambulatory Visit (INDEPENDENT_AMBULATORY_CARE_PROVIDER_SITE_OTHER): Payer: Medicare HMO | Admitting: Internal Medicine

## 2024-01-23 VITALS — BP 128/72 | HR 59 | Temp 97.6°F | Ht 72.0 in | Wt 250.0 lb

## 2024-01-23 DIAGNOSIS — M5442 Lumbago with sciatica, left side: Secondary | ICD-10-CM

## 2024-01-23 DIAGNOSIS — I25118 Atherosclerotic heart disease of native coronary artery with other forms of angina pectoris: Secondary | ICD-10-CM

## 2024-01-23 DIAGNOSIS — N1831 Chronic kidney disease, stage 3a: Secondary | ICD-10-CM

## 2024-01-23 DIAGNOSIS — E782 Mixed hyperlipidemia: Secondary | ICD-10-CM | POA: Diagnosis not present

## 2024-01-23 DIAGNOSIS — E119 Type 2 diabetes mellitus without complications: Secondary | ICD-10-CM

## 2024-01-23 DIAGNOSIS — E538 Deficiency of other specified B group vitamins: Secondary | ICD-10-CM | POA: Diagnosis not present

## 2024-01-23 DIAGNOSIS — I10 Essential (primary) hypertension: Secondary | ICD-10-CM | POA: Diagnosis not present

## 2024-01-23 DIAGNOSIS — G4709 Other insomnia: Secondary | ICD-10-CM

## 2024-01-23 DIAGNOSIS — M1A9XX0 Chronic gout, unspecified, without tophus (tophi): Secondary | ICD-10-CM

## 2024-01-23 DIAGNOSIS — K439 Ventral hernia without obstruction or gangrene: Secondary | ICD-10-CM | POA: Insufficient documentation

## 2024-01-23 DIAGNOSIS — M5441 Lumbago with sciatica, right side: Secondary | ICD-10-CM

## 2024-01-23 DIAGNOSIS — G8929 Other chronic pain: Secondary | ICD-10-CM | POA: Insufficient documentation

## 2024-01-23 LAB — CBC WITH DIFFERENTIAL/PLATELET
Basophils Absolute: 0 10*3/uL (ref 0.0–0.1)
Basophils Relative: 0.5 % (ref 0.0–3.0)
Eosinophils Absolute: 0.3 10*3/uL (ref 0.0–0.7)
Eosinophils Relative: 4.4 % (ref 0.0–5.0)
HCT: 39.9 % (ref 39.0–52.0)
Hemoglobin: 13.2 g/dL (ref 13.0–17.0)
Lymphocytes Relative: 33.9 % (ref 12.0–46.0)
Lymphs Abs: 2.1 10*3/uL (ref 0.7–4.0)
MCHC: 33.2 g/dL (ref 30.0–36.0)
MCV: 95 fl (ref 78.0–100.0)
Monocytes Absolute: 0.5 10*3/uL (ref 0.1–1.0)
Monocytes Relative: 7.4 % (ref 3.0–12.0)
Neutro Abs: 3.3 10*3/uL (ref 1.4–7.7)
Neutrophils Relative %: 53.8 % (ref 43.0–77.0)
Platelets: 118 10*3/uL — ABNORMAL LOW (ref 150.0–400.0)
RBC: 4.2 Mil/uL — ABNORMAL LOW (ref 4.22–5.81)
RDW: 14.3 % (ref 11.5–15.5)
WBC: 6.1 10*3/uL (ref 4.0–10.5)

## 2024-01-23 LAB — MICROALBUMIN / CREATININE URINE RATIO
Creatinine,U: 98.2 mg/dL
Microalb Creat Ratio: 28.7 mg/g (ref 0.0–30.0)
Microalb, Ur: 2.8 mg/dL — ABNORMAL HIGH (ref 0.0–1.9)

## 2024-01-23 LAB — COMPREHENSIVE METABOLIC PANEL
ALT: 17 U/L (ref 0–53)
AST: 19 U/L (ref 0–37)
Albumin: 4.5 g/dL (ref 3.5–5.2)
Alkaline Phosphatase: 67 U/L (ref 39–117)
BUN: 26 mg/dL — ABNORMAL HIGH (ref 6–23)
CO2: 27 meq/L (ref 19–32)
Calcium: 10 mg/dL (ref 8.4–10.5)
Chloride: 106 meq/L (ref 96–112)
Creatinine, Ser: 1.36 mg/dL (ref 0.40–1.50)
GFR: 52.5 mL/min — ABNORMAL LOW (ref >60.00)
Glucose, Bld: 114 mg/dL — ABNORMAL HIGH (ref 70–99)
Potassium: 5.3 meq/L — ABNORMAL HIGH (ref 3.5–5.1)
Sodium: 139 meq/L (ref 135–145)
Total Bilirubin: 0.5 mg/dL (ref 0.2–1.2)
Total Protein: 7.4 g/dL (ref 6.0–8.3)

## 2024-01-23 LAB — LIPID PANEL
Cholesterol: 143 mg/dL (ref 0–200)
HDL: 40.4 mg/dL (ref >39.00)
LDL Cholesterol: 48 mg/dL (ref 0–99)
NonHDL: 102.65
Total CHOL/HDL Ratio: 4
Triglycerides: 271 mg/dL — ABNORMAL HIGH (ref 0.0–149.0)
VLDL: 54.2 mg/dL — ABNORMAL HIGH (ref 0.0–40.0)

## 2024-01-23 LAB — HEMOGLOBIN A1C: Hgb A1c MFr Bld: 7.1 % — ABNORMAL HIGH (ref 4.6–6.5)

## 2024-01-23 LAB — VITAMIN B12: Vitamin B-12: 316 pg/mL (ref 211–911)

## 2024-01-23 MED ORDER — ALLOPURINOL 300 MG PO TABS: 300.0000 mg | ORAL_TABLET | Freq: Every day | ORAL | 2 refills | Status: DC

## 2024-01-23 NOTE — Assessment & Plan Note (Signed)
 Chronic Following with cardiology Denies angina or angina equivalent Continue metoprolol 50 mg bid, lovastatin 40 mg daily, ASA 325 mg daily

## 2024-01-23 NOTE — Assessment & Plan Note (Signed)
 Chronic Check lipid panel  Continue lovastatin 40 mg daily Regular exercise and healthy diet encouraged

## 2024-01-23 NOTE — Assessment & Plan Note (Signed)
Chronic ?Controlled, stable ?Continue ambien 5 mg nightly ? ?

## 2024-01-23 NOTE — Assessment & Plan Note (Signed)
 Chronic  Lab Results  Component Value Date   HGBA1C 7.0 (H) 08/23/2023    Check A1c, urine microalbumin today Continue Metformin xr 500 mg am, 1000 mg pm Stressed regular exercise, diabetic diet

## 2024-01-23 NOTE — Assessment & Plan Note (Signed)
Chronic ?Check B12 level ?

## 2024-01-23 NOTE — Assessment & Plan Note (Signed)
Chronic BP well controlled Continue metoprolol 50 mg bid cmp

## 2024-01-23 NOTE — Assessment & Plan Note (Signed)
 Chronic GFR stable Check cmp, cbc BP well controlled Check a1c

## 2024-01-23 NOTE — Assessment & Plan Note (Signed)
 Chronic With radiculopathy Has herniated disc S/p surgery x 2 2020, 2021 - Dr Dutch Quint Having EMG next month Taking tylenol

## 2024-01-23 NOTE — Assessment & Plan Note (Signed)
 Chronic Controlled, stable No gout flares in years Continue allopurinol 300 mg daily

## 2024-01-24 ENCOUNTER — Encounter: Payer: Self-pay | Admitting: Internal Medicine

## 2024-02-07 ENCOUNTER — Encounter: Payer: Medicare HMO | Admitting: Internal Medicine

## 2024-02-07 ENCOUNTER — Other Ambulatory Visit: Payer: Self-pay | Admitting: Internal Medicine

## 2024-02-07 NOTE — Telephone Encounter (Signed)
 Copied from CRM [id number]. Topic: Clinical - Medication Refill >> Feb 07, 2024 11:46 AM Bo Mcclintock wrote: Most Recent Primary Care Visit:  Provider: BURNS, Bobette Mo  Department: [facility]  Visit Type: NEW PATIENT  Date: 01/23/2024  Medication: zolpidem (AMBIEN) 5 MG tablet  Has the patient contacted their pharmacy? No (Agent: If no, request that the patient contact the pharmacy for the refill. If patient does not wish to contact the pharmacy document the reason why and proceed with request.) (Agent: If yes, when and what did the pharmacy advise?) No refills available   Is this the correct pharmacy for this prescription? Yes If no, delete pharmacy and type the correct one.  This is the patient's preferred pharmacy:  [facility] [place], [place] - [address] [place] [address] Phone: [phone] Fax: [phone]   Has the prescription been filled recently? No  Is the patient out of the medication? Yes  Has the patient been seen for an appointment in the last year OR does the patient have an upcoming appointment? Yes  Can we respond through MyChart? No  Agent: Please be advised that Rx refills may take up to 3 business days. We ask that you follow-up with your pharmacy.

## 2024-02-08 ENCOUNTER — Other Ambulatory Visit: Payer: Self-pay | Admitting: Internal Medicine

## 2024-02-08 NOTE — Telephone Encounter (Signed)
 Duplicate. Please see refill request in chart dated today. [employer] sent refill request today to [facility] Ref[facility] for zolpidem, metoprolol and metformin. Closing this encounter.

## 2024-02-08 NOTE — Telephone Encounter (Signed)
This will be 1st fill with you

## 2024-02-16 ENCOUNTER — Ambulatory Visit: Payer: Medicare HMO | Admitting: Family Medicine

## 2024-03-20 DIAGNOSIS — Z6833 Body mass index (BMI) 33.0-33.9, adult: Secondary | ICD-10-CM | POA: Diagnosis not present

## 2024-03-20 DIAGNOSIS — M5416 Radiculopathy, lumbar region: Secondary | ICD-10-CM | POA: Diagnosis not present

## 2024-03-30 DIAGNOSIS — G5731 Lesion of lateral popliteal nerve, right lower limb: Secondary | ICD-10-CM | POA: Diagnosis not present

## 2024-04-12 ENCOUNTER — Inpatient Hospital Stay (HOSPITAL_COMMUNITY)

## 2024-04-12 ENCOUNTER — Inpatient Hospital Stay (HOSPITAL_COMMUNITY)
Admission: AD | Admit: 2024-04-12 | Discharge: 2024-04-22 | DRG: 862 | Disposition: A | Discharge status: Home Health | Source: Ambulatory Visit | Attending: Neurosurgery | Admitting: Neurosurgery

## 2024-04-12 ENCOUNTER — Encounter (HOSPITAL_COMMUNITY): Payer: Self-pay

## 2024-04-12 DIAGNOSIS — R748 Abnormal levels of other serum enzymes: Secondary | ICD-10-CM | POA: Diagnosis not present

## 2024-04-12 DIAGNOSIS — I252 Old myocardial infarction: Secondary | ICD-10-CM | POA: Diagnosis not present

## 2024-04-12 DIAGNOSIS — Z79899 Other long term (current) drug therapy: Secondary | ICD-10-CM | POA: Diagnosis not present

## 2024-04-12 DIAGNOSIS — I129 Hypertensive chronic kidney disease with stage 1 through stage 4 chronic kidney disease, or unspecified chronic kidney disease: Secondary | ICD-10-CM | POA: Diagnosis present

## 2024-04-12 DIAGNOSIS — Z8249 Family history of ischemic heart disease and other diseases of the circulatory system: Secondary | ICD-10-CM

## 2024-04-12 DIAGNOSIS — B957 Other staphylococcus as the cause of diseases classified elsewhere: Secondary | ICD-10-CM | POA: Diagnosis not present

## 2024-04-12 DIAGNOSIS — Z8616 Personal history of COVID-19: Secondary | ICD-10-CM | POA: Diagnosis not present

## 2024-04-12 DIAGNOSIS — T8141XA Infection following a procedure, superficial incisional surgical site, initial encounter: Principal | ICD-10-CM | POA: Diagnosis present

## 2024-04-12 DIAGNOSIS — E785 Hyperlipidemia, unspecified: Secondary | ICD-10-CM | POA: Diagnosis present

## 2024-04-12 DIAGNOSIS — L03115 Cellulitis of right lower limb: Secondary | ICD-10-CM | POA: Diagnosis not present

## 2024-04-12 DIAGNOSIS — Z96642 Presence of left artificial hip joint: Secondary | ICD-10-CM | POA: Diagnosis present

## 2024-04-12 DIAGNOSIS — Z91013 Allergy to seafood: Secondary | ICD-10-CM | POA: Diagnosis not present

## 2024-04-12 DIAGNOSIS — E66811 Obesity, class 1: Secondary | ICD-10-CM | POA: Diagnosis present

## 2024-04-12 DIAGNOSIS — R630 Anorexia: Secondary | ICD-10-CM | POA: Diagnosis present

## 2024-04-12 DIAGNOSIS — N1832 Chronic kidney disease, stage 3b: Secondary | ICD-10-CM | POA: Diagnosis present

## 2024-04-12 DIAGNOSIS — D649 Anemia, unspecified: Secondary | ICD-10-CM | POA: Diagnosis present

## 2024-04-12 DIAGNOSIS — M25461 Effusion, right knee: Secondary | ICD-10-CM | POA: Diagnosis not present

## 2024-04-12 DIAGNOSIS — K8021 Calculus of gallbladder without cholecystitis with obstruction: Secondary | ICD-10-CM | POA: Diagnosis present

## 2024-04-12 DIAGNOSIS — E875 Hyperkalemia: Secondary | ICD-10-CM | POA: Diagnosis present

## 2024-04-12 DIAGNOSIS — E871 Hypo-osmolality and hyponatremia: Secondary | ICD-10-CM | POA: Diagnosis present

## 2024-04-12 DIAGNOSIS — R600 Localized edema: Secondary | ICD-10-CM | POA: Diagnosis not present

## 2024-04-12 DIAGNOSIS — E1169 Type 2 diabetes mellitus with other specified complication: Secondary | ICD-10-CM | POA: Diagnosis present

## 2024-04-12 DIAGNOSIS — E1122 Type 2 diabetes mellitus with diabetic chronic kidney disease: Secondary | ICD-10-CM | POA: Diagnosis not present

## 2024-04-12 DIAGNOSIS — Z7984 Long term (current) use of oral hypoglycemic drugs: Secondary | ICD-10-CM | POA: Diagnosis not present

## 2024-04-12 DIAGNOSIS — Z87891 Personal history of nicotine dependence: Secondary | ICD-10-CM | POA: Diagnosis not present

## 2024-04-12 DIAGNOSIS — M25561 Pain in right knee: Secondary | ICD-10-CM | POA: Diagnosis not present

## 2024-04-12 DIAGNOSIS — M1711 Unilateral primary osteoarthritis, right knee: Secondary | ICD-10-CM | POA: Diagnosis not present

## 2024-04-12 DIAGNOSIS — I2510 Atherosclerotic heart disease of native coronary artery without angina pectoris: Secondary | ICD-10-CM | POA: Diagnosis present

## 2024-04-12 DIAGNOSIS — Z888 Allergy status to other drugs, medicaments and biological substances status: Secondary | ICD-10-CM | POA: Diagnosis not present

## 2024-04-12 DIAGNOSIS — E119 Type 2 diabetes mellitus without complications: Secondary | ICD-10-CM | POA: Diagnosis not present

## 2024-04-12 DIAGNOSIS — Z7982 Long term (current) use of aspirin: Secondary | ICD-10-CM | POA: Diagnosis not present

## 2024-04-12 DIAGNOSIS — T8149XA Infection following a procedure, other surgical site, initial encounter: Principal | ICD-10-CM | POA: Diagnosis present

## 2024-04-12 DIAGNOSIS — M542 Cervicalgia: Secondary | ICD-10-CM | POA: Diagnosis present

## 2024-04-12 DIAGNOSIS — Z6832 Body mass index (BMI) 32.0-32.9, adult: Secondary | ICD-10-CM

## 2024-04-12 DIAGNOSIS — K8020 Calculus of gallbladder without cholecystitis without obstruction: Secondary | ICD-10-CM | POA: Diagnosis not present

## 2024-04-12 DIAGNOSIS — J029 Acute pharyngitis, unspecified: Secondary | ICD-10-CM | POA: Diagnosis not present

## 2024-04-12 DIAGNOSIS — I10 Essential (primary) hypertension: Secondary | ICD-10-CM | POA: Diagnosis present

## 2024-04-12 DIAGNOSIS — N179 Acute kidney failure, unspecified: Secondary | ICD-10-CM | POA: Diagnosis present

## 2024-04-12 DIAGNOSIS — E1159 Type 2 diabetes mellitus with other circulatory complications: Secondary | ICD-10-CM | POA: Diagnosis present

## 2024-04-12 DIAGNOSIS — R7401 Elevation of levels of liver transaminase levels: Secondary | ICD-10-CM | POA: Diagnosis not present

## 2024-04-12 DIAGNOSIS — K828 Other specified diseases of gallbladder: Secondary | ICD-10-CM | POA: Diagnosis not present

## 2024-04-12 DIAGNOSIS — D72829 Elevated white blood cell count, unspecified: Secondary | ICD-10-CM | POA: Diagnosis not present

## 2024-04-12 DIAGNOSIS — E1100 Type 2 diabetes mellitus with hyperosmolarity without nonketotic hyperglycemic-hyperosmolar coma (NKHHC): Secondary | ICD-10-CM | POA: Diagnosis present

## 2024-04-12 DIAGNOSIS — Z9889 Other specified postprocedural states: Secondary | ICD-10-CM | POA: Diagnosis not present

## 2024-04-12 DIAGNOSIS — N1831 Chronic kidney disease, stage 3a: Secondary | ICD-10-CM | POA: Diagnosis present

## 2024-04-12 DIAGNOSIS — N183 Chronic kidney disease, stage 3 unspecified: Secondary | ICD-10-CM | POA: Diagnosis present

## 2024-04-12 DIAGNOSIS — A419 Sepsis, unspecified organism: Secondary | ICD-10-CM

## 2024-04-12 LAB — BASIC METABOLIC PANEL WITH GFR
Anion gap: 13 (ref 5–15)
BUN: 29 mg/dL — ABNORMAL HIGH (ref 8–23)
CO2: 21 mmol/L — ABNORMAL LOW (ref 22–32)
Calcium: 9.6 mg/dL (ref 8.9–10.3)
Chloride: 100 mmol/L (ref 98–111)
Creatinine, Ser: 1.69 mg/dL — ABNORMAL HIGH (ref 0.61–1.24)
GFR, Estimated: 43 mL/min — ABNORMAL LOW (ref >60)
Glucose, Bld: 222 mg/dL — ABNORMAL HIGH (ref 70–99)
Potassium: 4.5 mmol/L (ref 3.5–5.1)
Sodium: 134 mmol/L — ABNORMAL LOW (ref 135–145)

## 2024-04-12 LAB — CBC WITH DIFFERENTIAL/PLATELET
Abs Immature Granulocytes: 0.04 10*3/uL (ref 0.00–0.07)
Basophils Absolute: 0 10*3/uL (ref 0.0–0.1)
Basophils Relative: 0 %
Eosinophils Absolute: 0 10*3/uL (ref 0.0–0.5)
Eosinophils Relative: 0 %
HCT: 39 % (ref 39.0–52.0)
Hemoglobin: 13 g/dL (ref 13.0–17.0)
Immature Granulocytes: 0 %
Lymphocytes Relative: 12 %
Lymphs Abs: 1.4 10*3/uL (ref 0.7–4.0)
MCH: 31 pg (ref 26.0–34.0)
MCHC: 33.3 g/dL (ref 30.0–36.0)
MCV: 92.9 fL (ref 80.0–100.0)
Monocytes Absolute: 0.9 10*3/uL (ref 0.1–1.0)
Monocytes Relative: 8 %
Neutro Abs: 8.7 10*3/uL — ABNORMAL HIGH (ref 1.7–7.7)
Neutrophils Relative %: 80 %
Platelets: 185 10*3/uL (ref 150–400)
RBC: 4.2 MIL/uL — ABNORMAL LOW (ref 4.22–5.81)
RDW: 13.9 % (ref 11.5–15.5)
WBC: 11 10*3/uL — ABNORMAL HIGH (ref 4.0–10.5)
nRBC: 0 % (ref 0.0–0.2)

## 2024-04-12 MED ORDER — ACETAMINOPHEN 650 MG RE SUPP: 650.0000 mg | Freq: Four times a day (QID) | RECTAL | Status: DC | PRN

## 2024-04-12 MED ORDER — ONDANSETRON HCL 4 MG/2ML IJ SOLN: 4.0000 mg | Freq: Four times a day (QID) | INTRAMUSCULAR | Status: DC | PRN

## 2024-04-12 MED ORDER — ONDANSETRON HCL 4 MG PO TABS: 4.0000 mg | ORAL_TABLET | Freq: Four times a day (QID) | ORAL | Status: DC | PRN

## 2024-04-12 MED ORDER — DOCUSATE SODIUM 100 MG PO CAPS
100.0000 mg | ORAL_CAPSULE | Freq: Two times a day (BID) | ORAL | Status: DC
  Administered 2024-04-13 – 2024-04-22 (×17): 100 mg via ORAL
  Filled 2024-04-12 (×19): qty 1

## 2024-04-12 MED ORDER — BISACODYL 10 MG RE SUPP: 10.0000 mg | Freq: Every day | RECTAL | Status: DC | PRN

## 2024-04-12 MED ORDER — VITAMIN B-12 1000 MCG PO TABS
500.0000 ug | ORAL_TABLET | Freq: Every day | ORAL | Status: DC
  Administered 2024-04-13 – 2024-04-22 (×10): 500 ug via ORAL
  Filled 2024-04-12 (×10): qty 1

## 2024-04-12 MED ORDER — ZOLPIDEM TARTRATE 5 MG PO TABS
5.0000 mg | ORAL_TABLET | Freq: Every evening | ORAL | Status: DC | PRN
  Administered 2024-04-13 – 2024-04-18 (×6): 5 mg via ORAL
  Filled 2024-04-12 (×6): qty 1

## 2024-04-12 MED ORDER — HYDRALAZINE HCL 20 MG/ML IJ SOLN: 5.0000 mg | INTRAMUSCULAR | Status: DC | PRN

## 2024-04-12 MED ORDER — VITAMIN D 25 MCG (1000 UNIT) PO TABS
1000.0000 [IU] | ORAL_TABLET | Freq: Every day | ORAL | Status: DC
  Administered 2024-04-13 – 2024-04-22 (×10): 1000 [IU] via ORAL
  Filled 2024-04-12 (×10): qty 1

## 2024-04-12 MED ORDER — ALLOPURINOL 100 MG PO TABS
300.0000 mg | ORAL_TABLET | Freq: Every day | ORAL | Status: DC
  Administered 2024-04-13 – 2024-04-22 (×10): 300 mg via ORAL
  Filled 2024-04-12 (×10): qty 3

## 2024-04-12 MED ORDER — POLYETHYLENE GLYCOL 3350 17 G PO PACK
17.0000 g | PACK | Freq: Every day | ORAL | Status: DC | PRN
  Filled 2024-04-12: qty 1

## 2024-04-12 MED ORDER — MECLIZINE HCL 25 MG PO TABS: 25.0000 mg | ORAL_TABLET | Freq: Three times a day (TID) | ORAL | Status: DC | PRN

## 2024-04-12 MED ORDER — HYDROCODONE-ACETAMINOPHEN 5-325 MG PO TABS
1.0000 | ORAL_TABLET | ORAL | Status: DC | PRN
  Administered 2024-04-13 (×3): 1 via ORAL
  Filled 2024-04-12 (×3): qty 1

## 2024-04-12 MED ORDER — SODIUM CHLORIDE 0.9 % IV SOLN
2.0000 g | INTRAVENOUS | Status: DC
  Administered 2024-04-12 – 2024-04-16 (×5): 2 g via INTRAVENOUS
  Filled 2024-04-12 (×5): qty 20

## 2024-04-12 MED ORDER — CYCLOBENZAPRINE HCL 10 MG PO TABS
5.0000 mg | ORAL_TABLET | Freq: Three times a day (TID) | ORAL | Status: DC | PRN
  Administered 2024-04-17 – 2024-04-21 (×7): 5 mg via ORAL
  Filled 2024-04-12 (×9): qty 1

## 2024-04-12 MED ORDER — HYDROMORPHONE HCL 1 MG/ML IJ SOLN
0.5000 mg | INTRAMUSCULAR | Status: DC | PRN
  Administered 2024-04-12: 0.5 mg via INTRAVENOUS
  Administered 2024-04-12 – 2024-04-21 (×8): 1 mg via INTRAVENOUS
  Filled 2024-04-12 (×11): qty 1

## 2024-04-12 MED ORDER — MAGNESIUM OXIDE -MG SUPPLEMENT 400 (240 MG) MG PO TABS
400.0000 mg | ORAL_TABLET | Freq: Every day | ORAL | Status: DC
  Administered 2024-04-13 – 2024-04-21 (×9): 400 mg via ORAL
  Filled 2024-04-12 (×9): qty 1

## 2024-04-12 MED ORDER — METFORMIN HCL ER 500 MG PO TB24
500.0000 mg | ORAL_TABLET | Freq: Every day | ORAL | Status: DC
  Administered 2024-04-13 – 2024-04-18 (×6): 500 mg via ORAL
  Filled 2024-04-12 (×6): qty 1

## 2024-04-12 MED ORDER — ORAL CARE MOUTH RINSE: 15.0000 mL | OROMUCOSAL | Status: DC | PRN

## 2024-04-12 MED ORDER — DICYCLOMINE HCL 20 MG PO TABS: 20.0000 mg | ORAL_TABLET | Freq: Three times a day (TID) | ORAL | Status: DC

## 2024-04-12 MED ORDER — PRAVASTATIN SODIUM 40 MG PO TABS
80.0000 mg | ORAL_TABLET | Freq: Every day | ORAL | Status: DC
  Administered 2024-04-14 – 2024-04-22 (×8): 80 mg via ORAL
  Filled 2024-04-12 (×10): qty 2

## 2024-04-12 MED ORDER — FLEET ENEMA RE ENEM: 1.0000 | ENEMA | Freq: Once | RECTAL | Status: DC | PRN

## 2024-04-12 MED ORDER — METOPROLOL TARTRATE 50 MG PO TABS
50.0000 mg | ORAL_TABLET | Freq: Two times a day (BID) | ORAL | Status: DC
  Administered 2024-04-13 – 2024-04-22 (×19): 50 mg via ORAL
  Filled 2024-04-12 (×19): qty 1

## 2024-04-12 MED ORDER — ACETAMINOPHEN 325 MG PO TABS
650.0000 mg | ORAL_TABLET | Freq: Four times a day (QID) | ORAL | Status: DC | PRN
  Administered 2024-04-13 – 2024-04-15 (×4): 650 mg via ORAL
  Filled 2024-04-12 (×6): qty 2

## 2024-04-12 NOTE — Plan of Care (Signed)

## 2024-04-12 NOTE — Anesthesia Preprocedure Evaluation (Signed)
 Anesthesia Evaluation    Reviewed: Allergy & Precautions, Patient's Chart, lab work & pertinent test results  Airway        Dental   Pulmonary sleep apnea , former smoker          Cardiovascular hypertension, Pt. on home beta blockers and Pt. on medications + CAD and + Past MI (1997)       Neuro/Psych    GI/Hepatic   Endo/Other  diabetes, Type 2, Oral Hypoglycemic Agents    Renal/GU      Musculoskeletal  (+) Arthritis ,    Abdominal   Peds  Hematology   Anesthesia Other Findings S/P R peroneal n. decompression 03/30/2024  All: atorvastatin, Rosuvastatin  Reproductive/Obstetrics                             Anesthesia Physical Anesthesia Plan  ASA: 2  Anesthesia Plan: General   Post-op Pain Management: Toradol  IV (intra-op)*   Induction: Intravenous  PONV Risk Score and Plan: 3 and Treatment may vary due to age or medical condition, Midazolam  and Ondansetron   Airway Management Planned: LMA  Additional Equipment: None  Intra-op Plan:   Post-operative Plan:   Informed Consent:      Dental advisory given  Plan Discussed with: CRNA and Surgeon  Anesthesia Plan Comments:        Anesthesia Quick Evaluation

## 2024-04-12 NOTE — H&P (Signed)
 Tim Torres is an 73 y.o. male.   Chief Complaint: Wound Infection HPI:  [name] underwent a right peroneal nerve decompression by Dr. Adonis Alamin on 03/30/2024.  He did well for the 1st 4 days following surgery.  He noticed increased pain and drainage from his wound starting about 5 days after surgery.  The pain, drainage, and swelling has continued to increase since that time.  He reports chills and a T max of about 99. He rates his pain an 8/10 presently.  Past Medical History:  Diagnosis Date   Arthritis    COVID-19 10/21/2019   Diabetes mellitus without complication (HCC)    Hypertension    Myocardial infarction [name]) 1997   per cardiology records, MI 11/16/96, s/p PTCS distal CX   Sleep apnea    per patient "does not wear CPAP, unable to tolerate"    Past Surgical History:  Procedure Laterality Date   ANGIOPLASTY     per patient "back in 1997"   BACK SURGERY     TOTAL HIP ARTHROPLASTY Left 07/20/2022   Procedure: TOTAL HIP ARTHROPLASTY ANTERIOR APPROACH;  Surgeon: Saundra Curl, MD;  Location: [facility];  Service: Orthopedics;  Laterality: Left;    No family history on file. Social History:  reports that he quit smoking about 41 years ago. His smoking use included cigarettes. He has never used smokeless tobacco. He reports current alcohol  use of about 1.0 standard drink of alcohol  per week. He reports that he does not use drugs.  Allergies:  Allergies  Allergen Reactions   Shellfish Allergy Shortness Of Breath and Swelling   Atorvastatin Other (See Comments)    Myalgia    Rosuvastatin Other (See Comments)    myalgia    Medications Prior to Admission  Medication Sig Dispense Refill   allopurinol  (ZYLOPRIM ) 300 MG tablet Take 1 tablet (300 mg total) by mouth daily. 90 tablet 2   aspirin  325 MG tablet Take 325 mg by mouth daily.     Cholecalciferol  (VITAMIN D -3 PO) Take by mouth.     Cyanocobalamin  (VITAMIN B-12 PO) Take by mouth.     dicyclomine  (BENTYL ) 20 MG tablet  Take 1 tablet (20 mg total) by mouth 4 (four) times daily -  before meals and at bedtime. 100 tablet 0   lovastatin (MEVACOR) 40 MG tablet TAKE TWO TABLETS NIGHTLY 180 tablet 1   MAGNESIUM  PO Take by mouth.     meclizine  (ANTIVERT ) 25 MG tablet Take 1 tablet (25 mg total) by mouth 3 (three) times daily as needed for dizziness or nausea. 30 tablet 0   metFORMIN  (GLUCOPHAGE -XR) 500 MG 24 hr tablet TAKE ONE TABLET EVERY MORNING AND TWO AT BEDTIME 270 tablet 1   metoprolol  tartrate (LOPRESSOR ) 50 MG tablet TAKE ONE TABLET TWICE DAILY 180 tablet 1   zolpidem  (AMBIEN ) 5 MG tablet TAKE ONE TABLET AT BEDTIME AS NEEDED FOR SLEEP 30 tablet 1    No results found for this or any previous visit (from the past 48 hours). No results found.  Review of Systems  Constitutional:  Positive for activity change, chills and fever.  HENT: Negative.    Eyes: Negative.   Respiratory: Negative.    Cardiovascular: Negative.   Gastrointestinal: Negative.   Endocrine: Negative.   Genitourinary: Negative.   Musculoskeletal:  Positive for gait problem.  Skin:  Positive for wound.  Allergic/Immunologic: Negative.   Neurological:  Positive for numbness.  Hematological: Negative.   Psychiatric/Behavioral: Negative.      There  were no vitals taken for this visit. Physical Exam Constitutional:      Appearance: Normal appearance.  HENT:     Head: Normocephalic and atraumatic.     Nose: Nose normal.  Eyes:     Extraocular Movements: Extraocular movements intact.     Pupils: Pupils are equal, round, and reactive to light.  Cardiovascular:     Pulses: Normal pulses.  Pulmonary:     Effort: Pulmonary effort is normal.  Abdominal:     Palpations: Abdomen is soft.  Musculoskeletal:        General: Tenderness present. Normal range of motion.     Cervical back: Normal range of motion and neck supple.     Right lower leg: Swelling and tenderness present.       Legs:  Skin:    General: Skin is warm and dry.      Capillary Refill: Capillary refill takes less than 2 seconds.  Neurological:     General: No focal deficit present.     Mental Status: He is alert and oriented to person, place, and time.  Psychiatric:        Mood and Affect: Mood normal.        Behavior: Behavior normal.        Thought Content: Thought content normal.        Judgment: Judgment normal.      Assessment/Plan Tim Torres will be admitted for IV antibiotics and a wound incision and drainage tomorrow. Antibiotics should be started today after his blood cultures are drawn. He should be made NPO at midnight.  [name], NP 04/12/2024, 5:14 PM

## 2024-04-13 ENCOUNTER — Encounter (HOSPITAL_COMMUNITY)
Admission: AD | Disposition: A | Discharge status: Home Health | Payer: Self-pay | Source: Ambulatory Visit | Attending: Neurosurgery

## 2024-04-13 ENCOUNTER — Encounter (HOSPITAL_COMMUNITY): Payer: Self-pay | Admitting: Anesthesiology

## 2024-04-13 SURGERY — DEBRIDEMENT, WOUND
Anesthesia: Choice | Laterality: Right

## 2024-04-13 MED ORDER — LIDOCAINE-EPINEPHRINE 1 %-1:100000 IJ SOLN
INTRAMUSCULAR | Status: AC
  Filled 2024-04-13: qty 1

## 2024-04-13 MED ORDER — BUPIVACAINE HCL (PF) 0.5 % IJ SOLN
INTRAMUSCULAR | Status: AC
  Filled 2024-04-13: qty 30

## 2024-04-13 MED ORDER — THROMBIN 5000 UNITS EX KIT
PACK | CUTANEOUS | Status: AC
Start: 2024-04-13 — End: ?
  Filled 2024-04-13: qty 1

## 2024-04-13 NOTE — TOC Initial Note (Signed)
 Transition of Care [facility]) - Initial/Assessment Note    Patient Details  Name: Tim Torres MRN: [id number] Date of Birth: 1950-12-23  [facility]) CM/SW Contact:    Jonathan Neighbor, RN Phone Number: 04/13/2024, 10:09 AM  Clinical Narrative:                  PCP: Dedrick Fanti Pt is from home with his significant other. They are together most of the time.  Pt manages his own medications and denies any issues.  Pt drives but SO can provide transportation if needed.  TOC following for d/c needs.   Expected Discharge Plan: Home/Self Care Barriers to Discharge: Continued Medical Work up   Patient Goals and CMS Choice            Expected Discharge Plan and Services   Discharge Planning Services: CM Consult   Living arrangements for the past 2 months: Single Family Home                                      Prior Living Arrangements/Services Living arrangements for the past 2 months: Single Family Home Lives with:: Significant Other Patient language and need for interpreter reviewed:: Yes Do you feel safe going back to the place where you live?: Yes        Care giver support system in place?: Yes (comment) Current home services: DME (cane/ crutches) Criminal Activity/Legal Involvement Pertinent to Current Situation/Hospitalization: No - Comment as needed  Activities of Daily Living      Permission Sought/Granted                  Emotional Assessment Appearance:: Appears stated age Attitude/Demeanor/Rapport: Engaged Affect (typically observed): Accepting Orientation: : Oriented to Self, Oriented to Place, Oriented to  Time, Oriented to Situation   Psych Involvement: No (comment)  Admission diagnosis:  Postoperative wound infection [T81.49XA] Patient Active Problem List   Diagnosis Date Noted   Postoperative wound infection 04/12/2024   Chronic back pain 01/23/2024   Ventral hernia without obstruction or gangrene 01/23/2024   CKD  (chronic kidney disease) stage 3, GFR 30-59 ml/min (HCC) 01/22/2024   Insomnia 01/22/2024   Low serum vitamin B12 01/22/2024   S/P total left hip arthroplasty, 2023 07/20/2022   Degenerative spondylolisthesis 06/27/2020   DJD (degenerative joint disease) 09/24/2019   Dizziness 09/23/2019   HTN (hypertension) 09/23/2019   HLD (hyperlipidemia) 09/23/2019   CAD (coronary artery disease) 09/23/2019   DM2 (diabetes mellitus, type 2) (HCC) 09/23/2019   Gout 07/30/2013   PCP:  Colene Dauphin, MD Pharmacy:   [facility] [place], [place] - [address] [place] [address] Phone: [phone] Fax: [phone]  [facility] [phone] - [place], [place] - [address] [place] [address] Phone: [phone] Fax: [phone]     Social Drivers of Health (SDOH) Social History: SDOH Screenings   Food Insecurity: No Food Insecurity (04/12/2024)  Housing: Low Risk  (04/12/2024)  Transportation Needs: No Transportation Needs (07/20/2022)  Utilities: Not At Risk (07/20/2022)  Depression (PHQ2-9): Low Risk  (01/23/2024)  Social Connections: Unknown (03/30/2022)   Received from [facility]  Tobacco Use: Medium Risk (01/23/2024)   SDOH Interventions:     Readmission Risk Interventions     No data to display

## 2024-04-13 NOTE — Progress Notes (Addendum)
   Providing Compassionate, Quality Care - Together   Subjective: Patient reports fever overnight. Right leg still painful.  Objective: Vital signs in last 24 hours: Temp:  [98.7 F (37.1 C)-103.2 F (39.6 C)] 102.4 F (39.1 C) (05/30 0535) Pulse Rate:  [89-119] 119 (05/30 0354) Resp:  [21] 21 (05/29 1720) BP: (113-143)/(52-71) 130/64 (05/30 0354) SpO2:  [95 %-96 %] 95 % (05/30 0354) Weight:  [110.2 kg] 110.2 kg (05/29 1720)  Intake/Output from previous day: No intake/output data recorded. Intake/Output this shift: No intake/output data recorded.  Alert and oriented x 4 PERRLA CN II-XII grossly intact MAE, Strength and sensation intact Incision is covered with Mepilex and Vaseline gauze. Peri wound area with less edema and denuded incision area with less drainage this morning. Still erythematous.   Lab Results: Recent Labs    04/12/24 1810  WBC 11.0*  HGB 13.0  HCT 39.0  PLT 185   BMET Recent Labs    04/12/24 1810  NA 134*  K 4.5  CL 100  CO2 21*  GLUCOSE 222*  BUN 29*  CREATININE 1.69*  CALCIUM 9.6    Studies/Results: US  RT LOWER EXTREM LTD SOFT TISSUE NON VASCULAR Result Date: 04/12/2024 CLINICAL DATA:  Postop wound infection EXAM: ULTRASOUND right LOWER EXTREMITY LIMITED TECHNIQUE: Ultrasound examination of the lower extremity soft tissues was performed in the area of clinical concern. COMPARISON:  None Available. FINDINGS: Targeted ultrasound in the area clinical concern inferior to the right knee about the incision demonstrates loosely organized subcutaneous edema. Mild associated hyperemia. No circumscribed fluid collection. IMPRESSION: Loosely organized subcutaneous edema about the incision in the right calf. This may be postoperative or due to cellulitis/phlegmon. No drainable fluid collection. Electronically Signed   By: Rozell Cornet M.D.   On: 04/12/2024 22:40    Assessment/Plan: Patient is s/p peroneal nerve decompression with postoperative  wound infection. Ultrasound of RLE did not show a definitive fluid collection.   LOS: 1 day   -Continue IV abx. -Will likely hold off on wound wash out this afternoon as patient appears to be responding well to abx.  I am in communication with my attending and they agree with the plan for this patient.   Henreitta Locus, DNP, AGNP-C Nurse Practitioner  [facility] [address], [place], [place] [address] P: [phone]    F: [phone]  04/13/2024, 8:06 AM     ADDENDUM: I have seen and examined Tim Torres. He reports improvement in right leg swelling. Similar right leg pain. U/S yesterday does not reveal any subcutaneous fluid collection/abscess. We will therefore cont to treat with IV abx rather than proceeding with operative wound washout. If he continues to improve with IV abx over the next few days, can consider transition to oral abx and d/c home.  Augusto Blonder, MD [facility]

## 2024-04-14 LAB — BLOOD CULTURE ID PANEL (REFLEXED) - BCID2

## 2024-04-14 MED ORDER — OXYCODONE-ACETAMINOPHEN 5-325 MG PO TABS
1.0000 | ORAL_TABLET | ORAL | Status: DC | PRN
  Administered 2024-04-14 – 2024-04-16 (×3): 2 via ORAL
  Administered 2024-04-16 – 2024-04-18 (×6): 1 via ORAL
  Administered 2024-04-21 – 2024-04-22 (×4): 2 via ORAL
  Filled 2024-04-14: qty 1
  Filled 2024-04-14 (×3): qty 2
  Filled 2024-04-14 (×3): qty 1
  Filled 2024-04-14 (×2): qty 2
  Filled 2024-04-14: qty 1
  Filled 2024-04-14: qty 2
  Filled 2024-04-14: qty 1
  Filled 2024-04-14: qty 2

## 2024-04-14 MED ORDER — GABAPENTIN 300 MG PO CAPS
300.0000 mg | ORAL_CAPSULE | Freq: Three times a day (TID) | ORAL | Status: DC
  Administered 2024-04-14 – 2024-04-22 (×26): 300 mg via ORAL
  Filled 2024-04-14 (×26): qty 1

## 2024-04-14 NOTE — Plan of Care (Signed)
  Problem: Education: Goal: Knowledge of General Education information will improve Description: Including pain rating scale, medication(s)/side effects and non-pharmacologic comfort measures Outcome: Progressing   Problem: Health Behavior/Discharge Planning: Goal: Ability to manage health-related needs will improve Outcome: Progressing   Problem: Clinical Measurements: Goal: Ability to maintain clinical measurements within normal limits will improve Outcome: Progressing Goal: Respiratory complications will improve Outcome: Progressing Goal: Cardiovascular complication will be avoided Outcome: Progressing   Problem: Elimination: Goal: Will not experience complications related to urinary retention Outcome: Progressing   Problem: Clinical Measurements: Goal: Will remain free from infection Outcome: Not Progressing   Problem: Pain Managment: Goal: General experience of comfort will improve and/or be controlled Outcome: Not Progressing

## 2024-04-14 NOTE — Progress Notes (Signed)
 PHARMACY - PHYSICIAN COMMUNICATION CRITICAL VALUE ALERT - BLOOD CULTURE IDENTIFICATION (BCID)  Tim Torres is an 73 y.o. male who presented to [facility] on 04/12/2024 with a chief complaint of postop wound  Assessment:  likely contaminant (include suspected source if known)  Name of physician (or Provider) Contacted: NSGY paging service  Current antibiotics: ceftriaxone   Changes to prescribed antibiotics recommended:  Patient is on recommended antibiotics - No changes needed  Results for orders placed or performed during the hospital encounter of 04/12/24  Blood Culture ID Panel (Reflexed) (Collected: 04/12/2024  6:10 PM)  Result Value Ref Range   Enterococcus faecalis NOT DETECTED NOT DETECTED   Enterococcus Faecium NOT DETECTED NOT DETECTED   Listeria monocytogenes NOT DETECTED NOT DETECTED   Staphylococcus species DETECTED (A) NOT DETECTED   Staphylococcus aureus (BCID) NOT DETECTED NOT DETECTED   Staphylococcus epidermidis NOT DETECTED NOT DETECTED   Staphylococcus lugdunensis NOT DETECTED NOT DETECTED   Streptococcus species NOT DETECTED NOT DETECTED   Streptococcus agalactiae NOT DETECTED NOT DETECTED   Streptococcus pneumoniae NOT DETECTED NOT DETECTED   Streptococcus pyogenes NOT DETECTED NOT DETECTED   A.calcoaceticus-baumannii NOT DETECTED NOT DETECTED   Bacteroides fragilis NOT DETECTED NOT DETECTED   Enterobacterales NOT DETECTED NOT DETECTED   Enterobacter cloacae complex NOT DETECTED NOT DETECTED   Escherichia coli NOT DETECTED NOT DETECTED   Klebsiella aerogenes NOT DETECTED NOT DETECTED   Klebsiella oxytoca NOT DETECTED NOT DETECTED   Klebsiella pneumoniae NOT DETECTED NOT DETECTED   Proteus species NOT DETECTED NOT DETECTED   Salmonella species NOT DETECTED NOT DETECTED   Serratia marcescens NOT DETECTED NOT DETECTED   Haemophilus influenzae NOT DETECTED NOT DETECTED   Neisseria meningitidis NOT DETECTED NOT DETECTED   Pseudomonas aeruginosa NOT  DETECTED NOT DETECTED   Stenotrophomonas maltophilia NOT DETECTED NOT DETECTED   Candida albicans NOT DETECTED NOT DETECTED   Candida auris NOT DETECTED NOT DETECTED   Candida glabrata NOT DETECTED NOT DETECTED   Candida krusei NOT DETECTED NOT DETECTED   Candida parapsilosis NOT DETECTED NOT DETECTED   Candida tropicalis NOT DETECTED NOT DETECTED   Cryptococcus neoformans/gattii NOT DETECTED NOT DETECTED    Sheron Dixons 04/14/2024  8:28 PM

## 2024-04-14 NOTE — Progress Notes (Signed)
 NEUROSURGERY PROGRESS NOTE  Having a hard time with pain control. Patient seems to be very frustrated. Incision is stable, continue abx for now. Will adjust pain medications  Temp:  [98.8 F (37.1 C)-102.5 F (39.2 C)] 98.8 F (37.1 C) (05/31 0300) Pulse Rate:  [72-103] 72 (05/31 0300) Resp:  [16-17] 16 (05/30 1505) BP: (106-140)/(64-78) 106/78 (05/31 0300) SpO2:  [94 %-99 %] 94 % (05/31 0300)     Jeannette Mills, NP 04/14/2024 8:58 AM

## 2024-04-15 MED ORDER — PHENOL 1.4 % MT LIQD
1.0000 | OROMUCOSAL | Status: DC | PRN
  Filled 2024-04-15: qty 177

## 2024-04-15 NOTE — Progress Notes (Signed)
 Patient reports having undiagnosed Sleep Apena and that has lead to him having a very sore throat doing his stay here due to not being able to lie on his side to sleep. Message hospitalitis for some throat lozengers per patient's request.

## 2024-04-15 NOTE — Plan of Care (Signed)

## 2024-04-15 NOTE — Plan of Care (Signed)
  Problem: Education: Goal: Knowledge of General Education information will improve Description: Including pain rating scale, medication(s)/side effects and non-pharmacologic comfort measures Outcome: Progressing   Problem: Clinical Measurements: Goal: Respiratory complications will improve Outcome: Progressing Goal: Cardiovascular complication will be avoided Outcome: Progressing   Problem: Elimination: Goal: Will not experience complications related to urinary retention Outcome: Progressing   Problem: Clinical Measurements: Goal: Ability to maintain clinical measurements within normal limits will improve Outcome: Not Progressing Goal: Will remain free from infection Outcome: Not Progressing   Problem: Activity: Goal: Risk for activity intolerance will decrease Outcome: Not Progressing   Problem: Pain Managment: Goal: General experience of comfort will improve and/or be controlled Outcome: Not Progressing   Problem: Skin Integrity: Goal: Risk for impaired skin integrity will decrease Outcome: Not Progressing

## 2024-04-15 NOTE — Progress Notes (Signed)
 NEUROSURGERY PROGRESS NOTE  Doing better today from a pain standpoint after medication adjustments. I am going to consult ID for their input on abx therapy   Temp:  [98 F (36.7 C)-103.1 F (39.5 C)] 98.6 F (37 C) (06/01 0516) Pulse Rate:  [67-110] 67 (06/01 0516) Resp:  [16-18] 18 (06/01 0516) BP: (131-150)/(70-76) 139/75 (06/01 0516) SpO2:  [81 %-100 %] 100 % (06/01 0516) FiO2 (%):  [21 %] 21 % (05/31 0903)    Jeannette Mills, NP 04/15/2024 8:41 AM

## 2024-04-16 ENCOUNTER — Encounter (HOSPITAL_COMMUNITY): Payer: Self-pay | Admitting: Neurosurgery

## 2024-04-16 ENCOUNTER — Inpatient Hospital Stay (HOSPITAL_COMMUNITY)

## 2024-04-16 ENCOUNTER — Other Ambulatory Visit: Payer: Self-pay

## 2024-04-16 DIAGNOSIS — D72829 Elevated white blood cell count, unspecified: Secondary | ICD-10-CM | POA: Diagnosis not present

## 2024-04-16 DIAGNOSIS — B957 Other staphylococcus as the cause of diseases classified elsewhere: Secondary | ICD-10-CM | POA: Diagnosis not present

## 2024-04-16 DIAGNOSIS — T8149XA Infection following a procedure, other surgical site, initial encounter: Secondary | ICD-10-CM | POA: Diagnosis not present

## 2024-04-16 LAB — CULTURE, BLOOD (ROUTINE X 2)

## 2024-04-16 LAB — COMPREHENSIVE METABOLIC PANEL WITH GFR
ALT: 167 U/L — ABNORMAL HIGH (ref 0–44)
AST: 188 U/L — ABNORMAL HIGH (ref 15–41)
Albumin: 2.7 g/dL — ABNORMAL LOW (ref 3.5–5.0)
Alkaline Phosphatase: 164 U/L — ABNORMAL HIGH (ref 38–126)
Anion gap: 11 (ref 5–15)
BUN: 35 mg/dL — ABNORMAL HIGH (ref 8–23)
CO2: 26 mmol/L (ref 22–32)
Calcium: 9.2 mg/dL (ref 8.9–10.3)
Chloride: 94 mmol/L — ABNORMAL LOW (ref 98–111)
Creatinine, Ser: 1.54 mg/dL — ABNORMAL HIGH (ref 0.61–1.24)
GFR, Estimated: 48 mL/min — ABNORMAL LOW (ref >60)
Glucose, Bld: 386 mg/dL — ABNORMAL HIGH (ref 70–99)
Potassium: 4.9 mmol/L (ref 3.5–5.1)
Sodium: 131 mmol/L — ABNORMAL LOW (ref 135–145)
Total Bilirubin: 0.6 mg/dL (ref 0.0–1.2)
Total Protein: 6.9 g/dL (ref 6.5–8.1)

## 2024-04-16 MED ORDER — LINEZOLID 600 MG PO TABS
600.0000 mg | ORAL_TABLET | Freq: Two times a day (BID) | ORAL | Status: DC
  Administered 2024-04-16 – 2024-04-22 (×13): 600 mg via ORAL
  Filled 2024-04-16 (×14): qty 1

## 2024-04-16 MED ORDER — CALCIUM CARBONATE ANTACID 500 MG PO CHEW
2.0000 | CHEWABLE_TABLET | Freq: Three times a day (TID) | ORAL | Status: DC | PRN
  Administered 2024-04-16 – 2024-04-17 (×2): 400 mg via ORAL
  Filled 2024-04-16 (×2): qty 2

## 2024-04-16 MED ORDER — PANTOPRAZOLE SODIUM 40 MG PO TBEC
40.0000 mg | DELAYED_RELEASE_TABLET | Freq: Every day | ORAL | Status: DC
  Administered 2024-04-17 – 2024-04-22 (×6): 40 mg via ORAL
  Filled 2024-04-16 (×6): qty 1

## 2024-04-16 MED ORDER — IOHEXOL 350 MG/ML SOLN
75.0000 mL | Freq: Once | INTRAVENOUS | Status: AC | PRN
  Administered 2024-04-16: 75 mL via INTRAVENOUS

## 2024-04-16 NOTE — Progress Notes (Signed)
 I am returning from vacation and assuming care of Mr. Hardenbrook.  He is recently status post right-sided peroneal nerve decompression from which he initially did well.  He developed increasing pain and swelling around his incision with history of fevers.  He was evaluated in the office and found to have evidence of a significant superficial wound infection.  Cultures were not obtained from the wound.  He was started on empiric Rocephin  which he seems to be responding to.  The patient has much less swelling.  His pain is improved.  The erythema around his wound has improved.  He continues to have some low-grade fever.  The patient is status post superficial nerve decompressive surgery.  He appears to be responding to his current antibiotic regimen.  I think cultures of his wound at this point are likely to be limited value.  He is currently being evaluated by infectious disease and I will await their recommendations which will be followed closely.

## 2024-04-16 NOTE — Plan of Care (Signed)

## 2024-04-16 NOTE — Consult Note (Signed)
 [facility]    Date of Admission:  04/12/2024     Total days of antibiotics 5               Reason for Consult: Post-operative infection   Referring Provider: Henreitta Locus, NP Primary Care Provider: Colene Dauphin, MD   ASSESSMENT:  [name] is a 73 year old Caucasian gentleman with recent history of right peroneal nerve decompression on 03/30/2024 admitted with concern for postoperative wound infection with ultrasound showing no evidence of drainable collection.  Currently on day 5 of antimicrobial therapy with ceftriaxone .  Will obtain imaging to rule out underlying infection.  Blood culture with 1/4 Staphylococcus capitis which is likely a contaminate. Continue current dose of ceftriaxone  and add linezolid for MRSA coverage.  Encouraging that his fever curve appears to be downtrending slowly.  Continue wound care per neurosurgery.  Standard/universal precautions.  PLAN:  Continue current dose of ceftriaxone  and add linezolid for MRSA coverage. Obtain imaging to rule out underlying infection. Post-operative wound care per Neurosurgery Universal/Standard Precautions.  Remaining medical and supportive care per Primary Team.    Principal Problem:   Postoperative wound infection    allopurinol   300 mg Oral Daily   cholecalciferol   1,000 Units Oral Daily   cyanocobalamin   500 mcg Oral Daily   docusate sodium   100 mg Oral BID   gabapentin   300 mg Oral TID   linezolid  600 mg Oral Q12H   magnesium  oxide  400 mg Oral QHS   metFORMIN   500 mg Oral Q breakfast   metoprolol  tartrate  50 mg Oral BID   pravastatin   80 mg Oral q1800     HPI: Tim Torres is a 73 y.o. male with previous medical history of type 2 diabetes without complication, hypertension, and sleep apnea admitted with concern for postoperative wound infection.  [name] initially underwent right peroneal nerve decompression on 03/30/2024.  Course was unremarkable until  approximately 4 days following surgery when he noticed increased pain and drainage from his wound.  Began having rigors and purulent, foul-smelling drainage.  Febrile with temperature of 103.2 F and leukocytosis of 11,000.  Soft tissue ultrasound with loosely organized subcutaneous edema around the incision in the right calf with no drainable fluid collection.  Initially started on ceftriaxone  and currently receiving day 5.  Blood cultures drawn on 04/12/2024 with Staphylococcus capitis in 1 out of 4 bottles.  ID has been asked for antibiotic recommendations.   Review of Systems: Review of Systems  Constitutional:  Negative for chills, fever and weight loss.  Respiratory:  Negative for cough, shortness of breath and wheezing.   Cardiovascular:  Negative for chest pain and leg swelling.  Gastrointestinal:  Negative for abdominal pain, constipation, diarrhea, nausea and vomiting.  Musculoskeletal:        Positive for right leg pain  Skin:  Negative for rash.     Past Medical History:  Diagnosis Date   Arthritis    COVID-19 10/21/2019   Diabetes mellitus without complication (HCC)    Hypertension    Myocardial infarction [facility]) 1997   per cardiology records, MI 11/16/96, s/p PTCS distal CX   Sleep apnea    per patient "does not wear CPAP, unable to tolerate"    Social History   Tobacco Use   Smoking status: Former    Current packs/day: 0.00    Types: Cigarettes    Quit date: 10/15/1982    Years since quitting: 41.5  Smokeless tobacco: Never  Vaping Use   Vaping status: Never Used  Substance Use Topics   Alcohol  use: Yes    Alcohol /week: 1.0 standard drink of alcohol     Types: 1 Cans of beer per week    Comment: "occasionallyy   Drug use: Never    History reviewed. No pertinent family history.  Allergies  Allergen Reactions   Shellfish Allergy Shortness Of Breath and Swelling   Atorvastatin Other (See Comments)    Myalgia    Rosuvastatin Other (See Comments)    myalgia     OBJECTIVE: Blood pressure 138/72, pulse 83, temperature (!) 97.4 F (36.3 C), temperature source Oral, resp. rate 19, height 6' (1.829 m), weight 110.2 kg, SpO2 98%.  Physical Exam Constitutional:      General: He is not in acute distress.    Appearance: He is well-developed.  Cardiovascular:     Rate and Rhythm: Normal rate and regular rhythm.     Heart sounds: Normal heart sounds.  Pulmonary:     Effort: Pulmonary effort is normal.     Breath sounds: Normal breath sounds.  Skin:    General: Skin is warm and dry.     Comments: Surgical incision is draining with edema and some effusion and warmth  Neurological:     Mental Status: He is alert.     Lab Results Lab Results  Component Value Date   WBC 11.0 (H) 04/12/2024   HGB 13.0 04/12/2024   HCT 39.0 04/12/2024   MCV 92.9 04/12/2024   PLT 185 04/12/2024    Lab Results  Component Value Date   CREATININE 1.69 (H) 04/12/2024   BUN 29 (H) 04/12/2024   NA 134 (L) 04/12/2024   K 4.5 04/12/2024   CL 100 04/12/2024   CO2 21 (L) 04/12/2024    Lab Results  Component Value Date   ALT 17 01/23/2024   AST 19 01/23/2024   ALKPHOS 67 01/23/2024   BILITOT 0.5 01/23/2024     Microbiology: Recent Results (from the past 240 hours)  Culture, blood (Routine X 2) w Reflex to ID Panel     Status: None (Preliminary result)   Collection Time: 04/12/24  6:10 PM   Specimen: BLOOD  Result Value Ref Range Status   Specimen Description BLOOD LEFT ANTECUBITAL  Final   Special Requests   Final    BOTTLES DRAWN AEROBIC AND ANAEROBIC Blood Culture results may not be optimal due to an inadequate volume of blood received in culture bottles   Culture   Final    NO GROWTH 4 DAYS Performed at [facility], [address], [place], [place] [address]    Report Status PENDING  Incomplete  Culture, blood (Routine X 2) w Reflex to ID Panel     Status: Abnormal   Collection Time: 04/12/24  6:10 PM   Specimen: BLOOD  Result Value Ref  Range Status   Specimen Description BLOOD RIGHT ANTECUBITAL  Final   Special Requests   Final    BOTTLES DRAWN AEROBIC AND ANAEROBIC Blood Culture results may not be optimal due to an inadequate volume of blood received in culture bottles   Culture  Setup Time   Final    GRAM POSITIVE COCCI ANAEROBIC BOTTLE ONLY CRITICAL RESULT CALLED TO, READ BACK BY AND VERIFIED WITH: PHARMD MICHAEL BITONTI [id number] AT 2030 BY EC    Culture (A)  Final    STAPHYLOCOCCUS CAPITIS THE SIGNIFICANCE OF ISOLATING THIS ORGANISM FROM A SINGLE SET  OF BLOOD CULTURES WHEN MULTIPLE SETS ARE DRAWN IS UNCERTAIN. PLEASE NOTIFY THE MICROBIOLOGY DEPARTMENT WITHIN ONE WEEK IF SPECIATION AND SENSITIVITIES ARE REQUIRED. Performed at [facility], [address] [place], [place] [address]    Report Status 04/16/2024 FINAL  Final  Blood Culture ID Panel (Reflexed)     Status: Abnormal   Collection Time: 04/12/24  6:10 PM  Result Value Ref Range Status   Enterococcus faecalis NOT DETECTED NOT DETECTED Final   Enterococcus Faecium NOT DETECTED NOT DETECTED Final   Listeria monocytogenes NOT DETECTED NOT DETECTED Final   Staphylococcus species DETECTED (A) NOT DETECTED Final    Comment: CRITICAL RESULT CALLED TO, READ BACK BY AND VERIFIED WITH: PHARMD MICHAEL BITONTI [id number] AT 2030 BY EC    Staphylococcus aureus (BCID) NOT DETECTED NOT DETECTED Final   Staphylococcus epidermidis NOT DETECTED NOT DETECTED Final   Staphylococcus lugdunensis NOT DETECTED NOT DETECTED Final   Streptococcus species NOT DETECTED NOT DETECTED Final   Streptococcus agalactiae NOT DETECTED NOT DETECTED Final   Streptococcus pneumoniae NOT DETECTED NOT DETECTED Final   Streptococcus pyogenes NOT DETECTED NOT DETECTED Final   A.calcoaceticus-baumannii NOT DETECTED NOT DETECTED Final   Bacteroides fragilis NOT DETECTED NOT DETECTED Final   Enterobacterales NOT DETECTED NOT DETECTED Final   Enterobacter cloacae complex NOT DETECTED NOT  DETECTED Final   Escherichia coli NOT DETECTED NOT DETECTED Final   Klebsiella aerogenes NOT DETECTED NOT DETECTED Final   Klebsiella oxytoca NOT DETECTED NOT DETECTED Final   Klebsiella pneumoniae NOT DETECTED NOT DETECTED Final   Proteus species NOT DETECTED NOT DETECTED Final   Salmonella species NOT DETECTED NOT DETECTED Final   Serratia marcescens NOT DETECTED NOT DETECTED Final   Haemophilus influenzae NOT DETECTED NOT DETECTED Final   Neisseria meningitidis NOT DETECTED NOT DETECTED Final   Pseudomonas aeruginosa NOT DETECTED NOT DETECTED Final   Stenotrophomonas maltophilia NOT DETECTED NOT DETECTED Final   Candida albicans NOT DETECTED NOT DETECTED Final   Candida auris NOT DETECTED NOT DETECTED Final   Candida glabrata NOT DETECTED NOT DETECTED Final   Candida krusei NOT DETECTED NOT DETECTED Final   Candida parapsilosis NOT DETECTED NOT DETECTED Final   Candida tropicalis NOT DETECTED NOT DETECTED Final   Cryptococcus neoformans/gattii NOT DETECTED NOT DETECTED Final    Comment: Performed at [facility], [address] [place], [place] [address]    I have personally spent 28 minutes involved in face-to-face and non-face-to-face activities for this patient on the day of the visit. Professional time spent includes the following activities: Preparing to see the patient (review of tests), Reviewing separately obtained history (admission/discharge record), Performing a medically appropriate examination and/or evaluation , Ordering medications/tests/procedures, referring and communicating with other health care professionals, Documenting clinical information in the EMR, Communicating results to the patient/family/caregiver, Counseling and educating the patient/family/caregiver and Care coordination (not separately reported).    Greg Martie Muhlbauer, NP [facility]  04/16/2024  2:55 PM

## 2024-04-17 DIAGNOSIS — D72829 Elevated white blood cell count, unspecified: Secondary | ICD-10-CM

## 2024-04-17 DIAGNOSIS — R748 Abnormal levels of other serum enzymes: Secondary | ICD-10-CM

## 2024-04-17 DIAGNOSIS — R7401 Elevation of levels of liver transaminase levels: Secondary | ICD-10-CM

## 2024-04-17 DIAGNOSIS — T8149XA Infection following a procedure, other surgical site, initial encounter: Secondary | ICD-10-CM | POA: Diagnosis not present

## 2024-04-17 DIAGNOSIS — J029 Acute pharyngitis, unspecified: Secondary | ICD-10-CM

## 2024-04-17 LAB — CBC WITH DIFFERENTIAL/PLATELET
Abs Immature Granulocytes: 0.07 10*3/uL (ref 0.00–0.07)
Basophils Absolute: 0 10*3/uL (ref 0.0–0.1)
Basophils Relative: 0 %
Eosinophils Absolute: 0.1 10*3/uL (ref 0.0–0.5)
Eosinophils Relative: 2 %
HCT: 35 % — ABNORMAL LOW (ref 39.0–52.0)
Hemoglobin: 11.6 g/dL — ABNORMAL LOW (ref 13.0–17.0)
Immature Granulocytes: 1 %
Lymphocytes Relative: 9 %
Lymphs Abs: 0.7 10*3/uL (ref 0.7–4.0)
MCH: 30.4 pg (ref 26.0–34.0)
MCHC: 33.1 g/dL (ref 30.0–36.0)
MCV: 91.6 fL (ref 80.0–100.0)
Monocytes Absolute: 0.4 10*3/uL (ref 0.1–1.0)
Monocytes Relative: 6 %
Neutro Abs: 6 10*3/uL (ref 1.7–7.7)
Neutrophils Relative %: 82 %
Platelets: 204 10*3/uL (ref 150–400)
RBC: 3.82 MIL/uL — ABNORMAL LOW (ref 4.22–5.81)
RDW: 13.4 % (ref 11.5–15.5)
WBC: 7.4 10*3/uL (ref 4.0–10.5)
nRBC: 0 % (ref 0.0–0.2)

## 2024-04-17 LAB — CULTURE, BLOOD (ROUTINE X 2): Culture: NO GROWTH

## 2024-04-17 LAB — HEPATITIS B SURFACE ANTIGEN: Hepatitis B Surface Ag: NONREACTIVE

## 2024-04-17 LAB — HEPATITIS C ANTIBODY: HCV Ab: NONREACTIVE

## 2024-04-17 MED ORDER — SODIUM CHLORIDE 0.9 % IV SOLN
2.0000 g | Freq: Two times a day (BID) | INTRAVENOUS | Status: DC
  Administered 2024-04-17 – 2024-04-19 (×5): 2 g via INTRAVENOUS
  Filled 2024-04-17 (×5): qty 12.5

## 2024-04-17 NOTE — TOC Progression Note (Signed)
 Transition of Care [facility]) - Progression Note    Patient Details  Name: Tim Torres MRN: [id number] Date of Birth: 1951-06-02  [facility] of [facility]) CM/SW Contact  Jonathan Neighbor, RN Phone Number: 04/17/2024, 1:27 PM  Clinical Narrative:     TOC following for d/c needs when medically ready for dc.   Expected Discharge Plan: Home/Self Care Barriers to Discharge: Continued Medical Work up  Expected Discharge Plan and Services   Discharge Planning Services: CM Consult   Living arrangements for the past 2 months: Single Family Home                                       Social Determinants of Health (SDOH) Interventions SDOH Screenings   Food Insecurity: No Food Insecurity (04/12/2024)  Housing: Low Risk  (04/12/2024)  Transportation Needs: No Transportation Needs (04/13/2024)  Utilities: Not At Risk (04/13/2024)  Alcohol  Screen: Low Risk  (04/16/2024)  Depression (PHQ2-9): Low Risk  (01/23/2024)  Financial Resource Strain: Low Risk  (04/16/2024)  Physical Activity: Inactive (04/16/2024)  Social Connections: Socially Integrated (04/13/2024)  Stress: No Stress Concern Present (04/16/2024)  Tobacco Use: Medium Risk (04/16/2024)  Health Literacy: Adequate Health Literacy (04/16/2024)    Readmission Risk Interventions     No data to display

## 2024-04-17 NOTE — Inpatient Diabetes Management (Signed)
 Inpatient Diabetes Program Recommendations  AACE/ADA: New Consensus Statement on Inpatient Glycemic Control (2015)  Target Ranges:  Prepandial:   less than 140 mg/dL      Peak postprandial:   less than 180 mg/dL (1-2 hours)      Critically ill patients:  140 - 180 mg/dL   Lab Results  Component Value Date   GLUCAP 150 (H) 07/20/2022   HGBA1C 7.1 (H) 01/23/2024    Review of Glycemic Control  Diabetes history: DM2 Outpatient Diabetes medications: metformin  500 in am and 1000 mg in pm Current orders for Inpatient glycemic control: metformin  500 in am  HgbA1C - 7.1% Good glucose control at home  Inpatient Diabetes Program Recommendations:    Consider adding Novolog  0-9 units TID with meals  Follow.  Thank you. Joni Net, RD, LDN, CDCES Inpatient Diabetes Coordinator [phone]

## 2024-04-17 NOTE — Progress Notes (Addendum)
 [facility]    Date of Admission:  04/12/2024   Total days of antibiotics 6/day 2 linezolid       ID: Tim Torres is a 73 y.o. male with  post operative soft skin tissue infection Principal Problem:   Postoperative wound infection    Subjective: Afebrile, still some discomfort to his right lower leg/wound. In addn, he reports sore throat  Medications:   allopurinol   300 mg Oral Daily   cholecalciferol   1,000 Units Oral Daily   cyanocobalamin   500 mcg Oral Daily   docusate sodium   100 mg Oral BID   gabapentin   300 mg Oral TID   linezolid  600 mg Oral Q12H   magnesium  oxide  400 mg Oral QHS   metFORMIN   500 mg Oral Q breakfast   metoprolol  tartrate  50 mg Oral BID   pantoprazole   40 mg Oral Daily   pravastatin   80 mg Oral q1800    Objective: Vital signs in last 24 hours: Temp:  [97.1 F (36.2 C)-98.2 F (36.8 C)] 97.1 F (36.2 C) (06/03 1235) Pulse Rate:  [74-93] 75 (06/03 1235) Resp:  [16-18] 18 (06/03 1235) BP: (132-162)/(70-81) 132/78 (06/03 1235) SpO2:  [96 %-99 %] 98 % (06/03 1235)  Physical Exam  Constitutional: He is oriented to person, place, and time. He appears well-developed and well-nourished. No distress.  HENT:  Mouth/Throat: Oropharynx is clear and moist. No oropharyngeal exudate.  Cardiovascular: Normal rate, regular rhythm and normal heart sounds. Exam reveals no gallop and no friction rub.  No murmur heard.  Pulmonary/Chest: Effort normal and breath sounds normal. No respiratory distress. He has no wheezes.  Neurological: He is alert and oriented to person, place, and time.  Skin:     Media Information   Document Information  Photos    04/17/2024 11:09  Attached To:  Hospital Encounter on 04/12/24  Source Information  Liane Redman, MD  [facility]  Document History    Lab Results Recent Labs    04/16/24 1631 04/17/24 1031  WBC  --  7.4  HGB  --  11.6*  HCT  --  35.0*  NA 131*   --   K 4.9  --   CL 94*  --   CO2 26  --   BUN 35*  --   CREATININE 1.54*  --    Liver Panel Recent Labs    04/16/24 1631  PROT 6.9  ALBUMIN 2.7*  AST 188*  ALT 167*  ALKPHOS 164*  BILITOT 0.6    Microbiology: reviewed Studies/Results: CT KNEE RIGHT W CONTRAST Result Date: 04/17/2024 CLINICAL DATA:  Soft tissue infection suspected. Postop right peroneal nerve decompression 03/30/2024 with increased pain and drainage from the wound 5 days after surgery. Response to antibiotics. EXAM: CT OF THE RIGHT KNEE WITH CONTRAST TECHNIQUE: Multidetector CT imaging was performed following the standard protocol during bolus administration of intravenous contrast. RADIATION DOSE REDUCTION: This exam was performed according to the departmental dose-optimization program which includes automated exposure control, adjustment of the mA and/or kV according to patient size and/or use of iterative reconstruction technique. CONTRAST:  75mL OMNIPAQUE  IOHEXOL  350 MG/ML SOLN COMPARISON:  Ultrasound 04/12/2024. FINDINGS: Bones/Joint/Cartilage No evidence of acute fracture, dislocation or osteomyelitis. There are moderate medial and mild patellofemoral and lateral compartment degenerative changes for age. There is nonspecific moderate-sized knee joint effusion with mild synovial enhancement. Ligaments Suboptimally assessed by CT. The cruciate ligaments are grossly intact. There is some  lateral meniscal chondrocalcinosis. Muscles and Tendons Intact extensor mechanism. There is mild spurring at the quadriceps insertion on the patella. No focal muscular abnormalities are identified surrounding the knee. Soft tissues Surgical wound noted lateral and proximal to the fibular head with ill-defined increased density within the underlying subcutaneous fat measuring up to 2.5 x 1.4 cm on axial image 77/4. There is inferior extension of subfascial fluid along the superficial aspect of the peroneal muscles, measuring up to 3.2 x 1.2  cm transverse and extending approximately 6.7 cm in length. This fluid is not associated with any suspicious peripheral enhancement. No organized fluid collection, unexpected foreign body or soft tissue emphysema. Prominent vascular calcifications are noted without evidence of large vessel occlusion. IMPRESSION: 1. Surgical wound lateral and proximal to the fibular head with ill-defined increased density within the underlying subcutaneous fat, likely postoperative edema. 2. Inferior extension of subfascial fluid along the superficial aspect of the peroneal muscles without suspicious peripheral enhancement, likely postoperative seroma. 3. No organized fluid collection, unexpected foreign body or soft tissue emphysema. 4. Nonspecific moderate-sized knee joint effusion with mild synovial enhancement, likely reactive. 5. No evidence of osteomyelitis. 6. Moderate medial and mild patellofemoral and lateral compartment degenerative changes for age. Electronically Signed   By: Elmon Hagedorn M.D.   On: 04/17/2024 11:14     Assessment/Plan: Post operative wound of right leg = appears worse, more induration but it more focal. Less induration into quadriceps. Will change abtx from ceftriaxone  to cefepime to have pseudomonal coverage. Continue with linezolid  CT showing fluid collection non enhancing thus favoring seroma. Asked dr pool to review if patient would benefit from  I x D.  Leukocytosis = improving  Right knee effusion = reactive, do not think it needs aspiration at this time  Sore throat = will give chlorasept  New transaminitis/increased alp = will check RUQ u/S and hepatitis testing  Hyperglycemia - T2DM = poorly controlled, would benefit for titration on insulin  scale. Consider hospitalist consultation  evaluation of this patient requires complex antimicrobial therapy evaluation and counseling and isolation needs for disease transmission risk assessment and mitigation.    [facility] Pager: [phone]  04/17/2024, 1:48 PM

## 2024-04-17 NOTE — Plan of Care (Signed)

## 2024-04-17 NOTE — Care Management Important Message (Signed)
 Important Message  Patient Details  Name: Tim Torres MRN: [id number] Date of Birth: Jan 11, 1951   Important Message Given:  Yes - Medicare IM     Wynonia Hedges 04/17/2024, 3:07 PM

## 2024-04-17 NOTE — Progress Notes (Signed)
 Providing Compassionate, Quality Care - Together   Subjective: Patient reports sore throat that is well-managed with chloraseptic spray.  Objective: Vital signs in last 24 hours: Temp:  [97.1 F (36.2 C)-98.2 F (36.8 C)] 97.1 F (36.2 C) (06/03 1235) Pulse Rate:  [74-93] 75 (06/03 1235) Resp:  [16-18] 18 (06/03 1235) BP: (132-162)/(70-81) 132/78 (06/03 1235) SpO2:  [96 %-99 %] 98 % (06/03 1235)  Intake/Output from previous day: 06/02 0701 - 06/03 0700 In: -  Out: 3250 [Urine:3250] Intake/Output this shift: Total I/O In: -  Out: 300 [Urine:300]  Alert and oriented PERRLA Speech clear, no exudate noted at oropharanyx MAE, guarding RLE due to pain Surgical wound covered with Kerlix and nonadherent dressing. Dressing is clean, dry, and intact  Lab Results: Recent Labs    04/17/24 1031  WBC 7.4  HGB 11.6*  HCT 35.0*  PLT 204   BMET Recent Labs    04/16/24 1631  NA 131*  K 4.9  CL 94*  CO2 26  GLUCOSE 386*  BUN 35*  CREATININE 1.54*  CALCIUM 9.2    Studies/Results: CT KNEE RIGHT W CONTRAST Result Date: 04/17/2024 CLINICAL DATA:  Soft tissue infection suspected. Postop right peroneal nerve decompression 03/30/2024 with increased pain and drainage from the wound 5 days after surgery. Response to antibiotics. EXAM: CT OF THE RIGHT KNEE WITH CONTRAST TECHNIQUE: Multidetector CT imaging was performed following the standard protocol during bolus administration of intravenous contrast. RADIATION DOSE REDUCTION: This exam was performed according to the departmental dose-optimization program which includes automated exposure control, adjustment of the mA and/or kV according to patient size and/or use of iterative reconstruction technique. CONTRAST:  75mL OMNIPAQUE  IOHEXOL  350 MG/ML SOLN COMPARISON:  Ultrasound 04/12/2024. FINDINGS: Bones/Joint/Cartilage No evidence of acute fracture, dislocation or osteomyelitis. There are moderate medial and mild patellofemoral and  lateral compartment degenerative changes for age. There is nonspecific moderate-sized knee joint effusion with mild synovial enhancement. Ligaments Suboptimally assessed by CT. The cruciate ligaments are grossly intact. There is some lateral meniscal chondrocalcinosis. Muscles and Tendons Intact extensor mechanism. There is mild spurring at the quadriceps insertion on the patella. No focal muscular abnormalities are identified surrounding the knee. Soft tissues Surgical wound noted lateral and proximal to the fibular head with ill-defined increased density within the underlying subcutaneous fat measuring up to 2.5 x 1.4 cm on axial image 77/4. There is inferior extension of subfascial fluid along the superficial aspect of the peroneal muscles, measuring up to 3.2 x 1.2 cm transverse and extending approximately 6.7 cm in length. This fluid is not associated with any suspicious peripheral enhancement. No organized fluid collection, unexpected foreign body or soft tissue emphysema. Prominent vascular calcifications are noted without evidence of large vessel occlusion. IMPRESSION: 1. Surgical wound lateral and proximal to the fibular head with ill-defined increased density within the underlying subcutaneous fat, likely postoperative edema. 2. Inferior extension of subfascial fluid along the superficial aspect of the peroneal muscles without suspicious peripheral enhancement, likely postoperative seroma. 3. No organized fluid collection, unexpected foreign body or soft tissue emphysema. 4. Nonspecific moderate-sized knee joint effusion with mild synovial enhancement, likely reactive. 5. No evidence of osteomyelitis. 6. Moderate medial and mild patellofemoral and lateral compartment degenerative changes for age. Electronically Signed   By: Elmon Hagedorn M.D.   On: 04/17/2024 11:14    Assessment/Plan: Patient is s/p peroneal nerve decompression with postoperative wound infection    LOS: 5 days   -Encourage  mobilization -Abx per ID  I am in  communication with my attending and they agree with the plan for this patient.   Henreitta Locus, DNP, AGNP-C Nurse Practitioner  [facility] [address], [place], [place] [address] P: [phone]    F: [phone]  04/17/2024, 2:14 PM

## 2024-04-18 ENCOUNTER — Inpatient Hospital Stay (HOSPITAL_COMMUNITY)

## 2024-04-18 DIAGNOSIS — A419 Sepsis, unspecified organism: Secondary | ICD-10-CM

## 2024-04-18 DIAGNOSIS — D72829 Elevated white blood cell count, unspecified: Secondary | ICD-10-CM | POA: Diagnosis not present

## 2024-04-18 DIAGNOSIS — R7401 Elevation of levels of liver transaminase levels: Secondary | ICD-10-CM | POA: Diagnosis not present

## 2024-04-18 DIAGNOSIS — N1831 Chronic kidney disease, stage 3a: Secondary | ICD-10-CM

## 2024-04-18 DIAGNOSIS — T8149XA Infection following a procedure, other surgical site, initial encounter: Secondary | ICD-10-CM | POA: Diagnosis not present

## 2024-04-18 DIAGNOSIS — J029 Acute pharyngitis, unspecified: Secondary | ICD-10-CM | POA: Diagnosis not present

## 2024-04-18 DIAGNOSIS — E119 Type 2 diabetes mellitus without complications: Secondary | ICD-10-CM

## 2024-04-18 DIAGNOSIS — L03115 Cellulitis of right lower limb: Secondary | ICD-10-CM | POA: Diagnosis not present

## 2024-04-18 LAB — COMPREHENSIVE METABOLIC PANEL WITH GFR
ALT: 128 U/L — ABNORMAL HIGH (ref 0–44)
AST: 75 U/L — ABNORMAL HIGH (ref 15–41)
Albumin: 2.3 g/dL — ABNORMAL LOW (ref 3.5–5.0)
Alkaline Phosphatase: 147 U/L — ABNORMAL HIGH (ref 38–126)
Anion gap: 9 (ref 5–15)
BUN: 33 mg/dL — ABNORMAL HIGH (ref 8–23)
CO2: 24 mmol/L (ref 22–32)
Calcium: 9.4 mg/dL (ref 8.9–10.3)
Chloride: 96 mmol/L — ABNORMAL LOW (ref 98–111)
Creatinine, Ser: 1.44 mg/dL — ABNORMAL HIGH (ref 0.61–1.24)
GFR, Estimated: 52 mL/min — ABNORMAL LOW (ref >60)
Glucose, Bld: 467 mg/dL — ABNORMAL HIGH (ref 70–99)
Potassium: 4.7 mmol/L (ref 3.5–5.1)
Sodium: 129 mmol/L — ABNORMAL LOW (ref 135–145)
Total Bilirubin: 0.5 mg/dL (ref 0.0–1.2)
Total Protein: 6.9 g/dL (ref 6.5–8.1)

## 2024-04-18 LAB — CBC WITH DIFFERENTIAL/PLATELET
Abs Immature Granulocytes: 0.1 10*3/uL — ABNORMAL HIGH (ref 0.00–0.07)
Basophils Absolute: 0 10*3/uL (ref 0.0–0.1)
Basophils Relative: 1 %
Eosinophils Absolute: 0.2 10*3/uL (ref 0.0–0.5)
Eosinophils Relative: 2 %
HCT: 33.2 % — ABNORMAL LOW (ref 39.0–52.0)
Hemoglobin: 11.2 g/dL — ABNORMAL LOW (ref 13.0–17.0)
Immature Granulocytes: 2 %
Lymphocytes Relative: 16 %
Lymphs Abs: 1 10*3/uL (ref 0.7–4.0)
MCH: 31 pg (ref 26.0–34.0)
MCHC: 33.7 g/dL (ref 30.0–36.0)
MCV: 92 fL (ref 80.0–100.0)
Monocytes Absolute: 0.4 10*3/uL (ref 0.1–1.0)
Monocytes Relative: 7 %
Neutro Abs: 4.8 10*3/uL (ref 1.7–7.7)
Neutrophils Relative %: 72 %
Platelets: 191 10*3/uL (ref 150–400)
RBC: 3.61 MIL/uL — ABNORMAL LOW (ref 4.22–5.81)
RDW: 13.5 % (ref 11.5–15.5)
WBC: 6.5 10*3/uL (ref 4.0–10.5)
nRBC: 0 % (ref 0.0–0.2)

## 2024-04-18 LAB — HEPATITIS B SURFACE ANTIBODY, QUANTITATIVE: Hep B S AB Quant (Post): <3.5 m[IU]/mL — ABNORMAL LOW

## 2024-04-18 LAB — GLUCOSE, CAPILLARY
Glucose-Capillary: 376 mg/dL — ABNORMAL HIGH (ref 70–99)
Glucose-Capillary: 464 mg/dL — ABNORMAL HIGH (ref 70–99)

## 2024-04-18 LAB — CK: Total CK: 29 U/L — ABNORMAL LOW (ref 49–397)

## 2024-04-18 LAB — MAGNESIUM: Magnesium: 2 mg/dL (ref 1.7–2.4)

## 2024-04-18 LAB — HEMOGLOBIN A1C
Hgb A1c MFr Bld: 8.1 % — ABNORMAL HIGH (ref 4.8–5.6)
Mean Plasma Glucose: 185.77 mg/dL

## 2024-04-18 MED ORDER — INSULIN ASPART 100 UNIT/ML IJ SOLN: 0.0000 [IU] | Freq: Three times a day (TID) | INTRAMUSCULAR | Status: DC

## 2024-04-18 MED ORDER — INSULIN ASPART 100 UNIT/ML IJ SOLN
0.0000 [IU] | INTRAMUSCULAR | Status: DC
  Administered 2024-04-18 – 2024-04-19 (×2): 20 [IU] via SUBCUTANEOUS
  Administered 2024-04-19: 4 [IU] via SUBCUTANEOUS

## 2024-04-18 MED ORDER — INSULIN ASPART 100 UNIT/ML IJ SOLN: 4.0000 [IU] | Freq: Three times a day (TID) | INTRAMUSCULAR | Status: DC

## 2024-04-18 MED ORDER — SODIUM CHLORIDE 0.9 % IV SOLN: INTRAVENOUS | Status: AC

## 2024-04-18 MED ORDER — INSULIN ASPART 100 UNIT/ML IJ SOLN: 0.0000 [IU] | Freq: Every day | INTRAMUSCULAR | Status: DC

## 2024-04-18 MED ORDER — INSULIN GLARGINE-YFGN 100 UNIT/ML ~~LOC~~ SOLN
20.0000 [IU] | Freq: Two times a day (BID) | SUBCUTANEOUS | Status: DC
  Administered 2024-04-18: 20 [IU] via SUBCUTANEOUS
  Filled 2024-04-18 (×3): qty 0.2

## 2024-04-18 MED ORDER — SODIUM CHLORIDE 0.9 % IV BOLUS
500.0000 mL | Freq: Once | INTRAVENOUS | Status: AC
  Administered 2024-04-18: 500 mL via INTRAVENOUS

## 2024-04-18 MED ORDER — TECHNETIUM TC 99M MEBROFENIN IV KIT
5.0000 | PACK | Freq: Once | INTRAVENOUS | Status: AC | PRN
  Administered 2024-04-18: 5 via INTRAVENOUS

## 2024-04-18 MED ORDER — HYDROCODONE-ACETAMINOPHEN 5-325 MG PO TABS
1.0000 | ORAL_TABLET | ORAL | Status: DC | PRN
  Administered 2024-04-19 – 2024-04-20 (×6): 1 via ORAL
  Filled 2024-04-18 (×7): qty 1

## 2024-04-18 MED ORDER — HEPARIN SODIUM (PORCINE) 5000 UNIT/ML IJ SOLN
5000.0000 [IU] | Freq: Three times a day (TID) | INTRAMUSCULAR | Status: DC
  Administered 2024-04-18 – 2024-04-22 (×12): 5000 [IU] via SUBCUTANEOUS
  Filled 2024-04-18 (×12): qty 1

## 2024-04-18 NOTE — Inpatient Diabetes Management (Signed)
 Inpatient Diabetes Program Recommendations  AACE/ADA: New Consensus Statement on Inpatient Glycemic Control (2015)  Target Ranges:  Prepandial:   less than 140 mg/dL      Peak postprandial:   less than 180 mg/dL (1-2 hours)      Critically ill patients:  140 - 180 mg/dL   Lab Results  Component Value Date   GLUCAP 150 (H) 07/20/2022   HGBA1C 7.1 (H) 01/23/2024    Review of Glycemic Control  Diabetes history: DM2 Outpatient Diabetes medications: metformin  1000 mg BID  Current orders for Inpatient glycemic control: metformin  500 mg QAM  Inpatient Diabetes Program Recommendations:    Consider adding Novolog  0-9 TID with meals and 0-5 HS.  Continue to follow.  Thank you. Joni Net, RD, LDN, CDCES Inpatient Diabetes Coordinator [phone]

## 2024-04-18 NOTE — Progress Notes (Signed)
 RN told pt about insulin  and getting semglee he stated " nope he will not be taking insulin  and getting hooked on it, he takes 1000mg  of metformin  in the morning and in the evening and he has not been getting it correctly but that is what he wants he will not take insulin  " Dr. Bonita Bussing paged and notified awaiting further instruction.

## 2024-04-18 NOTE — Evaluation (Signed)
 Physical Therapy Evaluation Patient Details Name: Tim Torres MRN: [id number] DOB: 1951-04-26 Today's Date: 04/18/2024  History of Present Illness  Pt is 73 yo presenting to [facility] 5/29 for s/p surgical infection. Pt had R peroneal nerve decompression on 5/16. Did well for 4 days prior to noticing drainage from incision site. PMH: Arthritis, DM, HTN, MI, sleep apnea  Clinical Impression  Pt is presenting below baseline level of functioning at this time. Currently pt is Min A for sit to stand with Crutches and gait CGA with RW.  Pt has family that is able to assist 24/7 and pt reports his family can assist him navigating stairs per home set up. Due to pt current functional status, home set up and available assistance at home recommending skilled physical therapy services 3x/week in order to address strength, balance and functional mobility to decrease risk for falls, injury and re-hospitalization.         If plan is discharge home, recommend the following: A little help with walking and/or transfers;Assistance with cooking/housework;Assist for transportation;Help with stairs or ramp for entrance     Equipment Recommendations None recommended by PT     Functional Status Assessment Patient has had a recent decline in their functional status and demonstrates the ability to make significant improvements in function in a reasonable and predictable amount of time.     Precautions / Restrictions Precautions Precautions: Fall Restrictions Weight Bearing Restrictions Per Provider Order: No      Mobility  Bed Mobility Overal bed mobility: Modified Independent                  Transfers Overall transfer level: Needs assistance Equipment used: Rolling walker (2 wheels), Crutches Transfers: Sit to/from Stand, Bed to chair/wheelchair/BSC Sit to Stand: Min assist, Contact guard assist   Step pivot transfers: Contact guard assist       General transfer comment: Min A sit to stand  with crutches and CGA with RW. Pt would rather use crutches but educated on improvement in mobility with RW. Transfer from EOB to recliner with RW. pt was CGA for trnasfer from EOB to recliner. Pt performed sit to stand 2x during session 1x with crutches at Min A and 1x with RW at [facility].    Ambulation/Gait Ambulation/Gait assistance: Min assist Gait Distance (Feet): 20 Feet Assistive device: Rolling walker (2 wheels), Crutches Gait Pattern/deviations: Step-to pattern, Decreased stance time - right, Decreased step length - left, Decreased step length - right, Antalgic Gait velocity: decreased Gait velocity interpretation: <1.31 ft/sec, indicative of household ambulator   General Gait Details: Pt has forefoot initial contact with the RLE; attempted to get pt to have more flat foot stance on RLE to decrease pressure on the knee but pt was hesitant. Pt Min A very unsteady with poor balance with crutches. Significant improvement in gait pattern and WB on the R with RW.  Stairs Stairs:  (unable to progress due to pain and MD arrival)             Balance Overall balance assessment: Needs assistance Sitting-balance support: Single extremity supported, Feet supported Sitting balance-Leahy Scale: Good     Standing balance support: Bilateral upper extremity supported, During functional activity, Reliant on assistive device for balance Standing balance-Leahy Scale: Poor Standing balance comment: Pt has poor balance making it difficult to use cruthces even with forefoot touch down weight bearing on the R         Pertinent Vitals/Pain Pain Assessment Pain Assessment: 0-10 Pain  Score: 6  Pain Descriptors / Indicators: Aching, Burning, Sharp Pain Intervention(s): Limited activity within patient's tolerance, Monitored during session    Home Living Family/patient expects to be discharged to:: Private residence Living Arrangements: Spouse/significant other Available Help at Discharge:  Family;Available 24 hours/day   Home Access: Stairs to enter Entrance Stairs-Rails: Left;Right;Can reach both Entrance Stairs-Number of Steps: 5   Home Layout: One level Home Equipment: Agricultural consultant (2 wheels);Rollator (4 wheels);Cane - single point;Crutches      Prior Function Prior Level of Function : Independent/Modified Independent;Working/employed;Driving             Mobility Comments: Pt was amb without a AD. Working re-modeling homes. ADLs Comments: ind with ADL" s and IADl's.     Extremity/Trunk Assessment   Upper Extremity Assessment Upper Extremity Assessment: Overall WFL for tasks assessed    Lower Extremity Assessment Lower Extremity Assessment: LLE deficits/detail LLE Deficits / Details: Pt does not fully WB through LLE due to pain LLE: Unable to fully assess due to pain    Cervical / Trunk Assessment Cervical / Trunk Assessment: Normal  Communication   Communication Communication: No apparent difficulties    Cognition Arousal: Alert Behavior During Therapy: WFL for tasks assessed/performed   PT - Cognitive impairments: No apparent impairments, Safety/Judgement     PT - Cognition Comments: pt has poor safety awareness Following commands: Intact       Cueing Cueing Techniques: Verbal cues     General Comments General comments (skin integrity, edema, etc.): Pt has some drainage noted at the R lateral lower leg through gauze bandage.        Assessment/Plan    PT Assessment Patient needs continued PT services  PT Problem List Decreased activity tolerance;Decreased balance;Decreased mobility;Pain       PT Treatment Interventions DME instruction;Balance training;Gait training;Stair training;Functional mobility training;Therapeutic activities;Therapeutic exercise    PT Goals (Current goals can be found in the Care Plan section)  Acute Rehab PT Goals Patient Stated Goal: to return home and decrease pain PT Goal Formulation: With  patient Time For Goal Achievement: 05/02/24 Potential to Achieve Goals: Good    Frequency Min 2X/week        AM-PAC PT "6 Clicks" Mobility  Outcome Measure Help needed turning from your back to your side while in a flat bed without using bedrails?: None Help needed moving from lying on your back to sitting on the side of a flat bed without using bedrails?: None Help needed moving to and from a bed to a chair (including a wheelchair)?: A Little Help needed standing up from a chair using your arms (e.g., wheelchair or bedside chair)?: A Little Help needed to walk in hospital room?: A Little Help needed climbing 3-5 steps with a railing? : A Lot 6 Click Score: 19    End of Session Equipment Utilized During Treatment: Gait belt Activity Tolerance: Patient limited by pain Patient left: in chair;with call bell/phone within reach Nurse Communication: Mobility status PT Visit Diagnosis: Unsteadiness on feet (R26.81);Other abnormalities of gait and mobility (R26.89);Pain Pain - Right/Left: Right Pain - part of body: Leg    Time: 0920-0945 PT Time Calculation (min) (ACUTE ONLY): 25 min   Charges:   PT Evaluation $PT Eval Low Complexity: 1 Low PT Treatments $Therapeutic Activity: 8-22 mins PT General Charges $$ ACUTE PT VISIT: 1 Visit        Sloan Duncans, DPT, CLT  Acute Rehabilitation Services Office: [phone] (Secure chat preferred)   [name] 04/18/2024,  9:57 AM

## 2024-04-18 NOTE — Consult Note (Signed)
 Initial Consultation Note   Tim Torres WUJ:[id number] DOB: 1951-09-22 DOA: 04/12/2024  PCP: Colene Dauphin, MD   I have personally briefly reviewed patient's old medical records in [facility]   Chief Complaint: Duabetes MEllitus  HPI: Tim Torres is a 73 y.o. male male with past medical history of essential hypertension, diabetes mellitus type 2 coronary artery disease and gout who had right peroneal nerve decompression 03/30/2024, went for follow-up appointment with his neurosurgeon 04/12/2024 was found to have a wound incision drainage with a temperature of 103.2 and a white blood cell count of 11K, so he was admitted to the hospital ultrasound showed no evidence of drainable fluid collection.  He was started on antibiotics for concern of cellulitis with deep tissue infection, infectious disease was consulted for antibiotic management.  1 out of 4 blood cultures grew Staphylococcus capitis.  He is currently now on IV cefepime and flunisolide.  We are consulted for management of his diabetes mellitus and hypertension. He denies any abdominal pain nausea vomiting or fevers.  Is been tolerating his diet last bowel movement was yesterday.   Cultures: 04/12/2024 1 out of 4 blood cultures grew Staph capitis.   Antibiotics: 04/12/2024 IV Rocephin  till 04/16/2024 04/16/2024 IV linezolid 04/17/2024 IV cefepime   Review of Systems: All systems reviewed and apart from history of presenting illness, are negative.  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 His smoking use included cigarettes. He has never used smokeless tobacco. He reports current alcohol  use of about 1.0 standard drink of alcohol  per week. He reports that he does not use drugs.   Allergies  Allergen Reactions   Shellfish Allergy Shortness Of Breath and Swelling   Atorvastatin Other (See Comments)    Myalgia    Rosuvastatin Other (See Comments)    myalgia    History reviewed. No pertinent family history.  Mother had a heart attack  Prior to Admission medications   Medication Sig Start Date End Date Taking? Authorizing Provider  allopurinol  (ZYLOPRIM ) 300 MG tablet Take 1 tablet (300 mg total) by mouth daily. 01/23/24  Yes Burns, Beckey Bourgeois, MD  gabapentin  (NEURONTIN ) 300 MG capsule Take 300 mg by mouth 3 (three) times daily.   Yes [provider]  HYDROcodone -acetaminophen  (NORCO/VICODIN) 5-325 MG tablet Take 1 tablet by mouth every 4 (four) hours as needed.   Yes [provider]  lovastatin (MEVACOR) 40 MG tablet TAKE TWO TABLETS NIGHTLY 12/01/23  Yes Wilkinson, Dana E, FNP  MAGNESIUM  PO Take by mouth.   Yes [provider]  meclizine  (ANTIVERT ) 25 MG tablet Take 1 tablet (25 mg total) by mouth 3 (three) times daily as needed for dizziness or nausea. 09/24/19  Yes Shah, Pratik D, DO  metFORMIN  (GLUCOPHAGE -XR) 500  MG 24 hr tablet TAKE ONE TABLET EVERY MORNING AND TWO AT BEDTIME Patient taking differently: Take 500 mg by mouth See admin instructions. 2 tab qam and 2 tab qpm 02/09/24  Yes Burns, Beckey Bourgeois, MD  metoprolol  tartrate (LOPRESSOR ) 50 MG tablet TAKE ONE TABLET TWICE DAILY 02/09/24  Yes Burns, Beckey Bourgeois, MD  zolpidem  (AMBIEN ) 5 MG tablet TAKE ONE TABLET AT BEDTIME AS NEEDED FOR SLEEP 02/09/24  Yes Burns, Beckey Bourgeois, MD  aspirin  325 MG tablet Take 325 mg by  mouth daily. Patient not taking: Reported on 04/12/2024    [provider]   Physical Exam: Vitals:   04/17/24 2349 04/18/24 0400 04/18/24 0741 04/18/24 1119  BP: (!) 162/97 124/87 (!) 149/69 130/76  Pulse: 66 74 76 71  Resp: 18 18  18   Temp: 99.9 F (37.7 C) 98.9 F (37.2 C) 97.8 F (36.6 C) 98.3 F (36.8 C)  TempSrc:  Oral Oral Oral  SpO2: 99% 99% 98% 96%  Weight:      Height:        General exam: Moderately built and nourished patient, lying comfortably. Head, eyes and ENT: Nontraumatic and normocephalic. . Neck: Supple. No JVD, carotid bruit or thyromegaly. Lymphatics: No lymphadenopathy. Respiratory system: Clear to auscultation. No increased work of breathing. Cardiovascular system: S1 and S2 heard, RRR. No JVD. Gastrointestinal system: Abdomen is nondistended, soft and nontender. Normal bowel sounds heard. . Central nervous system: Alert and oriented. No focal neurological deficits. Extremities: Leg is still swollen Skin: Surgical site with erythema and some warmth to touch  Musculoskeletal system: Negative exam. Psychiatry: Pleasant and cooperative.   Labs on Admission:  Basic Metabolic Panel: Recent Labs  Lab 04/12/24 1810 04/16/24 1631 04/18/24 0923  NA 134* 131* 129*  K 4.5 4.9 4.7  CL 100 94* 96*  CO2 21* 26 24  GLUCOSE 222* 386* 467*  BUN 29* 35* 33*  CREATININE 1.69* 1.54* 1.44*  CALCIUM 9.6 9.2 9.4   Liver Function Tests: Recent Labs  Lab 04/16/24 1631 04/18/24 0923  AST 188* 75*  ALT 167* 128*  ALKPHOS 164* 147*  BILITOT 0.6 0.5  PROT 6.9 6.9  ALBUMIN 2.7* 2.3*   No results for input(s): "LIPASE", "AMYLASE" in the last 168 hours. No results for input(s): "AMMONIA" in the last 168 hours. CBC: Recent Labs  Lab 04/12/24 1810 04/17/24 1031 04/18/24 0923  WBC 11.0* 7.4 6.5  NEUTROABS 8.7* 6.0 4.8  HGB 13.0 11.6* 11.2*  HCT 39.0 35.0* 33.2*  MCV 92.9 91.6 92.0  PLT 185 204 191   Cardiac Enzymes: No results for input(s):  "CKTOTAL", "CKMB", "CKMBINDEX", "TROPONINI" in the last 168 hours.  BNP (last 3 results) No results for input(s): "PROBNP" in the last 8760 hours. CBG: No results for input(s): "GLUCAP" in the last 168 hours.  Radiological Exams on Admission: US  Abdomen Limited RUQ (LIVER/GB) Result Date: 04/18/2024 CLINICAL DATA:  Transaminitis. EXAM: ULTRASOUND ABDOMEN LIMITED RIGHT UPPER QUADRANT COMPARISON:  None Available. FINDINGS: Gallbladder: A 2.3 cm shadowing echogenic gallstone is seen within the neck of the gallbladder. The gallbladder wall measures 4.3 mm in thickness. The presence or absence of a sonographic Abigail Abler sign was not documented by the sonographer. Common bile duct: Diameter: 2.1 mm Liver: No focal lesion identified. Within normal limits in parenchymal echogenicity. Portal vein is patent on color Doppler imaging with normal direction of blood flow towards the liver. Other: None. IMPRESSION: 1. Cholelithiasis and mild gallbladder wall thickening without additional findings to suggest the presence of  acute cholecystitis. Further evaluation with a nuclear medicine hepatobiliary scan is recommended if this is of clinical concern. Electronically Signed   By: Virgle Grime M.D.   On: 04/18/2024 02:09   CT KNEE RIGHT W CONTRAST Result Date: 04/17/2024 CLINICAL DATA:  Soft tissue infection suspected. Postop right peroneal nerve decompression 03/30/2024 with increased pain and drainage from the wound 5 days after surgery. Response to antibiotics. EXAM: CT OF THE RIGHT KNEE WITH CONTRAST TECHNIQUE: Multidetector CT imaging was performed following the standard protocol during bolus administration of intravenous contrast. RADIATION DOSE REDUCTION: This exam was performed according to the departmental dose-optimization program which includes automated exposure control, adjustment of the mA and/or kV according to patient size and/or use of iterative reconstruction technique. CONTRAST:  75mL OMNIPAQUE  IOHEXOL   350 MG/ML SOLN COMPARISON:  Ultrasound 04/12/2024. FINDINGS: Bones/Joint/Cartilage No evidence of acute fracture, dislocation or osteomyelitis. There are moderate medial and mild patellofemoral and lateral compartment degenerative changes for age. There is nonspecific moderate-sized knee joint effusion with mild synovial enhancement. Ligaments Suboptimally assessed by CT. The cruciate ligaments are grossly intact. There is some lateral meniscal chondrocalcinosis. Muscles and Tendons Intact extensor mechanism. There is mild spurring at the quadriceps insertion on the patella. No focal muscular abnormalities are identified surrounding the knee. Soft tissues Surgical wound noted lateral and proximal to the fibular head with ill-defined increased density within the underlying subcutaneous fat measuring up to 2.5 x 1.4 cm on axial image 77/4. There is inferior extension of subfascial fluid along the superficial aspect of the peroneal muscles, measuring up to 3.2 x 1.2 cm transverse and extending approximately 6.7 cm in length. This fluid is not associated with any suspicious peripheral enhancement. No organized fluid collection, unexpected foreign body or soft tissue emphysema. Prominent vascular calcifications are noted without evidence of large vessel occlusion. IMPRESSION: 1. Surgical wound lateral and proximal to the fibular head with ill-defined increased density within the underlying subcutaneous fat, likely postoperative edema. 2. Inferior extension of subfascial fluid along the superficial aspect of the peroneal muscles without suspicious peripheral enhancement, likely postoperative seroma. 3. No organized fluid collection, unexpected foreign body or soft tissue emphysema. 4. Nonspecific moderate-sized knee joint effusion with mild synovial enhancement, likely reactive. 5. No evidence of osteomyelitis. 6. Moderate medial and mild patellofemoral and lateral compartment degenerative changes for age. Electronically  Signed   By: Elmon Hagedorn M.D.   On: 04/17/2024 11:14    EKG: Independently reviewed. none  Assessment/Plan Sepsis secondary to cellulitis of right lower extremity/ Postoperative wound infection He is hemodynamically stable blood pressure and heart rate are stable, satting greater than 96% on room air. He was started initially on empiric antibiotics now on flunisolide and cefepime. ID is on board and managing antibiotics. 1 out of 2 blood cultures grew Staph capitis. Has remained afebrile leukocytosis resolved. Ultrasound of the leg showed no abscess. CT of the knee showed postoperative edema no fluid collection. Further management per ID and neurosurgery, internal medicine will continue to follow along with you.  Right upper quadrant abdominal pain/elevated LFTs: Patient is currently afebrile no leukocytosis on IV linezolid and cefepime. Abdominal ultrasound showed cholelithiasis with mild gallbladder wall thickening with no additional findings suggestive of acute cholecystitis. HIDA scan is pending. LFTs along with alkaline phosphatase, bilirubin is unremarkable which is reassuring.  Are trending down check a CK. He denies any abdominal pain, abdominal exam is fine, abdominal ultrasound is unremarkable. Unlikely to have gallbladder problems this probably due to infectious etiology.  Hyperglycemic hyperosmolar nonketotic state /DM2 (diabetes mellitus, type 2) (HCC) Last A1c on 01/23/2024 it was 7.1. Blood glucose currently 400. Give bolus normal saline and start him on normal saline for 24 hours. Strict I's and O's and daily weights Will start on the long-acting insulin  plus sliding scale. Discontinue metformin  due to his renal dysfunction and the fact that he is in the hospital.  Acute kidney injury on CKD (chronic kidney disease) stage 3, GFR 30-59 ml/min (HCC): Likely hemodynamically mediated. Slightly elevated, his baseline creatinine is around 1.3. On admission it was 6.1  he was started on IV fluids his creatinine this morning is 1.4 is currently trending lower. Hold ACE inhibitor or ARB avoid NSAIDs.  Hyponatremia: Likely a combination of hypovolemic and pseudohyponatremia. Sodium on IV fluids recheck basic metabolic panel in the morning.  Normocytic anemia: Hemoglobin appears to be stable continue to monitor.  Essential HTN (hypertension) Blood pressure stable continue metoprolol .    DVT Prophylaxis: Lovenox  Code Status: full  Family Communication: daughter  Disposition Plan: inpatient      Macdonald Savoy MD [facility]   04/18/2024, 4:37 PM

## 2024-04-18 NOTE — TOC Transition Note (Signed)
 Transition of Care [facility]) - Discharge Note   Patient Details  Name: Tim Torres MRN: [id number] Date of Birth: 12-15-1950  Transition of Care [facility]) CM/SW Contact:  Jonathan Neighbor, RN Phone Number: 04/18/2024, 1:06 PM   Clinical Narrative:     Pt with home health needs when ready for d/c. HH PT/RN. Pt didn't have a preference for [facility] agency. CM has arranged HH with [facility]. Information on the AVS. Gasper Karst will contact him for the first home visit.  TOC following for further d/c needs.     Barriers to Discharge: Continued Medical Work up   Patient Goals and CMS Choice   CMS Medicare.gov Compare Post Acute Care list provided to:: Patient Choice offered to / list presented to : Patient      Discharge Placement                       Discharge Plan and Services Additional resources added to the After Visit Summary for     Discharge Planning Services: CM Consult Post Acute Care Choice: Home Health                    HH Arranged: PT, RN [facility] Agency: [facility] Date [facility] Agency Contacted: 04/18/24   Representative spoke with at [facility] Agency: Randel Buss  Social Drivers of Health (SDOH) Interventions SDOH Screenings   Food Insecurity: No Food Insecurity (04/12/2024)  Housing: Low Risk  (04/12/2024)  Transportation Needs: No Transportation Needs (04/13/2024)  Utilities: Not At Risk (04/13/2024)  Alcohol  Screen: Low Risk  (04/16/2024)  Depression (PHQ2-9): Low Risk  (01/23/2024)  Financial Resource Strain: Low Risk  (04/16/2024)  Physical Activity: Inactive (04/16/2024)  Social Connections: Socially Integrated (04/13/2024)  Stress: No Stress Concern Present (04/16/2024)  Tobacco Use: Medium Risk (04/16/2024)  Health Literacy: Adequate Health Literacy (04/16/2024)     Readmission Risk Interventions     No data to display

## 2024-04-18 NOTE — Consult Note (Incomplete)
 Initial Consultation Note   Patient: Tim Torres UJW:[id number] DOB: 04/17/1951 PCP: Colene Dauphin, MD DOA: 04/12/2024 DOS: the patient was seen and examined on 04/18/2024 Primary service: Agustina Aldrich, MD  Referring physician: Dr. Adonis Alamin Reason for consult: diabetes Manegement  Assessment/Plan: Assessment and Plan: No notes have been filed under this hospital service. Service: Hospitalist      [facility] will continue to follow the patient.  HPI: Tim Torres is a 73 y.o. male with past medical history of essential hypertension, diabetes mellitus type 2 coronary artery disease and gout who had right peroneal nerve decompression 03/30/2024, went for follow-up appointment with his neurosurgeon 04/12/2024 was found to have a wound incision drainage with a temperature of 103.2 and a white blood cell count of 11K, so he was admitted to the hospital ultrasound showed no evidence of drainable fluid collection.  He was started on antibiotics for concern of cellulitis with deep tissue infection, infectious disease was consulted for antibiotic management.  1 out of 4 blood cultures grew Staphylococcus capitis.  He is currently now on IV cefepime and flunisolide.  We are consulted for management of his diabetes mellitus and hypertension  Cultures: 04/12/2024 1 out of 4 blood cultures grew Staph capitis.  Antibiotics: 04/12/2024 IV Rocephin  till 04/16/2024 04/16/2024 IV linezolid 04/17/2024 IV cefepime  Review of Systems: As mentioned in the history of present illness. All other systems reviewed and are negative. 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 His smoking use included cigarettes. He has never used smokeless tobacco. He reports current alcohol  use of about 1.0 standard drink of alcohol  per week. He reports that he does not use drugs.  Allergies  Allergen Reactions   Shellfish Allergy Shortness Of Breath and Swelling   Atorvastatin Other (See Comments)    Myalgia    Rosuvastatin Other (See Comments)    myalgia    History reviewed. No pertinent family history.  Prior to Admission medications   Medication Sig Start Date End Date Taking? Authorizing Provider  allopurinol  (ZYLOPRIM ) 300 MG tablet Take 1 tablet (300 mg total) by mouth daily. 01/23/24  Yes Burns, Beckey Bourgeois, MD  gabapentin  (NEURONTIN ) 300 MG capsule Take 300 mg by mouth 3 (three) times daily.   Yes [provider]  HYDROcodone -acetaminophen  (NORCO/VICODIN) 5-325 MG tablet Take 1 tablet by mouth every 4 (four) hours as needed.   Yes [provider]  lovastatin (MEVACOR) 40 MG tablet TAKE TWO TABLETS NIGHTLY 12/01/23  Yes Wilkinson, Dana E, FNP  MAGNESIUM  PO Take by mouth.   Yes [provider]  meclizine  (ANTIVERT ) 25 MG tablet Take 1 tablet (25 mg total) by mouth 3 (three) times daily as needed for dizziness or nausea. 09/24/19  Yes Shah, Pratik D, DO  metFORMIN  (GLUCOPHAGE -XR) 500  MG 24 hr tablet TAKE ONE TABLET EVERY MORNING AND TWO AT BEDTIME Patient taking differently: Take 500 mg by mouth See admin instructions. 2 tab qam and 2 tab qpm 02/09/24  Yes Burns, Beckey Bourgeois, MD  metoprolol  tartrate (LOPRESSOR ) 50 MG tablet TAKE ONE TABLET TWICE DAILY 02/09/24  Yes Burns, Beckey Bourgeois, MD  zolpidem  (AMBIEN ) 5 MG tablet TAKE ONE TABLET AT BEDTIME AS NEEDED FOR SLEEP 02/09/24  Yes  Burns, Beckey Bourgeois, MD  aspirin  325 MG tablet Take 325 mg by mouth daily. Patient not taking: Reported on 04/12/2024    [provider]    Physical Exam: Vitals:   04/17/24 2349 04/18/24 0400 04/18/24 0741 04/18/24 1119  BP: (!) 162/97 124/87 (!) 149/69 130/76  Pulse: 66 74 76 71  Resp: 18 18  18   Temp: 99.9 F (37.7 C) 98.9 F (37.2 C) 97.8 F (36.6 C) 98.3 F (36.8 C)  TempSrc:  Oral Oral Oral  SpO2: 99% 99% 98% 96%  Weight:      Height:       General exam: In no acute distress. Respiratory system: Good air movement and clear to auscultation. Cardiovascular system: S1 & S2 heard, RRR. No JVD. Gastrointestinal system: Abdomen is nondistended, soft and nontender.  Extremities: No pedal edema. Skin: Surgical site with erythema and some warmth to touch Psychiatry: Judgement and insight appear normal. Mood & affect appropriate. Data Reviewed:    Family Communication: daughter Primary team communication: Dr. Adonis Alamin Thank you very much for involving us  in the care of your patient.  Author: Macdonald Savoy, MD 04/18/2024 4:37 PM  For on call review [email or web address].

## 2024-04-18 NOTE — Progress Notes (Signed)
 Pt CBG level 464. Hospitalist on call paged and new orders given. Clarified with Louis Row, MD to give 20U Novolog  now and repeat blood glucose level in one hour.

## 2024-04-18 NOTE — Consult Note (Addendum)
 WOC Nurse Consult Note:  Consult completed remotely utilizing images in medical record and record review.  Patient with a surgical wound, physician notes reflect "His right leg wound shows some darkening purpleish change. There is no significant warmth. There is no evidence of any deep fluctuance. The wound has separated somewhat more with some purulence and granulation on the surface."    Reason for Consult:Post Op right leg wound with induration, see picture in ID note from 6/03. MD recommended Silvercel, but not supplied on unit and unable to order singular dressings. Please advise.  Wound type: surgical Pressure Injury POA: NA Measurement: per nursing flowsheets Wound bed: covered with yellow slough Drainage (amount, consistency, odor) per nursing flow sheets Periwound: erythema and induration Dressing procedure/placement/frequency:  Cleanse wound Vashe (lawson #[id number]), allow to air dry,  place Aquacel AG (lawson #[id number]) and cover with foam dressing. Change daily.    WOC team will not follow patient at this time, please re consult if new needs arise.  Gillermo Lack, RN, MSN, [facility] WOC Team

## 2024-04-18 NOTE — Progress Notes (Addendum)
 [facility]    Date of Admission:  04/12/2024   Total days of antibiotics   ID: Tim Torres is a 73 y.o. male with   Principal Problem:   Postoperative wound infection    Subjective: Feeling poorly, still leg pain and swelling to right leg wound. Noticing to have some right upper quadrant pain.  Underwent U/S that showed cholelithiasis and GB wall thickening Medications:   allopurinol   300 mg Oral Daily   cholecalciferol   1,000 Units Oral Daily   cyanocobalamin   500 mcg Oral Daily   docusate sodium   100 mg Oral BID   gabapentin   300 mg Oral TID   linezolid  600 mg Oral Q12H   magnesium  oxide  400 mg Oral QHS   metFORMIN   500 mg Oral Q breakfast   metoprolol  tartrate  50 mg Oral BID   pantoprazole   40 mg Oral Daily   pravastatin   80 mg Oral q1800    Objective: Vital signs in last 24 hours: Temp:  [97.1 F (36.2 C)-99.9 F (37.7 C)] 97.8 F (36.6 C) (06/04 0741) Pulse Rate:  [66-81] 76 (06/04 0741) Resp:  [18-19] 18 (06/04 0400) BP: (124-162)/(69-97) 149/69 (06/04 0741) SpO2:  [96 %-99 %] 98 % (06/04 0741) Physical Exam  Constitutional: He is oriented to person, place, and time. He appears well-developed and well-nourished. No distress.  HENT:  Mouth/Throat: Oropharynx is clear and moist. No oropharyngeal exudate.  Cardiovascular: Normal rate, regular rhythm and normal heart sounds. Exam reveals no gallop and no friction rub.  No murmur heard.  Pulmonary/Chest: Effort normal and breath sounds normal. No respiratory distress. He has no wheezes.  Abdominal: Soft. Bowel sounds are normal. He exhibits no distension. There is no tenderness.  Ext: wound has exudate in the wound bed still swollen and purplish discoloration Neurological: He is alert and oriented to person, place, and time.  Skin: Skin is warm and dry. No rash noted. No erythema.  Psychiatric: He has a normal mood and affect. His behavior is normal.   Lab Results Recent  Labs    04/16/24 1631 04/17/24 1031  WBC  --  7.4  HGB  --  11.6*  HCT  --  35.0*  NA 131*  --   K 4.9  --   CL 94*  --   CO2 26  --   BUN 35*  --   CREATININE 1.54*  --    Liver Panel Recent Labs    04/16/24 1631  PROT 6.9  ALBUMIN 2.7*  AST 188*  ALT 167*  ALKPHOS 164*  BILITOT 0.6    Microbiology: reviewed Studies/Results: US  Abdomen Limited RUQ (LIVER/GB) Result Date: 04/18/2024 CLINICAL DATA:  Transaminitis. EXAM: ULTRASOUND ABDOMEN LIMITED RIGHT UPPER QUADRANT COMPARISON:  None Available. FINDINGS: Gallbladder: A 2.3 cm shadowing echogenic gallstone is seen within the neck of the gallbladder. The gallbladder wall measures 4.3 mm in thickness. The presence or absence of a sonographic Abigail Abler sign was not documented by the sonographer. Common bile duct: Diameter: 2.1 mm Liver: No focal lesion identified. Within normal limits in parenchymal echogenicity. Portal vein is patent on color Doppler imaging with normal direction of blood flow towards the liver. Other: None. IMPRESSION: 1. Cholelithiasis and mild gallbladder wall thickening without additional findings to suggest the presence of acute cholecystitis. Further evaluation with a nuclear medicine hepatobiliary scan is recommended if this is of clinical concern. Electronically Signed   By: Virgle Grime M.D.   On:  04/18/2024 02:09   CT KNEE RIGHT W CONTRAST Result Date: 04/17/2024 CLINICAL DATA:  Soft tissue infection suspected. Postop right peroneal nerve decompression 03/30/2024 with increased pain and drainage from the wound 5 days after surgery. Response to antibiotics. EXAM: CT OF THE RIGHT KNEE WITH CONTRAST TECHNIQUE: Multidetector CT imaging was performed following the standard protocol during bolus administration of intravenous contrast. RADIATION DOSE REDUCTION: This exam was performed according to the departmental dose-optimization program which includes automated exposure control, adjustment of the mA and/or kV  according to patient size and/or use of iterative reconstruction technique. CONTRAST:  75mL OMNIPAQUE  IOHEXOL  350 MG/ML SOLN COMPARISON:  Ultrasound 04/12/2024. FINDINGS: Bones/Joint/Cartilage No evidence of acute fracture, dislocation or osteomyelitis. There are moderate medial and mild patellofemoral and lateral compartment degenerative changes for age. There is nonspecific moderate-sized knee joint effusion with mild synovial enhancement. Ligaments Suboptimally assessed by CT. The cruciate ligaments are grossly intact. There is some lateral meniscal chondrocalcinosis. Muscles and Tendons Intact extensor mechanism. There is mild spurring at the quadriceps insertion on the patella. No focal muscular abnormalities are identified surrounding the knee. Soft tissues Surgical wound noted lateral and proximal to the fibular head with ill-defined increased density within the underlying subcutaneous fat measuring up to 2.5 x 1.4 cm on axial image 77/4. There is inferior extension of subfascial fluid along the superficial aspect of the peroneal muscles, measuring up to 3.2 x 1.2 cm transverse and extending approximately 6.7 cm in length. This fluid is not associated with any suspicious peripheral enhancement. No organized fluid collection, unexpected foreign body or soft tissue emphysema. Prominent vascular calcifications are noted without evidence of large vessel occlusion. IMPRESSION: 1. Surgical wound lateral and proximal to the fibular head with ill-defined increased density within the underlying subcutaneous fat, likely postoperative edema. 2. Inferior extension of subfascial fluid along the superficial aspect of the peroneal muscles without suspicious peripheral enhancement, likely postoperative seroma. 3. No organized fluid collection, unexpected foreign body or soft tissue emphysema. 4. Nonspecific moderate-sized knee joint effusion with mild synovial enhancement, likely reactive. 5. No evidence of osteomyelitis. 6.  Moderate medial and mild patellofemoral and lateral compartment degenerative changes for age. Electronically Signed   By: Elmon Hagedorn M.D.   On: 04/17/2024 11:14     Assessment/Plan: Cellulitis- deep tissue infection of right leg = continue with cefepime and linezolid. CT did not show drainable fluid collection, yet  Transamanitis = will check cmp. U/s suggests GB wall thickening, will get hida scan to evaluate. Will check bmp  T2DM = worsening hyperglycemia,in the setting of hyponatremia. Will recommend better control of BS. Consider hospitalist to help with DM management.   I have personally spent 50 minutes involved in face-to-face and non-face-to-face activities for this patient on the day of the visit. Professional time spent includes the following activities: Preparing to see the patient (review of tests),Performing a medically appropriate examination and/or evaluation , Ordering medications/tests/procedures, referring and communicating with other health care professionals, Documenting clinical information in the EMR, Independently interpreting results (not separately reported), Communicating results to the patient and dr. Adonis Alamin.      [facility] Pager: [phone]  04/18/2024, 9:06 AM

## 2024-04-18 NOTE — Progress Notes (Signed)
 Patient still with significant right leg pain.  Worsened with moving his right foot.  He also complains of poor appetite and some right upper quadrant abdominal pain.  He is afebrile.  His vital signs are stable.  His white count is trending down and is now 6.5.  His abdomen is mildly tender without rebound.  His right leg wound shows some darkening purpleish change.  There is no significant warmth.  There is no evidence of any deep fluctuance.  The wound has separated somewhat more with some purulence and granulation on the surface.  The patient's LFTs and alk phos are elevated.  Ultrasound of his abdomen was notable for a few small gallstones but no obvious obstructive processes or definite severe cholecystitis.  Plan for HIDA scan to better evaluate.  The patient's antibiotics have been changed because of his worsening cholestasis which may be a side effect of the ceftriaxone .  Currently significantly defervesced.  His white count is improved.  He still has some cellulitis certainly but I do not feel there is any evidence of any deep wound space collection that formal washout will make better.  Plan to continue observing his wound and continue his antibiotics now they have been switched for better staph coverage.

## 2024-04-19 ENCOUNTER — Other Ambulatory Visit (HOSPITAL_COMMUNITY): Payer: Self-pay

## 2024-04-19 ENCOUNTER — Telehealth (HOSPITAL_COMMUNITY): Payer: Self-pay | Admitting: Pharmacy Technician

## 2024-04-19 DIAGNOSIS — N1831 Chronic kidney disease, stage 3a: Secondary | ICD-10-CM | POA: Diagnosis not present

## 2024-04-19 DIAGNOSIS — R7401 Elevation of levels of liver transaminase levels: Secondary | ICD-10-CM | POA: Diagnosis not present

## 2024-04-19 DIAGNOSIS — L03115 Cellulitis of right lower limb: Secondary | ICD-10-CM | POA: Diagnosis not present

## 2024-04-19 DIAGNOSIS — T8149XA Infection following a procedure, other surgical site, initial encounter: Secondary | ICD-10-CM | POA: Diagnosis not present

## 2024-04-19 LAB — HEPATIC FUNCTION PANEL
ALT: 94 U/L — ABNORMAL HIGH (ref 0–44)
AST: 45 U/L — ABNORMAL HIGH (ref 15–41)
Albumin: 2.2 g/dL — ABNORMAL LOW (ref 3.5–5.0)
Alkaline Phosphatase: 145 U/L — ABNORMAL HIGH (ref 38–126)
Bilirubin, Direct: <0.1 mg/dL (ref 0.0–0.2)
Total Bilirubin: 0.3 mg/dL (ref 0.0–1.2)
Total Protein: 6.6 g/dL (ref 6.5–8.1)

## 2024-04-19 LAB — BASIC METABOLIC PANEL WITH GFR
Anion gap: 7 (ref 5–15)
BUN: 32 mg/dL — ABNORMAL HIGH (ref 8–23)
CO2: 28 mmol/L (ref 22–32)
Calcium: 9.3 mg/dL (ref 8.9–10.3)
Chloride: 100 mmol/L (ref 98–111)
Creatinine, Ser: 1.41 mg/dL — ABNORMAL HIGH (ref 0.61–1.24)
GFR, Estimated: 53 mL/min — ABNORMAL LOW (ref >60)
Glucose, Bld: 157 mg/dL — ABNORMAL HIGH (ref 70–99)
Potassium: 4.9 mmol/L (ref 3.5–5.1)
Sodium: 135 mmol/L (ref 135–145)

## 2024-04-19 LAB — GLUCOSE, CAPILLARY
Glucose-Capillary: 152 mg/dL — ABNORMAL HIGH (ref 70–99)
Glucose-Capillary: 171 mg/dL — ABNORMAL HIGH (ref 70–99)
Glucose-Capillary: 224 mg/dL — ABNORMAL HIGH (ref 70–99)
Glucose-Capillary: 224 mg/dL — ABNORMAL HIGH (ref 70–99)
Glucose-Capillary: 270 mg/dL — ABNORMAL HIGH (ref 70–99)
Glucose-Capillary: 392 mg/dL — ABNORMAL HIGH (ref 70–99)

## 2024-04-19 MED ORDER — SODIUM CHLORIDE 0.9 % IV SOLN
2.0000 g | Freq: Three times a day (TID) | INTRAVENOUS | Status: AC
  Administered 2024-04-19: 2 g via INTRAVENOUS
  Filled 2024-04-19: qty 12.5

## 2024-04-19 MED ORDER — LEVOFLOXACIN 750 MG PO TABS
750.0000 mg | ORAL_TABLET | Freq: Every day | ORAL | 0 refills | Status: AC
  Filled 2024-04-19: qty 11, 11d supply, fill #0

## 2024-04-19 MED ORDER — INSULIN ASPART 100 UNIT/ML IJ SOLN
0.0000 [IU] | Freq: Every day | INTRAMUSCULAR | Status: DC
  Administered 2024-04-19: 2 [IU] via SUBCUTANEOUS

## 2024-04-19 MED ORDER — INSULIN GLARGINE-YFGN 100 UNIT/ML ~~LOC~~ SOLN
20.0000 [IU] | Freq: Two times a day (BID) | SUBCUTANEOUS | Status: DC
  Administered 2024-04-19 – 2024-04-20 (×2): 20 [IU] via SUBCUTANEOUS
  Filled 2024-04-19 (×3): qty 0.2

## 2024-04-19 MED ORDER — LEVOFLOXACIN 750 MG PO TABS
750.0000 mg | ORAL_TABLET | Freq: Every day | ORAL | Status: DC
  Administered 2024-04-20 – 2024-04-22 (×3): 750 mg via ORAL
  Filled 2024-04-19 (×3): qty 1

## 2024-04-19 MED ORDER — INSULIN GLARGINE-YFGN 100 UNIT/ML ~~LOC~~ SOLN
10.0000 [IU] | Freq: Two times a day (BID) | SUBCUTANEOUS | Status: DC
  Administered 2024-04-19: 10 [IU] via SUBCUTANEOUS
  Filled 2024-04-19 (×2): qty 0.1

## 2024-04-19 MED ORDER — LINEZOLID 600 MG PO TABS
600.0000 mg | ORAL_TABLET | Freq: Two times a day (BID) | ORAL | 0 refills | Status: AC
  Filled 2024-04-19: qty 22, 11d supply, fill #0

## 2024-04-19 MED ORDER — INSULIN ASPART 100 UNIT/ML IJ SOLN
0.0000 [IU] | Freq: Three times a day (TID) | INTRAMUSCULAR | Status: DC
  Administered 2024-04-19: 3 [IU] via SUBCUTANEOUS
  Administered 2024-04-19: 2 [IU] via SUBCUTANEOUS
  Administered 2024-04-19: 5 [IU] via SUBCUTANEOUS
  Administered 2024-04-20: 3 [IU] via SUBCUTANEOUS
  Administered 2024-04-20: 1 [IU] via SUBCUTANEOUS
  Administered 2024-04-20: 5 [IU] via SUBCUTANEOUS
  Administered 2024-04-21: 2 [IU] via SUBCUTANEOUS
  Administered 2024-04-21: 5 [IU] via SUBCUTANEOUS

## 2024-04-19 MED ORDER — INSULIN ASPART 100 UNIT/ML IJ SOLN
2.0000 [IU] | Freq: Three times a day (TID) | INTRAMUSCULAR | Status: DC
  Administered 2024-04-19 – 2024-04-20 (×4): 2 [IU] via SUBCUTANEOUS

## 2024-04-19 NOTE — Progress Notes (Signed)
 [facility] CONSULT PROGRESS NOTE    Progress Note  CYPHER PAULE  ZOX:[id number] DOB: Mar 02, 1951 DOA: 04/12/2024 PCP: Colene Dauphin, MD     Brief Narrative:   Tim Torres is an 73 y.o. male male with past medical history of essential hypertension, diabetes mellitus type 2 coronary artery disease and gout who had right peroneal nerve decompression 03/30/2024, went for follow-up appointment with his neurosurgeon 04/12/2024 was found to have a wound incision drainage with a temperature of 103.2 started on antibiotics admitted to the ED infectious disease was consulted. We were consulted for diabetes management  Assessment/Plan:   Sepsis secondary to right lower extremity cellulitis /postoperative wound infection: Wound appears to be healing. Imaging show no osteomyelitis ultrasound showed no abscess. He has remained afebrile with no leukocytosis. Currently on Linezolid and cefepime. Continue further management per infectious disease. Will continue to follow along with you guys at a distance.    Elevated LFTs: Currently on empiric antibiotics abdominal ultrasound showed cholelithiasis gallbladder wall thickening but no finding of acute cholecystitis. HIDA scan was unremarkable shows no biliary obstruction or acute cholecystitis. LFTs are trending down, he denies any nausea vomiting or abdominal pain. Question if this is multifactorial secondary to antibiotics (Rocephin ).  Hyperglycemic hyperosmolar nonketotic state/diabetes mellitus type 2:  With last A1c of 7.1. Glucose was 400, started on sliding scale and long-acting insulin . Carb modified diet.  Glucose this morning is controlled.  Acute kidney injury on chronic kidney disease stage IIIa: Likely hemodynamically mediated with a baseline creatinine around 1.3. Started on IV fluids his creatinine has returned to baseline.  Pseudohyponatremia: Plus minus hypovolemic hyponatremia, now has normalized with fluid  resuscitation.  Normocytic anemia: Hemoglobin appears to be at baseline.  Essential hypertension: Continue metoprolol .  DVT prophylaxis: lovenox  Family Communication:none Status is: Inpatient Remains inpatient appropriate because: Factious etiology    Code Status:     Code Status Orders  (From admission, onward)           Start     Ordered   04/12/24 1702  Full code  Continuous       Question:  By:  Answer:  Procedural case: previous code status reviewed   04/12/24 1712           Code Status History     Date Active Date Inactive Code Status Order ID Comments User Context   07/20/2022 1618 07/21/2022 1955 Full Code [id number]  Lore Rode, PA-C Inpatient   06/27/2020 1059 06/28/2020 1525 Full Code [id number]  Agustina Aldrich, MD Inpatient   09/23/2019 2250 09/24/2019 1354 Full Code [id number]  Elouise Haley, MD ED         IV Access:   Peripheral IV   Procedures and diagnostic studies:   NM Hepato W/EF Result Date: 04/18/2024 CLINICAL DATA:  Cholelithiasis EXAM: NUCLEAR MEDICINE HEPATOBILIARY IMAGING WITH GALLBLADDER EF TECHNIQUE: Sequential images of the abdomen were obtained out to 60 minutes following intravenous administration of radiopharmaceutical. After oral ingestion of Ensure, gallbladder ejection fraction was determined. At 60 min, normal ejection fraction is greater than 33%. RADIOPHARMACEUTICALS:  5.0 mCi Tc-8m  Choletec IV COMPARISON:  04/18/2024 ultrasound FINDINGS: Prompt uptake and biliary excretion of activity by the liver is seen. Gallbladder activity is visualized, consistent with patency of cystic duct. Biliary activity passes into small bowel, consistent with patent common bile duct. Calculated gallbladder ejection fraction is 93%. (Normal gallbladder ejection fraction with Ensure is greater than 33% and less than 80%.) IMPRESSION: 1. No evidence  of biliary obstruction or acute cholecystitis. 2. Slightly elevated gallbladder ejection fraction after  fatty meal, a nonspecific finding that could be related to biliary hyperkinesia. Electronically Signed   By: Bobbye Burrow M.D.   On: 04/18/2024 18:08   US  Abdomen Limited RUQ (LIVER/GB) Result Date: 04/18/2024 CLINICAL DATA:  Transaminitis. EXAM: ULTRASOUND ABDOMEN LIMITED RIGHT UPPER QUADRANT COMPARISON:  None Available. FINDINGS: Gallbladder: A 2.3 cm shadowing echogenic gallstone is seen within the neck of the gallbladder. The gallbladder wall measures 4.3 mm in thickness. The presence or absence of a sonographic Abigail Abler sign was not documented by the sonographer. Common bile duct: Diameter: 2.1 mm Liver: No focal lesion identified. Within normal limits in parenchymal echogenicity. Portal vein is patent on color Doppler imaging with normal direction of blood flow towards the liver. Other: None. IMPRESSION: 1. Cholelithiasis and mild gallbladder wall thickening without additional findings to suggest the presence of acute cholecystitis. Further evaluation with a nuclear medicine hepatobiliary scan is recommended if this is of clinical concern. Electronically Signed   By: Virgle Grime M.D.   On: 04/18/2024 02:09     Medical Consultants:   None.   Subjective:    [name] denies any abdominal pain hungry this morning.  Objective:    Vitals:   04/18/24 1119 04/18/24 1722 04/18/24 2000 04/19/24 0412  BP: 130/76 (!) 147/81 (!) 144/68 (!) 147/87  Pulse: 71 82 78 72  Resp: 18  18 18   Temp: 98.3 F (36.8 C) 98.4 F (36.9 C) 98.4 F (36.9 C) 97.8 F (36.6 C)  TempSrc: Oral Oral Oral Oral  SpO2: 96% 98% 98% 99%  Weight:      Height:       SpO2: 99 % O2 Flow Rate (L/min): 0 L/min FiO2 (%): 21 %  No intake or output data in the 24 hours ending 04/19/24 0702 Filed Weights   04/12/24 1720  Weight: 110.2 kg    Exam: General exam: In no acute distress. Respiratory system: Good air movement and clear to auscultation. Cardiovascular system: S1 & S2 heard, RRR. No  JVD. Gastrointestinal system: Abdomen is nondistended, soft and nontender.  Extremities: No pedal edema. Skin: No rashes, lesions or ulcers Psychiatry: Judgement and insight appear normal. Mood & affect appropriate.    Data Reviewed:    Labs: Basic Metabolic Panel: Recent Labs  Lab 04/12/24 1810 04/16/24 1631 04/18/24 0923 04/18/24 1938  NA 134* 131* 129*  --   K 4.5 4.9 4.7  --   CL 100 94* 96*  --   CO2 21* 26 24  --   GLUCOSE 222* 386* 467*  --   BUN 29* 35* 33*  --   CREATININE 1.69* 1.54* 1.44*  --   CALCIUM 9.6 9.2 9.4  --   MG  --   --   --  2.0   GFR Estimated Creatinine Clearance: 60.3 mL/min (A) (by C-G formula based on SCr of 1.44 mg/dL (H)). Liver Function Tests: Recent Labs  Lab 04/16/24 1631 04/18/24 0923  AST 188* 75*  ALT 167* 128*  ALKPHOS 164* 147*  BILITOT 0.6 0.5  PROT 6.9 6.9  ALBUMIN 2.7* 2.3*   No results for input(s): "LIPASE", "AMYLASE" in the last 168 hours. No results for input(s): "AMMONIA" in the last 168 hours. Coagulation profile No results for input(s): "INR", "PROTIME" in the last 168 hours. COVID-19 Labs  No results for input(s): "DDIMER", "FERRITIN", "LDH", "CRP" in the last 72 hours.  Lab Results  Component Value  Date   SARSCOV2NAA NEGATIVE 06/24/2020   SARSCOV2NAA NEGATIVE 09/23/2019    CBC: Recent Labs  Lab 04/12/24 1810 04/17/24 1031 04/18/24 0923  WBC 11.0* 7.4 6.5  NEUTROABS 8.7* 6.0 4.8  HGB 13.0 11.6* 11.2*  HCT 39.0 35.0* 33.2*  MCV 92.9 91.6 92.0  PLT 185 204 191   Cardiac Enzymes: Recent Labs  Lab 04/18/24 1938  CKTOTAL 29*   BNP (last 3 results) No results for input(s): "PROBNP" in the last 8760 hours. CBG: Recent Labs  Lab 04/18/24 1718 04/18/24 2118 04/19/24 0005 04/19/24 0531  GLUCAP 376* 464* 392* 171*   D-Dimer: No results for input(s): "DDIMER" in the last 72 hours. Hgb A1c: Recent Labs    04/18/24 1938  HGBA1C 8.1*   Lipid Profile: No results for input(s): "CHOL",  "HDL", "LDLCALC", "TRIG", "CHOLHDL", "LDLDIRECT" in the last 72 hours. Thyroid function studies: No results for input(s): "TSH", "T4TOTAL", "T3FREE", "THYROIDAB" in the last 72 hours.  Invalid input(s): "FREET3" Anemia work up: No results for input(s): "VITAMINB12", "FOLATE", "FERRITIN", "TIBC", "IRON", "RETICCTPCT" in the last 72 hours. Sepsis Labs: Recent Labs  Lab 04/12/24 1810 04/17/24 1031 04/18/24 0923  WBC 11.0* 7.4 6.5   Microbiology Recent Results (from the past 240 hours)  Culture, blood (Routine X 2) w Reflex to ID Panel     Status: None   Collection Time: 04/12/24  6:10 PM   Specimen: BLOOD  Result Value Ref Range Status   Specimen Description BLOOD LEFT ANTECUBITAL  Final   Special Requests   Final    BOTTLES DRAWN AEROBIC AND ANAEROBIC Blood Culture results may not be optimal due to an inadequate volume of blood received in culture bottles   Culture   Final    NO GROWTH 5 DAYS Performed at [facility], [address], [place], [place] [address]    Report Status 04/17/2024 FINAL  Final  Culture, blood (Routine X 2) w Reflex to ID Panel     Status: Abnormal   Collection Time: 04/12/24  6:10 PM   Specimen: BLOOD  Result Value Ref Range Status   Specimen Description BLOOD RIGHT ANTECUBITAL  Final   Special Requests   Final    BOTTLES DRAWN AEROBIC AND ANAEROBIC Blood Culture results may not be optimal due to an inadequate volume of blood received in culture bottles   Culture  Setup Time   Final    GRAM POSITIVE COCCI ANAEROBIC BOTTLE ONLY CRITICAL RESULT CALLED TO, READ BACK BY AND VERIFIED WITH: PHARMD MICHAEL BITONTI [id number] AT 2030 BY EC    Culture (A)  Final    STAPHYLOCOCCUS CAPITIS THE SIGNIFICANCE OF ISOLATING THIS ORGANISM FROM A SINGLE SET OF BLOOD CULTURES WHEN MULTIPLE SETS ARE DRAWN IS UNCERTAIN. PLEASE NOTIFY THE MICROBIOLOGY DEPARTMENT WITHIN ONE WEEK IF SPECIATION AND SENSITIVITIES ARE REQUIRED. Performed at [facility], [address], [place], [place] [address]    Report Status 04/16/2024 FINAL  Final  Blood Culture ID Panel (Reflexed)     Status: Abnormal   Collection Time: 04/12/24  6:10 PM  Result Value Ref Range Status   Enterococcus faecalis NOT DETECTED NOT DETECTED Final   Enterococcus Faecium NOT DETECTED NOT DETECTED Final   Listeria monocytogenes NOT DETECTED NOT DETECTED Final   Staphylococcus species DETECTED (A) NOT DETECTED Final    Comment: CRITICAL RESULT CALLED TO, READ BACK BY AND VERIFIED WITH: PHARMD MICHAEL BITONTI [id number] AT 2030 BY EC    Staphylococcus aureus (BCID) NOT DETECTED NOT  DETECTED Final   Staphylococcus epidermidis NOT DETECTED NOT DETECTED Final   Staphylococcus lugdunensis NOT DETECTED NOT DETECTED Final   Streptococcus species NOT DETECTED NOT DETECTED Final   Streptococcus agalactiae NOT DETECTED NOT DETECTED Final   Streptococcus pneumoniae NOT DETECTED NOT DETECTED Final   Streptococcus pyogenes NOT DETECTED NOT DETECTED Final   A.calcoaceticus-baumannii NOT DETECTED NOT DETECTED Final   Bacteroides fragilis NOT DETECTED NOT DETECTED Final   Enterobacterales NOT DETECTED NOT DETECTED Final   Enterobacter cloacae complex NOT DETECTED NOT DETECTED Final   Escherichia coli NOT DETECTED NOT DETECTED Final   Klebsiella aerogenes NOT DETECTED NOT DETECTED Final   Klebsiella oxytoca NOT DETECTED NOT DETECTED Final   Klebsiella pneumoniae NOT DETECTED NOT DETECTED Final   Proteus species NOT DETECTED NOT DETECTED Final   Salmonella species NOT DETECTED NOT DETECTED Final   Serratia marcescens NOT DETECTED NOT DETECTED Final   Haemophilus influenzae NOT DETECTED NOT DETECTED Final   Neisseria meningitidis NOT DETECTED NOT DETECTED Final   Pseudomonas aeruginosa NOT DETECTED NOT DETECTED Final   Stenotrophomonas maltophilia NOT DETECTED NOT DETECTED Final   Candida albicans NOT DETECTED NOT DETECTED Final   Candida auris NOT DETECTED NOT DETECTED Final   Candida glabrata NOT  DETECTED NOT DETECTED Final   Candida krusei NOT DETECTED NOT DETECTED Final   Candida parapsilosis NOT DETECTED NOT DETECTED Final   Candida tropicalis NOT DETECTED NOT DETECTED Final   Cryptococcus neoformans/gattii NOT DETECTED NOT DETECTED Final    Comment: Performed at [facility], [address] [place], [place] [address]     Medications:    allopurinol   300 mg Oral Daily   cholecalciferol   1,000 Units Oral Daily   cyanocobalamin   500 mcg Oral Daily   docusate sodium   100 mg Oral BID   gabapentin   300 mg Oral TID   heparin  injection (subcutaneous)  5,000 Units Subcutaneous Q8H   insulin  aspart  0-5 Units Subcutaneous QHS   insulin  aspart  0-9 Units Subcutaneous TID WC   insulin  aspart  2 Units Subcutaneous TID WC   insulin  aspart  4 Units Subcutaneous TID WC   insulin  glargine-yfgn  10 Units Subcutaneous BID   linezolid  600 mg Oral Q12H   magnesium  oxide  400 mg Oral QHS   metoprolol  tartrate  50 mg Oral BID   pantoprazole   40 mg Oral Daily   pravastatin   80 mg Oral q1800   Continuous Infusions:  sodium chloride  75 mL/hr at 04/19/24 0243   ceFEPime (MAXIPIME) IV 2 g (04/18/24 2112)      LOS: 7 days   Macdonald Savoy  [facility]  04/19/2024, 7:02 AM

## 2024-04-19 NOTE — Plan of Care (Signed)

## 2024-04-19 NOTE — Progress Notes (Signed)
 [facility]    Date of Admission:  04/12/2024     ID: Tim Torres is a 73 y.o. male with   Principal Problem:   Postoperative wound infection Active Problems:   HTN (hypertension)   HLD (hyperlipidemia)   DM2 (diabetes mellitus, type 2) (HCC)   CKD (chronic kidney disease) stage 3, GFR 30-59 ml/min (HCC)   Cellulitis of right lower extremity   Sepsis (HCC)    Subjective: Denies fever, less swelling. More range of motion. And less drainage to right leg wound  HIDA scan did not show any obstruction; LFTs improving on labs:BS also improving since addition of insulin   Medications:   allopurinol   300 mg Oral Daily   cholecalciferol   1,000 Units Oral Daily   cyanocobalamin   500 mcg Oral Daily   docusate sodium   100 mg Oral BID   gabapentin   300 mg Oral TID   heparin  injection (subcutaneous)  5,000 Units Subcutaneous Q8H   insulin  aspart  0-5 Units Subcutaneous QHS   insulin  aspart  0-9 Units Subcutaneous TID WC   insulin  aspart  2 Units Subcutaneous TID WC   insulin  glargine-yfgn  10 Units Subcutaneous BID   linezolid  600 mg Oral Q12H   magnesium  oxide  400 mg Oral QHS   metoprolol  tartrate  50 mg Oral BID   pantoprazole   40 mg Oral Daily   pravastatin   80 mg Oral q1800    Objective: Vital signs in last 24 hours: Temp:  [97.8 F (36.6 C)-98.6 F (37 C)] 98.6 F (37 C) (06/05 1134) Pulse Rate:  [71-82] 71 (06/05 1134) Resp:  [16-19] 19 (06/05 1134) BP: (138-156)/(68-87) 138/75 (06/05 1134) SpO2:  [98 %-99 %] 99 % (06/05 1134) Physical Exam  Constitutional: He is oriented to person, place, and time. He appears well-developed and well-nourished. No distress.  HENT:  Mouth/Throat: Oropharynx is clear and moist. No oropharyngeal exudate.  Cardiovascular: Normal rate, regular rhythm and normal heart sounds. Exam reveals no gallop and no friction rub.  No murmur heard.  Pulmonary/Chest: Effort normal and breath sounds normal. No  respiratory distress. He has no wheezes.  Abdominal: Soft. Bowel sounds are normal. He exhibits no distension. There is no tenderness.  XLK:GMWNU leg wrapped Neurological: He is alert and oriented to person, place, and time.  Skin: Skin is warm and dry. No rash noted. No erythema.  Psychiatric: He has a normal mood and affect. His behavior is normal.    Lab Results Recent Labs    04/17/24 1031 04/18/24 0923 04/19/24 0556  WBC 7.4 6.5  --   HGB 11.6* 11.2*  --   HCT 35.0* 33.2*  --   NA  --  129* 135  K  --  4.7 4.9  CL  --  96* 100  CO2  --  24 28  BUN  --  33* 32*  CREATININE  --  1.44* 1.41*   Liver Panel Recent Labs    04/18/24 0923 04/19/24 0556  PROT 6.9 6.6  ALBUMIN 2.3* 2.2*  AST 75* 45*  ALT 128* 94*  ALKPHOS 147* 145*  BILITOT 0.5 0.3  BILIDIR  --  <0.1  IBILI  --  NOT CALCULATED   Sedimentation Rate No results for input(s): "ESRSEDRATE" in the last 72 hours. C-Reactive Protein No results for input(s): "CRP" in the last 72 hours.  Microbiology: reviewed Studies/Results: NM Hepato W/EF Result Date: 04/18/2024 CLINICAL DATA:  Cholelithiasis EXAM: NUCLEAR MEDICINE HEPATOBILIARY IMAGING WITH  GALLBLADDER EF TECHNIQUE: Sequential images of the abdomen were obtained out to 60 minutes following intravenous administration of radiopharmaceutical. After oral ingestion of Ensure, gallbladder ejection fraction was determined. At 60 min, normal ejection fraction is greater than 33%. RADIOPHARMACEUTICALS:  5.0 mCi Tc-35m  Choletec IV COMPARISON:  04/18/2024 ultrasound FINDINGS: Prompt uptake and biliary excretion of activity by the liver is seen. Gallbladder activity is visualized, consistent with patency of cystic duct. Biliary activity passes into small bowel, consistent with patent common bile duct. Calculated gallbladder ejection fraction is 93%. (Normal gallbladder ejection fraction with Ensure is greater than 33% and less than 80%.) IMPRESSION: 1. No evidence of biliary  obstruction or acute cholecystitis. 2. Slightly elevated gallbladder ejection fraction after fatty meal, a nonspecific finding that could be related to biliary hyperkinesia. Electronically Signed   By: Bobbye Burrow M.D.   On: 04/18/2024 18:08   US  Abdomen Limited RUQ (LIVER/GB) Result Date: 04/18/2024 CLINICAL DATA:  Transaminitis. EXAM: ULTRASOUND ABDOMEN LIMITED RIGHT UPPER QUADRANT COMPARISON:  None Available. FINDINGS: Gallbladder: A 2.3 cm shadowing echogenic gallstone is seen within the neck of the gallbladder. The gallbladder wall measures 4.3 mm in thickness. The presence or absence of a sonographic Abigail Abler sign was not documented by the sonographer. Common bile duct: Diameter: 2.1 mm Liver: No focal lesion identified. Within normal limits in parenchymal echogenicity. Portal vein is patent on color Doppler imaging with normal direction of blood flow towards the liver. Other: None. IMPRESSION: 1. Cholelithiasis and mild gallbladder wall thickening without additional findings to suggest the presence of acute cholecystitis. Further evaluation with a nuclear medicine hepatobiliary scan is recommended if this is of clinical concern. Electronically Signed   By: Virgle Grime M.D.   On: 04/18/2024 02:09     Assessment/Plan: Post operative wound to right calf = continue with iv therapy and will change to levofloxacin 750mg  po daily to start tomorrow morning and continue with linezolid 600mg  po bid. Plan for 14 day course  Continue with wound care per dr pool.  Transaminitis= suspect that GB sludge that contributed to elevated LFTs and ALP. Now improving. Possible related to 5 days of ceftriaxone   T2DM = appears improving with addition of insulin  for which he may need at discharge  Will sign off.  [facility] Pager: [phone]  04/19/2024, 1:34 PM

## 2024-04-19 NOTE — Progress Notes (Signed)
 Working and feeling much better today.  Pain well-controlled.  Moving his foot without significant pain or discomfort.  He is afebrile.  His vital signs are stable.  He is awake and alert.  He is oriented and appropriate.  Motor and sensory function are intact.  Wound looks significantly better today.  Less drainage.  Less erythema.  Much less tenderness.  Overall responding well to antibiotics and local wound care.  Continue efforts at mobilization.  HIDA scan negative for significant cholecystitis or obstructive process.  Blood glucose management improved.  Status post peroneal nerve decompression with postoperative superficial wound infection.  Continue antibiotics.  Work on mobilization.  Likely discharge tomorrow.

## 2024-04-19 NOTE — Progress Notes (Signed)
 PHARMACY NOTE:  ANTIMICROBIAL RENAL DOSAGE ADJUSTMENT  Current antimicrobial regimen includes a mismatch between antimicrobial dosage and estimated renal function.  As per policy approved by the Pharmacy & Therapeutics and Medical Executive Committees, the antimicrobial dosage will be adjusted accordingly.  Current antimicrobial dosage:  Cefepime 2g IV Q1@H    Indication: Post-op wound infection   Renal Function:  Estimated Creatinine Clearance: 61.6 mL/min (A) (by C-G formula based on SCr of 1.41 mg/dL (H)). []      On intermittent HD, scheduled: []      On CRRT    Antimicrobial dosage has been changed to:  Cefepime 2g IV Q8H   Additional comments: Renal function borderline for dose adjustment, however CrCl at 61 mL/min x 2 days   Thank you for allowing pharmacy to be a part of this patient's care.  Chrystie Crass, PharmD Clinical Pharmacist  04/19/2024 11:14 AM

## 2024-04-19 NOTE — Telephone Encounter (Signed)
 Patient Product/process development scientist completed.    The patient is insured through [employer]. Patient has Medicare and is not eligible for a copay card, but may be able to apply for patient assistance or Medicare RX Payment Plan (Patient Must reach out to their plan, if eligible for payment plan), if available.    Ran test claim for linezolid 600 mg and the current 14 day co-pay is $68.23.  Ran test claim for levofloxacin 750 mg and the current 14 day co-pay is $5.00.  This test claim was processed through [facility]- copay amounts may vary at other pharmacies due to pharmacy/plan contracts, or as the patient moves through the different stages of their insurance plan.     Morgan Arab, CPHT Pharmacy Technician III Certified Patient Advocate [facility] Patient Advocate Team Direct Number: [phone]  Fax: [phone]

## 2024-04-20 DIAGNOSIS — T8149XA Infection following a procedure, other surgical site, initial encounter: Secondary | ICD-10-CM | POA: Diagnosis not present

## 2024-04-20 LAB — BASIC METABOLIC PANEL WITH GFR
Anion gap: 9 (ref 5–15)
BUN: 28 mg/dL — ABNORMAL HIGH (ref 8–23)
CO2: 24 mmol/L (ref 22–32)
Calcium: 8.9 mg/dL (ref 8.9–10.3)
Chloride: 98 mmol/L (ref 98–111)
Creatinine, Ser: 1.61 mg/dL — ABNORMAL HIGH (ref 0.61–1.24)
GFR, Estimated: 45 mL/min — ABNORMAL LOW (ref >60)
Glucose, Bld: 231 mg/dL — ABNORMAL HIGH (ref 70–99)
Potassium: 5.3 mmol/L — ABNORMAL HIGH (ref 3.5–5.1)
Sodium: 131 mmol/L — ABNORMAL LOW (ref 135–145)

## 2024-04-20 LAB — GLUCOSE, CAPILLARY
Glucose-Capillary: 238 mg/dL — ABNORMAL HIGH (ref 70–99)
Glucose-Capillary: 264 mg/dL — ABNORMAL HIGH (ref 70–99)
Glucose-Capillary: 142 mg/dL — ABNORMAL HIGH (ref 70–99)
Glucose-Capillary: 101 mg/dL — ABNORMAL HIGH (ref 70–99)

## 2024-04-20 MED ORDER — SODIUM CHLORIDE 0.9 % IV SOLN: INTRAVENOUS | Status: DC

## 2024-04-20 MED ORDER — GLIPIZIDE 5 MG PO TABS: 5.0000 mg | ORAL_TABLET | Freq: Two times a day (BID) | ORAL | Status: DC

## 2024-04-20 MED ORDER — INSULIN GLARGINE-YFGN 100 UNIT/ML ~~LOC~~ SOLN
25.0000 [IU] | Freq: Two times a day (BID) | SUBCUTANEOUS | Status: DC
  Administered 2024-04-20 – 2024-04-22 (×4): 25 [IU] via SUBCUTANEOUS
  Filled 2024-04-20 (×5): qty 0.25

## 2024-04-20 MED ORDER — SODIUM ZIRCONIUM CYCLOSILICATE 10 G PO PACK
10.0000 g | PACK | Freq: Two times a day (BID) | ORAL | Status: AC
  Filled 2024-04-20: qty 1

## 2024-04-20 MED ORDER — METFORMIN HCL 500 MG PO TABS
1000.0000 mg | ORAL_TABLET | Freq: Two times a day (BID) | ORAL | Status: DC
  Administered 2024-04-20 – 2024-04-22 (×5): 1000 mg via ORAL
  Filled 2024-04-20 (×5): qty 2

## 2024-04-20 MED ORDER — GLIPIZIDE 5 MG PO TABS
5.0000 mg | ORAL_TABLET | Freq: Two times a day (BID) | ORAL | Status: DC
  Administered 2024-04-20 – 2024-04-21 (×2): 5 mg via ORAL
  Filled 2024-04-20 (×2): qty 1

## 2024-04-20 MED ORDER — METFORMIN HCL 500 MG PO TABS: 1000.0000 mg | ORAL_TABLET | Freq: Two times a day (BID) | ORAL | Status: DC

## 2024-04-20 MED ORDER — INSULIN ASPART 100 UNIT/ML IJ SOLN
3.0000 [IU] | Freq: Three times a day (TID) | INTRAMUSCULAR | Status: DC
  Administered 2024-04-20 – 2024-04-22 (×4): 3 [IU] via SUBCUTANEOUS

## 2024-04-20 NOTE — Progress Notes (Signed)
 Physical Therapy Treatment Patient Details Name: Tim Torres MRN: [id number] DOB: 1951-09-16 Today's Date: 04/20/2024   History of Present Illness Pt is 73 yo presenting to [facility] for s/p surgical infection. Pt had R peroneal nerve decompression on 5/16. Did well for 4 days prior to noticing drainage from incision site. PMH: Arthritis, DM, HTN, MI, sleep apnea    PT Comments  Pt currently is not progressing towards goals due to he is limited by pain in the cervical spine. Pt reports improvement in symptoms with light traction, limited range extension and scapular retractions. Pt was Min A for bed mobility with heavy use of UE this session. Pt educated on importance of trying to avoid such heavy use of bil UE this session in order to decrease tightness at the cervical spine. Due to pt current functional status, home set up and available assistance at home recommending skilled physical therapy services 3x/week in order to address strength, balance and functional mobility to decrease risk for falls, injury and re-hospitalization.       If plan is discharge home, recommend the following: A little help with walking and/or transfers;Assistance with cooking/housework;Assist for transportation;Help with stairs or ramp for entrance     Equipment Recommendations  None recommended by PT       Precautions / Restrictions Precautions Precautions: Fall Recall of Precautions/Restrictions: Intact Restrictions Weight Bearing Restrictions Per Provider Order: No     Mobility  Bed Mobility Overal bed mobility: Needs Assistance Bed Mobility: Supine to Sit, Sit to Supine     Supine to sit: Min assist Sit to supine: Contact guard assist   General bed mobility comments: Min A to get trunk to mid line with hand held support, CGA to get back to bed increased time, heavy use of rails.    Transfers     General transfer comment: deferred due to pain in the cervical spine pt declined.     Ambulation/Gait     General Gait Details: deferred due to pain in the cervical spine pt declined.      Balance Overall balance assessment: Needs assistance Sitting-balance support: Single extremity supported, Feet supported Sitting balance-Leahy Scale: Fair         Standing balance comment: did not get to standing todayd ue to pain      Communication Communication Communication: No apparent difficulties  Cognition Arousal: Alert Behavior During Therapy: WFL for tasks assessed/performed   PT - Cognitive impairments: No apparent impairments       Following commands: Intact      Cueing Cueing Techniques: Verbal cues     General Comments General comments (skin integrity, edema, etc.): Light sub occipital traction with pt reporting decreased pain that returned without traction. Pt worked on scapular retractions that improved pain in the thoracic spine but continued with significant pain at the suboccipital area. Pt was able to tolerate limited ROM into cervical extension passively reporting a "pop" that hurt but then relieved symptoms. Pt encouraged to perform easy ROM of the cervical spine avoiding any radiating symptoms in order to decrease stiffness of the cervical spine and to try to not use UE as heavily as he has been using UE. Pt heavily has used upper extremities during all physical therapy eval and tx this hospitalization. RN notified MD of pt pain.      Pertinent Vitals/Pain Pain Assessment Pain Assessment: Faces Faces Pain Scale: Hurts whole lot Breathing: occasional labored breathing, short period of hyperventilation Negative Vocalization: occasional moan/groan, low speech, negative/disapproving  quality Facial Expression: facial grimacing Body Language: tense, distressed pacing, fidgeting Consolability: no need to console PAINAD Score: 5 Pain Location: posterior cervical spine bil Pain Descriptors / Indicators: Aching, Burning, Sharp Pain Intervention(s):  Monitored during session, Limited activity within patient's tolerance     PT Goals (current goals can now be found in the care plan section) Acute Rehab PT Goals Patient Stated Goal: to return home and decrease pain PT Goal Formulation: With patient Time For Goal Achievement: 05/02/24 Potential to Achieve Goals: Good Progress towards PT goals: Not progressing toward goals - comment (limited today due to cervical pain)    Frequency    Min 2X/week      PT Plan  Continue with current POC        AM-PAC PT "6 Clicks" Mobility   Outcome Measure  Help needed turning from your back to your side while in a flat bed without using bedrails?: A Little Help needed moving from lying on your back to sitting on the side of a flat bed without using bedrails?: A Little Help needed moving to and from a bed to a chair (including a wheelchair)?: A Little Help needed standing up from a chair using your arms (e.g., wheelchair or bedside chair)?: A Little Help needed to walk in hospital room?: A Little Help needed climbing 3-5 steps with a railing? : A Lot 6 Click Score: 17    End of Session   Activity Tolerance: Patient limited by pain Patient left: with call bell/phone within reach;in bed;with bed alarm set Nurse Communication: Mobility status PT Visit Diagnosis: Unsteadiness on feet (R26.81);Other abnormalities of gait and mobility (R26.89);Pain Pain - Right/Left: Right (bil) Pain - part of body: Leg (suboccipital and thoracic area)     Time: 1610-9604 PT Time Calculation (min) (ACUTE ONLY): 23 min  Charges:    $Therapeutic Exercise: 8-22 mins $Therapeutic Activity: 8-22 mins PT General Charges $$ ACUTE PT VISIT: 1 Visit                     Sloan Duncans, DPT, CLT  Acute Rehabilitation Services Office: [phone] (Secure chat preferred)    [name] 04/20/2024, 11:46 AM

## 2024-04-20 NOTE — TOC Progression Note (Signed)
 Transition of Care [facility]) - Progression Note    Patient Details  Name: Tim Torres MRN: [id number] Date of Birth: 10-02-1951  [facility] of [facility] CM/SW Contact  Jonathan Neighbor, RN Phone Number: 04/20/2024, 1:52 PM  Clinical Narrative:     Plan is dc tomorrow with HH through [facility]. Pt will need dressing supplies for a few days and him and SO need to be taught dressing changes. Bedside RN aware.  TOC following.   Expected Discharge Plan: Home w Home Health Services Barriers to Discharge: Continued Medical Work up  Expected Discharge Plan and Services   Discharge Planning Services: CM Consult Post Acute Care Choice: Home Health Living arrangements for the past 2 months: Single Family Home                           HH Arranged: PT, RN [facility] Agency: [facility] Date [facility] Agency Contacted: 04/18/24   Representative spoke with at [facility] Agency: Randel Buss   Social Determinants of Health (SDOH) Interventions SDOH Screenings   Food Insecurity: No Food Insecurity (04/12/2024)  Housing: Low Risk  (04/12/2024)  Transportation Needs: No Transportation Needs (04/13/2024)  Utilities: Not At Risk (04/13/2024)  Alcohol  Screen: Low Risk  (04/16/2024)  Depression (PHQ2-9): Low Risk  (01/23/2024)  Financial Resource Strain: Low Risk  (04/16/2024)  Physical Activity: Inactive (04/16/2024)  Social Connections: Socially Integrated (04/13/2024)  Stress: No Stress Concern Present (04/16/2024)  Tobacco Use: Medium Risk (04/16/2024)  Health Literacy: Adequate Health Literacy (04/16/2024)    Readmission Risk Interventions     No data to display

## 2024-04-20 NOTE — Progress Notes (Signed)
 Patient and patient's wife educated on the lokelma medication to reduce patient's potassium of 5.3. Patient verbalize understanding and refused lokelma medication. MD made aware.

## 2024-04-20 NOTE — Plan of Care (Signed)

## 2024-04-20 NOTE — Progress Notes (Signed)
 [facility] CONSULT PROGRESS NOTE    Progress Note  Tim Torres  QIO:[id number] DOB: Jul 25, 1951 DOA: 04/12/2024 PCP: Colene Dauphin, MD     Brief Narrative:   Tim Torres is an 73 y.o. male male with past medical history of essential hypertension, diabetes mellitus type 2 coronary artery disease and gout who had right peroneal nerve decompression 03/30/2024, went for follow-up appointment with his neurosurgeon 04/12/2024 was found to have a wound incision drainage with a temperature of 103.2 started on antibiotics admitted to the ED infectious disease was consulted. We were consulted for diabetes management  Assessment/Plan:   Sepsis secondary to right lower extremity cellulitis /postoperative wound infection: Wound appears to be healing. Imaging show no osteomyelitis ultrasound showed no abscess. He has remained afebrile with no leukocytosis. Now transition to oral levofloxacin for 14 days further management per ID.    Elevated LFTs: Abdominal ultrasound showed no acute cholecystitis HIDA scan was unremarkable shows no biliary obstruction or acute cholecystitis. Question if this is multifactorial secondary to antibiotics (Rocephin ).  Hyperglycemic hyperosmolar nonketotic state/diabetes mellitus type 2:  With last A1c of 8.1. Patient is adamant about being on insulin  he has been very disrespectful implying the we do not know what you are doing and he is not taking insulin  at home. Acting very indifferent when having conversation. Blood glucose improved discontinue long-acting insulin  start him on metformin  and low-dose glipizide twice a day first dose now. He could go home on: metformin  1000 mg twice a day Glipizide 5 mg twice daily Follow-up with PCP and titrate as needed. PCP needs to have a long discussion with the patient as his GFR is just above 40 mL/min.  Acute kidney injury on chronic kidney disease stage IIIa: Likely hemodynamically mediated with a  baseline creatinine around 1.3. Started on IV fluids his creatinine has returned to baseline.  Mild Hyperkalemia: Start IV fluids give Lokelma recheck basic metabolic panel in the morning. If potassium is improved tomorrow, he should be good to go  Pseudohyponatremia: Plus minus hypovolemic hyponatremia, now has normalized with fluid resuscitation.  Normocytic anemia: Hemoglobin appears to be at baseline.  Essential hypertension: Continue metoprolol .  DVT prophylaxis: lovenox  Family Communication:none Status is: Inpatient Remains inpatient appropriate because: Factious etiology    Code Status:     Code Status Orders  (From admission, onward)           Start     Ordered   04/12/24 1702  Full code  Continuous       Question:  By:  Answer:  Procedural case: previous code status reviewed   04/12/24 1712           Code Status History     Date Active Date Inactive Code Status Order ID Comments User Context   07/20/2022 1618 07/21/2022 1955 Full Code [id number]  Lore Rode, PA-C Inpatient   06/27/2020 1059 06/28/2020 1525 Full Code [id number]  Agustina Aldrich, MD Inpatient   09/23/2019 2250 09/24/2019 1354 Full Code [id number]  Elouise Haley, MD ED         IV Access:   Peripheral IV   Procedures and diagnostic studies:   NM Hepato W/EF Result Date: 04/18/2024 CLINICAL DATA:  Cholelithiasis EXAM: NUCLEAR MEDICINE HEPATOBILIARY IMAGING WITH GALLBLADDER EF TECHNIQUE: Sequential images of the abdomen were obtained out to 60 minutes following intravenous administration of radiopharmaceutical. After oral ingestion of Ensure, gallbladder ejection fraction was determined. At 60 min, normal ejection fraction is greater than 33%. RADIOPHARMACEUTICALS:  5.0 mCi Tc-31m  Choletec IV COMPARISON:  04/18/2024 ultrasound FINDINGS: Prompt uptake and biliary excretion of activity by the liver is seen. Gallbladder activity is visualized, consistent with patency of cystic duct. Biliary  activity passes into small bowel, consistent with patent common bile duct. Calculated gallbladder ejection fraction is 93%. (Normal gallbladder ejection fraction with Ensure is greater than 33% and less than 80%.) IMPRESSION: 1. No evidence of biliary obstruction or acute cholecystitis. 2. Slightly elevated gallbladder ejection fraction after fatty meal, a nonspecific finding that could be related to biliary hyperkinesia. Electronically Signed   By: Bobbye Burrow M.D.   On: 04/18/2024 18:08     Medical Consultants:   None.   Subjective:    Tim Torres not in a good mood this morning disrespectful  Objective:    Vitals:   04/20/24 0637 04/20/24 0718 04/20/24 0925 04/20/24 1138  BP:  (!) 144/83 (!) 152/76 127/79  Pulse:  79 73 67  Resp:  17  19  Temp:  98.3 F (36.8 C)  98.1 F (36.7 C)  TempSrc:  Oral  Oral  SpO2:  96%  95%  Weight: 110 kg     Height:       SpO2: 95 % O2 Flow Rate (L/min): 0 L/min FiO2 (%): 21 %   Intake/Output Summary (Last 24 hours) at 04/20/2024 1159 Last data filed at 04/20/2024 0930 Gross per 24 hour  Intake 100 ml  Output 1700 ml  Net -1600 ml   Filed Weights   04/12/24 1720 04/20/24 0637  Weight: 110.2 kg 110 kg    Exam: General exam: In no acute distress. Respiratory system: Good air movement and clear to auscultation. Cardiovascular system: S1 & S2 heard, RRR. No JVD. Gastrointestinal system: Abdomen is nondistended, soft and nontender.  Extremities: No pedal edema. Skin: No rashes, lesions or ulcers Psychiatry: Judgement and insight appear normal. Mood & affect appropriate.  Data Reviewed:    Labs: Basic Metabolic Panel: Recent Labs  Lab 04/16/24 1631 04/18/24 0923 04/18/24 1938 04/19/24 0556 04/20/24 0327  NA 131* 129*  --  135 131*  K 4.9 4.7  --  4.9 5.3*  CL 94* 96*  --  100 98  CO2 26 24  --  28 24  GLUCOSE 386* 467*  --  157* 231*  BUN 35* 33*  --  32* 28*  CREATININE 1.54* 1.44*  --  1.41* 1.61*  CALCIUM  9.2 9.4  --  9.3 8.9  MG  --   --  2.0  --   --    GFR Estimated Creatinine Clearance: 53.9 mL/min (A) (by C-G formula based on SCr of 1.61 mg/dL (H)). Liver Function Tests: Recent Labs  Lab 04/16/24 1631 04/18/24 0923 04/19/24 0556  AST 188* 75* 45*  ALT 167* 128* 94*  ALKPHOS 164* 147* 145*  BILITOT 0.6 0.5 0.3  PROT 6.9 6.9 6.6  ALBUMIN 2.7* 2.3* 2.2*   No results for input(s): "LIPASE", "AMYLASE" in the last 168 hours. No results for input(s): "AMMONIA" in the last 168 hours. Coagulation profile No results for input(s): "INR", "PROTIME" in the last 168 hours. COVID-19 Labs  No results for input(s): "DDIMER", "FERRITIN", "LDH", "CRP" in the last 72 hours.  Lab Results  Component Value Date   SARSCOV2NAA NEGATIVE 06/24/2020   SARSCOV2NAA NEGATIVE 09/23/2019    CBC: Recent Labs  Lab 04/17/24 1031 04/18/24 0923  WBC 7.4 6.5  NEUTROABS 6.0 4.8  HGB 11.6* 11.2*  HCT 35.0* 33.2*  MCV 91.6 92.0  PLT 204 191   Cardiac Enzymes: Recent Labs  Lab 04/18/24 1938  CKTOTAL 29*   BNP (last 3 results) No results for input(s): "PROBNP" in the last 8760 hours. CBG: Recent Labs  Lab 04/19/24 1134 04/19/24 1617 04/19/24 2129 04/20/24 0606 04/20/24 1139  GLUCAP 224* 270* 224* 238* 264*   D-Dimer: No results for input(s): "DDIMER" in the last 72 hours. Hgb A1c: Recent Labs    04/18/24 1938  HGBA1C 8.1*   Lipid Profile: No results for input(s): "CHOL", "HDL", "LDLCALC", "TRIG", "CHOLHDL", "LDLDIRECT" in the last 72 hours. Thyroid function studies: No results for input(s): "TSH", "T4TOTAL", "T3FREE", "THYROIDAB" in the last 72 hours.  Invalid input(s): "FREET3" Anemia work up: No results for input(s): "VITAMINB12", "FOLATE", "FERRITIN", "TIBC", "IRON", "RETICCTPCT" in the last 72 hours. Sepsis Labs: Recent Labs  Lab 04/17/24 1031 04/18/24 0923  WBC 7.4 6.5   Microbiology Recent Results (from the past 240 hours)  Culture, blood (Routine X 2) w Reflex to  ID Panel     Status: None   Collection Time: 04/12/24  6:10 PM   Specimen: BLOOD  Result Value Ref Range Status   Specimen Description BLOOD LEFT ANTECUBITAL  Final   Special Requests   Final    BOTTLES DRAWN AEROBIC AND ANAEROBIC Blood Culture results may not be optimal due to an inadequate volume of blood received in culture bottles   Culture   Final    NO GROWTH 5 DAYS Performed at [facility], [address] [place], [place] [address]    Report Status 04/17/2024 FINAL  Final  Culture, blood (Routine X 2) w Reflex to ID Panel     Status: Abnormal   Collection Time: 04/12/24  6:10 PM   Specimen: BLOOD  Result Value Ref Range Status   Specimen Description BLOOD RIGHT ANTECUBITAL  Final   Special Requests   Final    BOTTLES DRAWN AEROBIC AND ANAEROBIC Blood Culture results may not be optimal due to an inadequate volume of blood received in culture bottles   Culture  Setup Time   Final    GRAM POSITIVE COCCI ANAEROBIC BOTTLE ONLY CRITICAL RESULT CALLED TO, READ BACK BY AND VERIFIED WITH: PHARMD MICHAEL BITONTI [id number] AT 2030 BY EC    Culture (A)  Final    STAPHYLOCOCCUS CAPITIS THE SIGNIFICANCE OF ISOLATING THIS ORGANISM FROM A SINGLE SET OF BLOOD CULTURES WHEN MULTIPLE SETS ARE DRAWN IS UNCERTAIN. PLEASE NOTIFY THE MICROBIOLOGY DEPARTMENT WITHIN ONE WEEK IF SPECIATION AND SENSITIVITIES ARE REQUIRED. Performed at [facility], [address] [place], [place] [address]    Report Status 04/16/2024 FINAL  Final  Blood Culture ID Panel (Reflexed)     Status: Abnormal   Collection Time: 04/12/24  6:10 PM  Result Value Ref Range Status   Enterococcus faecalis NOT DETECTED NOT DETECTED Final   Enterococcus Faecium NOT DETECTED NOT DETECTED Final   Listeria monocytogenes NOT DETECTED NOT DETECTED Final   Staphylococcus species DETECTED (A) NOT DETECTED Final    Comment: CRITICAL RESULT CALLED TO, READ BACK BY AND VERIFIED WITH: PHARMD MICHAEL BITONTI [id number] AT 2030 BY  EC    Staphylococcus aureus (BCID) NOT DETECTED NOT DETECTED Final   Staphylococcus epidermidis NOT DETECTED NOT DETECTED Final   Staphylococcus lugdunensis NOT DETECTED NOT DETECTED Final   Streptococcus species NOT DETECTED NOT DETECTED Final   Streptococcus agalactiae NOT DETECTED NOT DETECTED Final   Streptococcus pneumoniae NOT DETECTED NOT DETECTED Final  Streptococcus pyogenes NOT DETECTED NOT DETECTED Final   A.calcoaceticus-baumannii NOT DETECTED NOT DETECTED Final   Bacteroides fragilis NOT DETECTED NOT DETECTED Final   Enterobacterales NOT DETECTED NOT DETECTED Final   Enterobacter cloacae complex NOT DETECTED NOT DETECTED Final   Escherichia coli NOT DETECTED NOT DETECTED Final   Klebsiella aerogenes NOT DETECTED NOT DETECTED Final   Klebsiella oxytoca NOT DETECTED NOT DETECTED Final   Klebsiella pneumoniae NOT DETECTED NOT DETECTED Final   Proteus species NOT DETECTED NOT DETECTED Final   Salmonella species NOT DETECTED NOT DETECTED Final   Serratia marcescens NOT DETECTED NOT DETECTED Final   Haemophilus influenzae NOT DETECTED NOT DETECTED Final   Neisseria meningitidis NOT DETECTED NOT DETECTED Final   Pseudomonas aeruginosa NOT DETECTED NOT DETECTED Final   Stenotrophomonas maltophilia NOT DETECTED NOT DETECTED Final   Candida albicans NOT DETECTED NOT DETECTED Final   Candida auris NOT DETECTED NOT DETECTED Final   Candida glabrata NOT DETECTED NOT DETECTED Final   Candida krusei NOT DETECTED NOT DETECTED Final   Candida parapsilosis NOT DETECTED NOT DETECTED Final   Candida tropicalis NOT DETECTED NOT DETECTED Final   Cryptococcus neoformans/gattii NOT DETECTED NOT DETECTED Final    Comment: Performed at [facility], [address] [place], [place] [address]     Medications:    allopurinol   300 mg Oral Daily   cholecalciferol   1,000 Units Oral Daily   cyanocobalamin   500 mcg Oral Daily   docusate sodium   100 mg Oral BID   gabapentin   300 mg Oral  TID   glipiZIDE  5 mg Oral BID AC   heparin  injection (subcutaneous)  5,000 Units Subcutaneous Q8H   insulin  aspart  0-5 Units Subcutaneous QHS   insulin  aspart  0-9 Units Subcutaneous TID WC   insulin  aspart  3 Units Subcutaneous TID WC   insulin  glargine-yfgn  25 Units Subcutaneous BID   levofloxacin  750 mg Oral Daily   linezolid  600 mg Oral Q12H   magnesium  oxide  400 mg Oral QHS   metFORMIN   1,000 mg Oral BID WC   metoprolol  tartrate  50 mg Oral BID   pantoprazole   40 mg Oral Daily   pravastatin   80 mg Oral q1800   Continuous Infusions:      LOS: 8 days   Macdonald Savoy  [facility]  04/20/2024, 11:59 AM

## 2024-04-20 NOTE — Progress Notes (Signed)
 RN educated and demonstrated onto patient wound care for R tibial wound. RN perform teach back about wound care cleanse and wrapping techniques. Patient verbalize teachback. RN wrapped kerlix gauze and ace compression wrap per patient request. Supplies at bedside for home use.

## 2024-04-20 NOTE — Inpatient Diabetes Management (Signed)
 Inpatient Diabetes Program Recommendations  AACE/ADA: New Consensus Statement on Inpatient Glycemic Control (2015)  Target Ranges:  Prepandial:   less than 140 mg/dL      Peak postprandial:   less than 180 mg/dL (1-2 hours)      Critically ill patients:  140 - 180 mg/dL   Lab Results  Component Value Date   GLUCAP 264 (H) 04/20/2024   HGBA1C 8.1 (H) 04/18/2024    Review of Glycemic Control  Diabetes history: DM2 Outpatient Diabetes medications: Metformin  500 mg BID Current orders for Inpatient glycemic control: Semglee 25 units BID, Novolog  0-9 units correction scale TID, Novolog  0-5 units HS scale, Novolog  3 units TID with meals, Metformin  1000 mg BID, Glucotrol 5 mg BID  Inpatient Diabetes Program Recommendations:   Spoke with patient at the bedside. Patient states that he is not going to take insulin  because people have so much trouble with cost, getting it, etc. He states that his hgbA1C had been 10% in the past, and we discussed his current A1C of 8.1%. He does have a meter at home, but may need to update it and the strips. Encouraged him to check his blood sugars at least twice per day.   He was currently eating part of a chicken sandwich that had been brought in to him. He stated that his appetite has not been good. He hopes to get to go home soon, but would like to get up and walk some with therapy. His significant other was in the room and was concerned about him going home and having 4-5  steps to climb when getting inside the house. Continues to have some cervical pain.   Encouraged him to see his PCP after discharge and check blood sugars at home.   Nick Barman RN BSN CDE Diabetes Coordinator Pager: [phone]  8am-5pm

## 2024-04-20 NOTE — Progress Notes (Signed)
 Overall doing reasonably well.  Much less leg pain.  Dorsiflexion and plantarflexion in his right foot normal.  Wound continues to look improved.  Much less erythema.  Minimal drainage.  No significant tenderness.  Patient with some moderate hyperkalemia of unknown etiology.  Patient being treated and plan for repeat labs in the morning.  If labs look good and patient feeling well he can go home tomorrow on oral antibiotics as recommended by the ID service.

## 2024-04-20 NOTE — Plan of Care (Signed)
  Problem: Clinical Measurements: Goal: Will remain free from infection Outcome: Progressing Goal: Diagnostic test results will improve Outcome: Progressing Goal: Cardiovascular complication will be avoided Outcome: Progressing   

## 2024-04-21 ENCOUNTER — Other Ambulatory Visit (HOSPITAL_COMMUNITY): Payer: Self-pay

## 2024-04-21 DIAGNOSIS — T8149XA Infection following a procedure, other surgical site, initial encounter: Secondary | ICD-10-CM | POA: Diagnosis not present

## 2024-04-21 DIAGNOSIS — E875 Hyperkalemia: Secondary | ICD-10-CM

## 2024-04-21 DIAGNOSIS — L03115 Cellulitis of right lower limb: Secondary | ICD-10-CM | POA: Diagnosis not present

## 2024-04-21 LAB — BASIC METABOLIC PANEL WITH GFR
Anion gap: 9 (ref 5–15)
BUN: 34 mg/dL — ABNORMAL HIGH (ref 8–23)
CO2: 22 mmol/L (ref 22–32)
Calcium: 8.4 mg/dL — ABNORMAL LOW (ref 8.9–10.3)
Chloride: 99 mmol/L (ref 98–111)
Creatinine, Ser: 1.72 mg/dL — ABNORMAL HIGH (ref 0.61–1.24)
GFR, Estimated: 42 mL/min — ABNORMAL LOW (ref >60)
Glucose, Bld: 145 mg/dL — ABNORMAL HIGH (ref 70–99)
Potassium: 5.5 mmol/L — ABNORMAL HIGH (ref 3.5–5.1)
Sodium: 130 mmol/L — ABNORMAL LOW (ref 135–145)

## 2024-04-21 LAB — GLUCOSE, CAPILLARY
Glucose-Capillary: 166 mg/dL — ABNORMAL HIGH (ref 70–99)
Glucose-Capillary: 257 mg/dL — ABNORMAL HIGH (ref 70–99)
Glucose-Capillary: 81 mg/dL (ref 70–99)
Glucose-Capillary: 148 mg/dL — ABNORMAL HIGH (ref 70–99)

## 2024-04-21 MED ORDER — SODIUM ZIRCONIUM CYCLOSILICATE 10 G PO PACK
10.0000 g | PACK | Freq: Two times a day (BID) | ORAL | Status: AC
  Administered 2024-04-21 (×2): 10 g via ORAL
  Filled 2024-04-21 (×2): qty 1

## 2024-04-21 MED ORDER — GLIPIZIDE 10 MG PO TABS
10.0000 mg | ORAL_TABLET | Freq: Two times a day (BID) | ORAL | Status: DC
  Administered 2024-04-21 – 2024-04-22 (×3): 10 mg via ORAL
  Filled 2024-04-21 (×4): qty 1

## 2024-04-21 MED ORDER — HYDROCODONE-ACETAMINOPHEN 5-325 MG PO TABS
1.0000 | ORAL_TABLET | ORAL | Status: DC | PRN
  Administered 2024-04-21: 1 via ORAL
  Administered 2024-04-22: 2 via ORAL
  Filled 2024-04-21 (×2): qty 2

## 2024-04-21 MED ORDER — POLYETHYLENE GLYCOL 3350 17 G PO PACK
17.0000 g | PACK | Freq: Two times a day (BID) | ORAL | Status: DC
  Administered 2024-04-21 – 2024-04-22 (×3): 17 g via ORAL
  Filled 2024-04-21 (×3): qty 1

## 2024-04-21 MED ORDER — SODIUM CHLORIDE 0.9 % IV SOLN: INTRAVENOUS | Status: DC

## 2024-04-21 MED ORDER — DIAZEPAM 5 MG PO TABS
5.0000 mg | ORAL_TABLET | Freq: Four times a day (QID) | ORAL | Status: DC | PRN
  Administered 2024-04-21 (×2): 5 mg via ORAL
  Filled 2024-04-21 (×2): qty 1

## 2024-04-21 NOTE — Progress Notes (Signed)
 Physical Therapy Treatment Patient Details Name: Tim Torres MRN: [id number] DOB: 1951/11/08 Today's Date: 04/21/2024   History of Present Illness Pt is 73 yo presenting to [facility] 5/29 for s/p surgical infection. Pt had R peroneal nerve decompression on 5/16. Did well for 4 days prior to noticing drainage from incision site. PMH: Arthritis, DM, HTN, MI, sleep apnea    PT Comments  The pt is continuing to report severe cervical pain, which was reproduced with palpation of his cervical musculature, primarily on his R. Noted tightness and more prominent muscle bulging on his R than on his L as well. Provided pt with soft tissue mobilization to his tolerance and applied heat to his cervical musculature at end of session to try to reduce his pain. Guided pt through a couple stretches as well. Provided pt with HEP handout on neck stretches and educated him to perform to pt tolerance, Access Code: [email or web address]. He was able to progress to ambulating up and down the hall and navigating stairs today, only requiring CGA-supervision for safety. He is benefiting from using a RW for balance at this time. Educated pt to try to reduce UE reliance with bed mobility and all functional mobility to reduce his cervical muscular tightness and pain. He verbalized understanding. Will continue to follow acutely.     If plan is discharge home, recommend the following: Assistance with cooking/housework;Assist for transportation;Help with stairs or ramp for entrance;A little help with bathing/dressing/bathroom   Can travel by private vehicle        Equipment Recommendations  None recommended by PT    Recommendations for Other Services       Precautions / Restrictions Precautions Precautions: Fall Recall of Precautions/Restrictions: Intact Restrictions Weight Bearing Restrictions Per Provider Order: No     Mobility  Bed Mobility Overal bed mobility: Needs Assistance Bed Mobility: Supine to Sit     Supine to  sit: Supervision, HOB elevated, Used rails     General bed mobility comments: HOB elevated and pt using rails to pull trunk up to sit L EOB, extra time due to pain, supervision for safety. Cued pt to try not to use UEs so much to reduce neck pain.    Transfers Overall transfer level: Needs assistance Equipment used: Rolling walker (2 wheels) Transfers: Sit to/from Stand Sit to Stand: Contact guard assist           General transfer comment: CGA for safety, no LOB    Ambulation/Gait Ambulation/Gait assistance: Contact guard assist, Supervision Gait Distance (Feet): 300 Feet Assistive device: Rolling walker (2 wheels) Gait Pattern/deviations: Step-through pattern, Decreased stride length, Trunk flexed Gait velocity: reduced Gait velocity interpretation: <1.31 ft/sec, indicative of household ambulator   General Gait Details: Pt ambulates with a step-through gait pattern and with a flexed posture, needing cues to relax his shoulders and look up to improve his posture and try to prevent further neck pain. No LOB, CGA progressing to supervision for safety   Stairs Stairs: Yes Stairs assistance: Contact guard assist Stair Management: Step to pattern, Forwards, Two rails Number of Stairs: 2 General stair comments: Ascends and descends stairs slowly with step-to pattern and bil rail support, leading up with his L leg and down with his R. No LOB, CGA for safety   Wheelchair Mobility     Tilt Bed    Modified Rankin (Stroke Patients Only)       Balance Overall balance assessment: Needs assistance Sitting-balance support: Feet supported, No upper extremity supported Sitting balance-Leahy Scale:  Fair     Standing balance support: Bilateral upper extremity supported, During functional activity, Reliant on assistive device for balance Standing balance-Leahy Scale: Poor Standing balance comment: reliant on UE support                            Communication  Communication Communication: No apparent difficulties  Cognition Arousal: Alert Behavior During Therapy: WFL for tasks assessed/performed   PT - Cognitive impairments: No apparent impairments                         Following commands: Intact      Cueing Cueing Techniques: Verbal cues  Exercises Other Exercises Other Exercises: pt performed R upper trap stretch in sitting to his tolerance Other Exercises: provided soft tissue mobilization to his neck (primarily R) musculature, 2x >/= 5 min each, 1x supine, 1x sitting; frequently checked in with pt to ensure pressure was adequate and tolerable with him continuously confirming and often requesting increased pressure; applied heat at end of session    General Comments General comments (skin integrity, edema, etc.): provided pt with HEP handout on neck stretches and educated him to perform to pt tolerance, Access Code: [email or web address]      Pertinent Vitals/Pain Pain Assessment Pain Assessment: Faces Faces Pain Scale: Hurts whole lot Pain Location: neck, primarily R side, reproduced with palpation of R cervical musculature Pain Descriptors / Indicators: Aching, Discomfort, Grimacing, Guarding, Moaning Pain Intervention(s): Monitored during session, Limited activity within patient's tolerance, Premedicated before session, Repositioned, Heat applied, Utilized relaxation techniques (soft tissue mobilization provided to pt tolerance)    Home Living                          Prior Function            PT Goals (current goals can now be found in the care plan section) Acute Rehab PT Goals Patient Stated Goal: to reduce neck pain PT Goal Formulation: With patient Time For Goal Achievement: 05/02/24 Potential to Achieve Goals: Good Progress towards PT goals: Progressing toward goals    Frequency    Min 2X/week      PT Plan      Co-evaluation              AM-PAC PT "6 Clicks" Mobility   Outcome Measure   Help needed turning from your back to your side while in a flat bed without using bedrails?: A Little Help needed moving from lying on your back to sitting on the side of a flat bed without using bedrails?: A Little Help needed moving to and from a bed to a chair (including a wheelchair)?: A Little Help needed standing up from a chair using your arms (e.g., wheelchair or bedside chair)?: A Little Help needed to walk in hospital room?: A Little Help needed climbing 3-5 steps with a railing? : A Little 6 Click Score: 18    End of Session   Activity Tolerance: Patient limited by pain;Patient tolerated treatment well Patient left: in chair;with call bell/phone within reach   PT Visit Diagnosis: Unsteadiness on feet (R26.81);Other abnormalities of gait and mobility (R26.89);Pain;Difficulty in walking, not elsewhere classified (R26.2) Pain - Right/Left: Right Pain - part of body:  (neck)     Time: 9604-5409 PT Time Calculation (min) (ACUTE ONLY): 39 min  Charges:    $Gait Training: 8-22 mins $Therapeutic Exercise: 8-22  mins $Therapeutic Activity: 8-22 mins PT General Charges $$ ACUTE PT VISIT: 1 Visit                     Vernida Goodie, PT, DPT Acute Rehabilitation Services  Office: [phone]    Ellyn Hack 04/21/2024, 11:47 AM

## 2024-04-21 NOTE — Progress Notes (Signed)
 [facility] CONSULT PROGRESS NOTE    Progress Note  Tim Torres  ZOX:[id number] DOB: 06-19-1951 DOA: 04/12/2024 PCP: Colene Dauphin, MD   Brief Narrative:   Tim Torres is an 73 y.o. male male with past medical history of essential hypertension, diabetes mellitus type 2 coronary artery disease and gout who had right peroneal nerve decompression 03/30/2024, went for follow-up appointment with his neurosurgeon 04/12/2024 was found to have a wound incision drainage with a temperature of 103.2 started on antibiotics admitted to the ED infectious disease was consulted. We were consulted for diabetes management.  Assessment/Plan:   Sepsis secondary to right lower extremity cellulitis /postoperative wound infection: Wound appears to be healing. Imaging show no osteomyelitis ultrasound showed no abscess. He has remained afebrile with no leukocytosis. Now transition to oral levofloxacin  and budesonide for 14 days further management per ID.    Elevated LFTs: Abdominal ultrasound showed no acute cholecystitis HIDA scan was unremarkable shows no biliary obstruction or acute cholecystitis. Question if this is multifactorial secondary to antibiotics (Rocephin ).  Hyperglycemic hyperosmolar nonketotic state/diabetes mellitus type 2:  With last A1c of 8.1. Patient is adamant about being on insulin  he has been very disrespectful implying the we do not know what you are doing. This morning I took the nurse and with me I try to explain to him the importance of his diabetes and his hyperkalemia. He was belligerent to me swearing and insulting. Home on glipizide  10 mg BID and metformin  1000 mg BID. PCP needs to have a long discussion with the patient as his GFR is just above 40 mL/min.  Acute kidney injury on chronic kidney disease stage IIIa/hyperkalemia: Likely hemodynamically mediated with a baseline creatinine around 1.3. Started on IV fluids his creatinine has returned to  baseline. He has refused Lokelma  despite me talking to him twice he was insulting. He relates it will get better when he gets home.  Worsening hyperkalemia: Start IV fluids, was started on Lokelma  which the patient refused.  I tried to explain to him about the importance of treating his potassium he refused to listen. He relates he is not going to take it. I explained to him the risk and benefits of not taking the Lokelma  and how it will kill him and he understands. Continue IV fluids recheck basic metabolic panel in the morning.  Pseudohyponatremia: Plus minus hypovolemic hyponatremia, now has normalized with fluid resuscitation.  Normocytic anemia: Hemoglobin appears to be at baseline.  Essential hypertension: Continue metoprolol .  DVT prophylaxis: lovenox  Family Communication:none Status is: Inpatient Remains inpatient appropriate because: Factious etiology    Code Status:     Code Status Orders  (From admission, onward)           Start     Ordered   04/12/24 1702  Full code  Continuous       Question:  By:  Answer:  Procedural case: previous code status reviewed   04/12/24 1712           Code Status History     Date Active Date Inactive Code Status Order ID Comments User Context   07/20/2022 1618 07/21/2022 1955 Full Code [id number]  Lore Rode, PA-C Inpatient   06/27/2020 1059 06/28/2020 1525 Full Code [id number]  Agustina Aldrich, MD Inpatient   09/23/2019 2250 09/24/2019 1354 Full Code [id number]  Elouise Haley, MD ED         IV Access:   Peripheral IV   Procedures and diagnostic studies:  No results found.    Medical Consultants:   None.   Subjective:    Tim Torres continues to be disrespectful belligerent swearing and insulting  Objective:    Vitals:   04/21/24 0035 04/21/24 0358 04/21/24 0729 04/21/24 1119  BP: (!) 151/79 139/69 (!) 162/81 139/81  Pulse: 77 74 79 81  Resp: 18 19 20 20   Temp: 98.3 F (36.8 C) 98.4 F  (36.9 C) 98.1 F (36.7 C) 98.4 F (36.9 C)  TempSrc: Oral Oral Oral Oral  SpO2: 98% 98% 98% 98%  Weight:      Height:       SpO2: 98 % O2 Flow Rate (L/min): 0 L/min FiO2 (%): 21 %   Intake/Output Summary (Last 24 hours) at 04/21/2024 1137 Last data filed at 04/21/2024 0900 Gross per 24 hour  Intake 1317.37 ml  Output 1000 ml  Net 317.37 ml   Filed Weights   04/12/24 1720 04/20/24 0637  Weight: 110.2 kg 110 kg    Exam: General exam: In no acute distress. Respiratory system: Good air movement and clear to auscultation. Cardiovascular system: S1 & S2 heard, RRR. No JVD. Gastrointestinal system: Abdomen is nondistended, soft and nontender.  Extremities: No pedal edema. Skin: No rashes, lesions or ulcers Psychiatry: Judgement and insight appear normal. Mood & affect appropriate.  Data Reviewed:    Labs: Basic Metabolic Panel: Recent Labs  Lab 04/16/24 1631 04/18/24 1610 04/18/24 1938 04/19/24 0556 04/20/24 0327 04/21/24 0350  NA 131* 129*  --  135 131* 130*  K 4.9 4.7  --  4.9 5.3* 5.5*  CL 94* 96*  --  100 98 99  CO2 26 24  --  28 24 22   GLUCOSE 386* 467*  --  157* 231* 145*  BUN 35* 33*  --  32* 28* 34*  CREATININE 1.54* 1.44*  --  1.41* 1.61* 1.72*  CALCIUM  9.2 9.4  --  9.3 8.9 8.4*  MG  --   --  2.0  --   --   --    GFR Estimated Creatinine Clearance: 50.5 mL/min (A) (by C-G formula based on SCr of 1.72 mg/dL (H)). Liver Function Tests: Recent Labs  Lab 04/16/24 1631 04/18/24 0923 04/19/24 0556  AST 188* 75* 45*  ALT 167* 128* 94*  ALKPHOS 164* 147* 145*  BILITOT 0.6 0.5 0.3  PROT 6.9 6.9 6.6  ALBUMIN 2.7* 2.3* 2.2*   No results for input(s): "LIPASE", "AMYLASE" in the last 168 hours. No results for input(s): "AMMONIA" in the last 168 hours. Coagulation profile No results for input(s): "INR", "PROTIME" in the last 168 hours. COVID-19 Labs  No results for input(s): "DDIMER", "FERRITIN", "LDH", "CRP" in the last 72 hours.  Lab Results   Component Value Date   SARSCOV2NAA NEGATIVE 06/24/2020   SARSCOV2NAA NEGATIVE 09/23/2019    CBC: Recent Labs  Lab 04/17/24 1031 04/18/24 0923  WBC 7.4 6.5  NEUTROABS 6.0 4.8  HGB 11.6* 11.2*  HCT 35.0* 33.2*  MCV 91.6 92.0  PLT 204 191   Cardiac Enzymes: Recent Labs  Lab 04/18/24 1938  CKTOTAL 29*   BNP (last 3 results) No results for input(s): "PROBNP" in the last 8760 hours. CBG: Recent Labs  Lab 04/20/24 1139 04/20/24 1610 04/20/24 2124 04/21/24 0606 04/21/24 1117  GLUCAP 264* 142* 101* 166* 257*   D-Dimer: No results for input(s): "DDIMER" in the last 72 hours. Hgb A1c: Recent Labs    04/18/24 1938  HGBA1C 8.1*   Lipid Profile: No  results for input(s): "CHOL", "HDL", "LDLCALC", "TRIG", "CHOLHDL", "LDLDIRECT" in the last 72 hours. Thyroid function studies: No results for input(s): "TSH", "T4TOTAL", "T3FREE", "THYROIDAB" in the last 72 hours.  Invalid input(s): "FREET3" Anemia work up: No results for input(s): "VITAMINB12", "FOLATE", "FERRITIN", "TIBC", "IRON", "RETICCTPCT" in the last 72 hours. Sepsis Labs: Recent Labs  Lab 04/17/24 1031 04/18/24 0923  WBC 7.4 6.5   Microbiology Recent Results (from the past 240 hours)  Culture, blood (Routine X 2) w Reflex to ID Panel     Status: None   Collection Time: 04/12/24  6:10 PM   Specimen: BLOOD  Result Value Ref Range Status   Specimen Description BLOOD LEFT ANTECUBITAL  Final   Special Requests   Final    BOTTLES DRAWN AEROBIC AND ANAEROBIC Blood Culture results may not be optimal due to an inadequate volume of blood received in culture bottles   Culture   Final    NO GROWTH 5 DAYS Performed at [facility], [address], [place], [place] [address]    Report Status 04/17/2024 FINAL  Final  Culture, blood (Routine X 2) w Reflex to ID Panel     Status: Abnormal   Collection Time: 04/12/24  6:10 PM   Specimen: BLOOD  Result Value Ref Range Status   Specimen Description BLOOD RIGHT  ANTECUBITAL  Final   Special Requests   Final    BOTTLES DRAWN AEROBIC AND ANAEROBIC Blood Culture results may not be optimal due to an inadequate volume of blood received in culture bottles   Culture  Setup Time   Final    GRAM POSITIVE COCCI ANAEROBIC BOTTLE ONLY CRITICAL RESULT CALLED TO, READ BACK BY AND VERIFIED WITH: PHARMD MICHAEL BITONTI [id number] AT 2030 BY EC    Culture (A)  Final    STAPHYLOCOCCUS CAPITIS THE SIGNIFICANCE OF ISOLATING THIS ORGANISM FROM A SINGLE SET OF BLOOD CULTURES WHEN MULTIPLE SETS ARE DRAWN IS UNCERTAIN. PLEASE NOTIFY THE MICROBIOLOGY DEPARTMENT WITHIN ONE WEEK IF SPECIATION AND SENSITIVITIES ARE REQUIRED. Performed at [facility], [address] [place], [place] [address]    Report Status 04/16/2024 FINAL  Final  Blood Culture ID Panel (Reflexed)     Status: Abnormal   Collection Time: 04/12/24  6:10 PM  Result Value Ref Range Status   Enterococcus faecalis NOT DETECTED NOT DETECTED Final   Enterococcus Faecium NOT DETECTED NOT DETECTED Final   Listeria monocytogenes NOT DETECTED NOT DETECTED Final   Staphylococcus species DETECTED (A) NOT DETECTED Final    Comment: CRITICAL RESULT CALLED TO, READ BACK BY AND VERIFIED WITH: PHARMD MICHAEL BITONTI [id number] AT 2030 BY EC    Staphylococcus aureus (BCID) NOT DETECTED NOT DETECTED Final   Staphylococcus epidermidis NOT DETECTED NOT DETECTED Final   Staphylococcus lugdunensis NOT DETECTED NOT DETECTED Final   Streptococcus species NOT DETECTED NOT DETECTED Final   Streptococcus agalactiae NOT DETECTED NOT DETECTED Final   Streptococcus pneumoniae NOT DETECTED NOT DETECTED Final   Streptococcus pyogenes NOT DETECTED NOT DETECTED Final   A.calcoaceticus-baumannii NOT DETECTED NOT DETECTED Final   Bacteroides fragilis NOT DETECTED NOT DETECTED Final   Enterobacterales NOT DETECTED NOT DETECTED Final   Enterobacter cloacae complex NOT DETECTED NOT DETECTED Final   Escherichia coli NOT DETECTED NOT  DETECTED Final   Klebsiella aerogenes NOT DETECTED NOT DETECTED Final   Klebsiella oxytoca NOT DETECTED NOT DETECTED Final   Klebsiella pneumoniae NOT DETECTED NOT DETECTED Final   Proteus species NOT DETECTED NOT DETECTED Final  Salmonella species NOT DETECTED NOT DETECTED Final   Serratia marcescens NOT DETECTED NOT DETECTED Final   Haemophilus influenzae NOT DETECTED NOT DETECTED Final   Neisseria meningitidis NOT DETECTED NOT DETECTED Final   Pseudomonas aeruginosa NOT DETECTED NOT DETECTED Final   Stenotrophomonas maltophilia NOT DETECTED NOT DETECTED Final   Candida albicans NOT DETECTED NOT DETECTED Final   Candida auris NOT DETECTED NOT DETECTED Final   Candida glabrata NOT DETECTED NOT DETECTED Final   Candida krusei NOT DETECTED NOT DETECTED Final   Candida parapsilosis NOT DETECTED NOT DETECTED Final   Candida tropicalis NOT DETECTED NOT DETECTED Final   Cryptococcus neoformans/gattii NOT DETECTED NOT DETECTED Final    Comment: Performed at [facility], [address] [place], [place] [address]     Medications:    allopurinol   300 mg Oral Daily   cholecalciferol   1,000 Units Oral Daily   cyanocobalamin   500 mcg Oral Daily   docusate sodium   100 mg Oral BID   gabapentin   300 mg Oral TID   glipiZIDE   10 mg Oral BID AC   heparin  injection (subcutaneous)  5,000 Units Subcutaneous Q8H   insulin  aspart  0-5 Units Subcutaneous QHS   insulin  aspart  0-9 Units Subcutaneous TID WC   insulin  aspart  3 Units Subcutaneous TID WC   insulin  glargine-yfgn  25 Units Subcutaneous BID   levofloxacin   750 mg Oral Daily   linezolid   600 mg Oral Q12H   magnesium  oxide  400 mg Oral QHS   metFORMIN   1,000 mg Oral BID WC   metoprolol  tartrate  50 mg Oral BID   pantoprazole   40 mg Oral Daily   pravastatin   80 mg Oral q1800   Continuous Infusions:  sodium chloride  Stopped (04/21/24 1137)       LOS: 9 days   Macdonald Savoy  [facility]  04/21/2024, 11:37 AM

## 2024-04-21 NOTE — Progress Notes (Signed)
 Pt continues to complain of severe neck pain, states "it feels like it is fused and it is getting worse". Medicated with hydrocodone , flexeril  and ultimately dilaudid  IV x1, with partial effects noted. Pt reports providers are aware, however pt reports pain is worsening. Secure chat sent to Dr. Versa Gore and Dr. Adonis Alamin for update on pt condition. Oncoming nurse added to chat as well.

## 2024-04-21 NOTE — Progress Notes (Signed)
   Providing Compassionate, Quality Care - Together   Subjective: Patient reports severe neck pain with radiation into his right trapezius muscle. Cyclobenzaprine  not helping. He states his leg "feels fine."  Objective: Vital signs in last 24 hours: Temp:  [98 F (36.7 C)-98.4 F (36.9 C)] 98.1 F (36.7 C) (06/07 0729) Pulse Rate:  [67-87] 79 (06/07 0729) Resp:  [16-20] 20 (06/07 0729) BP: (127-162)/(69-81) 162/81 (06/07 0729) SpO2:  [95 %-99 %] 98 % (06/07 0729)  Intake/Output from previous day: 06/06 0701 - 06/07 0700 In: 1097.4 [P.O.:620; I.V.:477.4] Out: 1400 [Urine:1400] Intake/Output this shift: No intake/output data recorded.  Alert and oriented PERRLA Speech clear, no exudate noted at oropharanyx MAE, guarding RLE due to pain Surgical wound covered with ACE wrap and nonadherent dressing. Dressing with small amount of serous drainage.  Lab Results: Recent Labs    04/18/24 0923  WBC 6.5  HGB 11.2*  HCT 33.2*  PLT 191   BMET Recent Labs    04/20/24 0327 04/21/24 0350  NA 131* 130*  K 5.3* 5.5*  CL 98 99  CO2 24 22  GLUCOSE 231* 145*  BUN 28* 34*  CREATININE 1.61* 1.72*  CALCIUM  8.9 8.4*    Studies/Results: No results found.  Assessment/Plan: Patient is s/p peroneal nerve decompression with postoperative wound infection. New neck pain over the last two days.   LOS: 9 days   -Diazepam  ordered for muscle spasms. If effective, patient could discharge home later today. -Home abx ordered per ID. -Continue q shift wound care per WOC nurse's recommendations  I am in communication with my attending and they agree with the plan for this patient.   Henreitta Locus, DNP, AGNP-C Nurse Practitioner  [facility] [address], [place], [place] [address] P: [phone]    F: [phone]  04/21/2024, 9:16 AM

## 2024-04-22 DIAGNOSIS — E875 Hyperkalemia: Secondary | ICD-10-CM | POA: Diagnosis not present

## 2024-04-22 DIAGNOSIS — T8149XA Infection following a procedure, other surgical site, initial encounter: Secondary | ICD-10-CM | POA: Diagnosis not present

## 2024-04-22 DIAGNOSIS — L03115 Cellulitis of right lower limb: Secondary | ICD-10-CM | POA: Diagnosis not present

## 2024-04-22 LAB — GLUCOSE, CAPILLARY
Glucose-Capillary: 117 mg/dL — ABNORMAL HIGH (ref 70–99)
Glucose-Capillary: 93 mg/dL (ref 70–99)
Glucose-Capillary: 71 mg/dL (ref 70–99)

## 2024-04-22 LAB — BASIC METABOLIC PANEL WITH GFR
Anion gap: 9 (ref 5–15)
Anion gap: 7 (ref 5–15)
BUN: 39 mg/dL — ABNORMAL HIGH (ref 8–23)
BUN: 44 mg/dL — ABNORMAL HIGH (ref 8–23)
CO2: 22 mmol/L (ref 22–32)
CO2: 23 mmol/L (ref 22–32)
Calcium: 8.9 mg/dL (ref 8.9–10.3)
Calcium: 8.9 mg/dL (ref 8.9–10.3)
Chloride: 104 mmol/L (ref 98–111)
Chloride: 103 mmol/L (ref 98–111)
Creatinine, Ser: 1.89 mg/dL — ABNORMAL HIGH (ref 0.61–1.24)
Creatinine, Ser: 2.22 mg/dL — ABNORMAL HIGH (ref 0.61–1.24)
GFR, Estimated: 37 mL/min — ABNORMAL LOW (ref >60)
GFR, Estimated: 31 mL/min — ABNORMAL LOW (ref >60)
Glucose, Bld: 66 mg/dL — ABNORMAL LOW (ref 70–99)
Glucose, Bld: 68 mg/dL — ABNORMAL LOW (ref 70–99)
Potassium: 4.9 mmol/L (ref 3.5–5.1)
Potassium: 6.2 mmol/L — ABNORMAL HIGH (ref 3.5–5.1)
Sodium: 135 mmol/L (ref 135–145)
Sodium: 133 mmol/L — ABNORMAL LOW (ref 135–145)

## 2024-04-22 MED ORDER — DIAZEPAM 5 MG PO TABS: 5.0000 mg | ORAL_TABLET | Freq: Two times a day (BID) | ORAL | 0 refills | Status: DC | PRN

## 2024-04-22 MED ORDER — GLIPIZIDE 10 MG PO TABS: 10.0000 mg | ORAL_TABLET | Freq: Two times a day (BID) | ORAL | 0 refills | Status: DC

## 2024-04-22 MED ORDER — POLYETHYLENE GLYCOL 3350 17 G PO PACK: 17.0000 g | PACK | Freq: Two times a day (BID) | ORAL | 0 refills | Status: DC

## 2024-04-22 MED ORDER — METFORMIN HCL 1000 MG PO TABS: 1000.0000 mg | ORAL_TABLET | Freq: Two times a day (BID) | ORAL | 0 refills | Status: DC

## 2024-04-22 MED ORDER — SODIUM ZIRCONIUM CYCLOSILICATE 10 G PO PACK
10.0000 g | PACK | Freq: Every day | ORAL | Status: AC
  Administered 2024-04-22: 10 g via ORAL
  Filled 2024-04-22: qty 1

## 2024-04-22 MED ORDER — SODIUM CHLORIDE 0.9 % IV SOLN: INTRAVENOUS | Status: DC

## 2024-04-22 MED ORDER — OXYCODONE-ACETAMINOPHEN 5-325 MG PO TABS: 1.0000 | ORAL_TABLET | ORAL | 0 refills | Status: DC | PRN

## 2024-04-22 MED ORDER — LEVOFLOXACIN 750 MG PO TABS: 750.0000 mg | ORAL_TABLET | ORAL | Status: DC

## 2024-04-22 NOTE — Progress Notes (Signed)
 [facility] CONSULT PROGRESS NOTE    Progress Note  Tim Torres  NWG:[id number] DOB: July 02, 1951 DOA: 04/12/2024 PCP: Colene Dauphin, MD   Brief Narrative:   Tim Torres is an 73 y.o. male male with past medical history of essential hypertension, diabetes mellitus type 2 coronary artery disease and gout who had right peroneal nerve decompression 03/30/2024, went for follow-up appointment with his neurosurgeon 04/12/2024 was found to have a wound incision drainage with a temperature of 103.2 started on antibiotics admitted to the ED infectious disease was consulted. We were consulted for diabetes management.  Assessment/Plan:   Sepsis secondary to right lower extremity cellulitis /postoperative wound infection: Wound appears to be healing. Imaging show no osteomyelitis ultrasound showed no abscess. He has remained afebrile with no leukocytosis. Now transition to oral levofloxacin  and budesonide for 14 days further management per ID.    Elevated LFTs: Abdominal ultrasound showed no acute cholecystitis HIDA scan was unremarkable shows no biliary obstruction or acute cholecystitis. Question if this is multifactorial secondary to antibiotics (Rocephin ).  Hyperglycemic hyperosmolar nonketotic state/diabetes mellitus type 2:  With last A1c of 8.1. Patient is adamant about being on insulin  he has been very disrespectful implying the we do not know what you are doing. This morning I took the nurse and with me I try to explain to him the importance of his diabetes and his hyperkalemia. Home on glipizide  10 mg BID and metformin  1000 mg BID. PCP needs to have a long discussion with the patient as his GFR is just above 40 mL/min.  Acute kidney injury on chronic kidney disease stage IIIa: Likely hemodynamically mediated with a baseline creatinine around 1.3-1.6. Creatinine slightly up, likely hemodynamic mediated continue IV fluids and additional 24 hours recheck basic metabolic  panel tomorrow morning.  Worsening hyperkalemia: Start IV fluids, was started on Lokelma  initially refused. After fighting and refusing Lokelma  for several days he finally took it yesterday. His potassium this morning is 4.9. Continue IV fluids recheck basic metabolic panel in the morning.  Pseudohyponatremia: Plus minus hypovolemic hyponatremia, now has normalized with fluid resuscitation.  Normocytic anemia: Hemoglobin appears to be at baseline.  Essential hypertension: Continue metoprolol .  DVT prophylaxis: lovenox  Family Communication:none Status is: Inpatient Remains inpatient appropriate because: Factious etiology    Code Status:     Code Status Orders  (From admission, onward)           Start     Ordered   04/12/24 1702  Full code  Continuous       Question:  By:  Answer:  Procedural case: previous code status reviewed   04/12/24 1712           Code Status History     Date Active Date Inactive Code Status Order ID Comments User Context   07/20/2022 1618 07/21/2022 1955 Full Code [id number]  Lore Rode, PA-C Inpatient   06/27/2020 1059 06/28/2020 1525 Full Code [id number]  Agustina Aldrich, MD Inpatient   09/23/2019 2250 09/24/2019 1354 Full Code [id number]  Elouise Haley, MD ED         IV Access:   Peripheral IV   Procedures and diagnostic studies:   No results found.    Medical Consultants:   None.   Subjective:    Tim Torres in a better attitude today pleasant.  Objective:    Vitals:   04/21/24 2000 04/22/24 0000 04/22/24 0400 04/22/24 0758  BP: (!) 153/72 132/66 (!) 140/77 (!) 146/71  Pulse: 80  75 74 76  Resp: 20 16 17 16   Temp: 98.7 F (37.1 C) 98.1 F (36.7 C) 97.7 F (36.5 C) 97.8 F (36.6 C)  TempSrc: Oral Oral Oral Oral  SpO2: 99% 96% 96% 97%  Weight:      Height:       SpO2: 97 % O2 Flow Rate (L/min): 0 L/min FiO2 (%): 21 %   Intake/Output Summary (Last 24 hours) at 04/22/2024 0958 Last data filed at  04/22/2024 0100 Gross per 24 hour  Intake 223 ml  Output 500 ml  Net -277 ml   Filed Weights   04/12/24 1720 04/20/24 0637  Weight: 110.2 kg 110 kg    Exam: General exam: In no acute distress. Respiratory system: Good air movement and clear to auscultation. Cardiovascular system: S1 & S2 heard, RRR. No JVD. Gastrointestinal system: Abdomen is nondistended, soft and nontender.  Extremities: No pedal edema. Skin: No rashes, lesions or ulcers Psychiatry: Judgement and insight appear normal. Mood & affect appropriate. Data Reviewed:    Labs: Basic Metabolic Panel: Recent Labs  Lab 04/18/24 0923 04/18/24 1938 04/19/24 0556 04/20/24 0327 04/21/24 0350 04/22/24 0732  NA 129*  --  135 131* 130* 135  K 4.7  --  4.9 5.3* 5.5* 4.9  CL 96*  --  100 98 99 104  CO2 24  --  28 24 22 22   GLUCOSE 467*  --  157* 231* 145* 66*  BUN 33*  --  32* 28* 34* 39*  CREATININE 1.44*  --  1.41* 1.61* 1.72* 1.89*  CALCIUM  9.4  --  9.3 8.9 8.4* 8.9  MG  --  2.0  --   --   --   --    GFR Estimated Creatinine Clearance: 45.9 mL/min (A) (by C-G formula based on SCr of 1.89 mg/dL (H)). Liver Function Tests: Recent Labs  Lab 04/16/24 1631 04/18/24 0923 04/19/24 0556  AST 188* 75* 45*  ALT 167* 128* 94*  ALKPHOS 164* 147* 145*  BILITOT 0.6 0.5 0.3  PROT 6.9 6.9 6.6  ALBUMIN 2.7* 2.3* 2.2*   No results for input(s): "LIPASE", "AMYLASE" in the last 168 hours. No results for input(s): "AMMONIA" in the last 168 hours. Coagulation profile No results for input(s): "INR", "PROTIME" in the last 168 hours. COVID-19 Labs  No results for input(s): "DDIMER", "FERRITIN", "LDH", "CRP" in the last 72 hours.  Lab Results  Component Value Date   SARSCOV2NAA NEGATIVE 06/24/2020   SARSCOV2NAA NEGATIVE 09/23/2019    CBC: Recent Labs  Lab 04/17/24 1031 04/18/24 0923  WBC 7.4 6.5  NEUTROABS 6.0 4.8  HGB 11.6* 11.2*  HCT 35.0* 33.2*  MCV 91.6 92.0  PLT 204 191   Cardiac Enzymes: Recent Labs   Lab 04/18/24 1938  CKTOTAL 29*   BNP (last 3 results) No results for input(s): "PROBNP" in the last 8760 hours. CBG: Recent Labs  Lab 04/21/24 1117 04/21/24 1610 04/21/24 2109 04/22/24 0412 04/22/24 0629  GLUCAP 257* 81 148* 117* 93   D-Dimer: No results for input(s): "DDIMER" in the last 72 hours. Hgb A1c: No results for input(s): "HGBA1C" in the last 72 hours.  Lipid Profile: No results for input(s): "CHOL", "HDL", "LDLCALC", "TRIG", "CHOLHDL", "LDLDIRECT" in the last 72 hours. Thyroid function studies: No results for input(s): "TSH", "T4TOTAL", "T3FREE", "THYROIDAB" in the last 72 hours.  Invalid input(s): "FREET3" Anemia work up: No results for input(s): "VITAMINB12", "FOLATE", "FERRITIN", "TIBC", "IRON", "RETICCTPCT" in the last 72 hours. Sepsis Labs: Recent Labs  Lab 04/17/24 1031 04/18/24 0923  WBC 7.4 6.5   Microbiology Recent Results (from the past 240 hours)  Culture, blood (Routine X 2) w Reflex to ID Panel     Status: None   Collection Time: 04/12/24  6:10 PM   Specimen: BLOOD  Result Value Ref Range Status   Specimen Description BLOOD LEFT ANTECUBITAL  Final   Special Requests   Final    BOTTLES DRAWN AEROBIC AND ANAEROBIC Blood Culture results may not be optimal due to an inadequate volume of blood received in culture bottles   Culture   Final    NO GROWTH 5 DAYS Performed at [facility], [address], [place], [place] [address]    Report Status 04/17/2024 FINAL  Final  Culture, blood (Routine X 2) w Reflex to ID Panel     Status: Abnormal   Collection Time: 04/12/24  6:10 PM   Specimen: BLOOD  Result Value Ref Range Status   Specimen Description BLOOD RIGHT ANTECUBITAL  Final   Special Requests   Final    BOTTLES DRAWN AEROBIC AND ANAEROBIC Blood Culture results may not be optimal due to an inadequate volume of blood received in culture bottles   Culture  Setup Time   Final    GRAM POSITIVE COCCI ANAEROBIC BOTTLE ONLY CRITICAL  RESULT CALLED TO, READ BACK BY AND VERIFIED WITH: PHARMD MICHAEL BITONTI [id number] AT 2030 BY EC    Culture (A)  Final    STAPHYLOCOCCUS CAPITIS THE SIGNIFICANCE OF ISOLATING THIS ORGANISM FROM A SINGLE SET OF BLOOD CULTURES WHEN MULTIPLE SETS ARE DRAWN IS UNCERTAIN. PLEASE NOTIFY THE MICROBIOLOGY DEPARTMENT WITHIN ONE WEEK IF SPECIATION AND SENSITIVITIES ARE REQUIRED. Performed at [facility], [address] [place], [place] [address]    Report Status 04/16/2024 FINAL  Final  Blood Culture ID Panel (Reflexed)     Status: Abnormal   Collection Time: 04/12/24  6:10 PM  Result Value Ref Range Status   Enterococcus faecalis NOT DETECTED NOT DETECTED Final   Enterococcus Faecium NOT DETECTED NOT DETECTED Final   Listeria monocytogenes NOT DETECTED NOT DETECTED Final   Staphylococcus species DETECTED (A) NOT DETECTED Final    Comment: CRITICAL RESULT CALLED TO, READ BACK BY AND VERIFIED WITH: PHARMD MICHAEL BITONTI [id number] AT 2030 BY EC    Staphylococcus aureus (BCID) NOT DETECTED NOT DETECTED Final   Staphylococcus epidermidis NOT DETECTED NOT DETECTED Final   Staphylococcus lugdunensis NOT DETECTED NOT DETECTED Final   Streptococcus species NOT DETECTED NOT DETECTED Final   Streptococcus agalactiae NOT DETECTED NOT DETECTED Final   Streptococcus pneumoniae NOT DETECTED NOT DETECTED Final   Streptococcus pyogenes NOT DETECTED NOT DETECTED Final   A.calcoaceticus-baumannii NOT DETECTED NOT DETECTED Final   Bacteroides fragilis NOT DETECTED NOT DETECTED Final   Enterobacterales NOT DETECTED NOT DETECTED Final   Enterobacter cloacae complex NOT DETECTED NOT DETECTED Final   Escherichia coli NOT DETECTED NOT DETECTED Final   Klebsiella aerogenes NOT DETECTED NOT DETECTED Final   Klebsiella oxytoca NOT DETECTED NOT DETECTED Final   Klebsiella pneumoniae NOT DETECTED NOT DETECTED Final   Proteus species NOT DETECTED NOT DETECTED Final   Salmonella species NOT DETECTED NOT DETECTED  Final   Serratia marcescens NOT DETECTED NOT DETECTED Final   Haemophilus influenzae NOT DETECTED NOT DETECTED Final   Neisseria meningitidis NOT DETECTED NOT DETECTED Final   Pseudomonas aeruginosa NOT DETECTED NOT DETECTED Final   Stenotrophomonas maltophilia NOT DETECTED NOT DETECTED Final   Candida  albicans NOT DETECTED NOT DETECTED Final   Candida auris NOT DETECTED NOT DETECTED Final   Candida glabrata NOT DETECTED NOT DETECTED Final   Candida krusei NOT DETECTED NOT DETECTED Final   Candida parapsilosis NOT DETECTED NOT DETECTED Final   Candida tropicalis NOT DETECTED NOT DETECTED Final   Cryptococcus neoformans/gattii NOT DETECTED NOT DETECTED Final    Comment: Performed at [facility], [address] [place], [place] [address]     Medications:    allopurinol   300 mg Oral Daily   cholecalciferol   1,000 Units Oral Daily   cyanocobalamin   500 mcg Oral Daily   docusate sodium   100 mg Oral BID   gabapentin   300 mg Oral TID   glipiZIDE   10 mg Oral BID AC   heparin  injection (subcutaneous)  5,000 Units Subcutaneous Q8H   insulin  aspart  0-5 Units Subcutaneous QHS   insulin  aspart  0-9 Units Subcutaneous TID WC   insulin  aspart  3 Units Subcutaneous TID WC   insulin  glargine-yfgn  25 Units Subcutaneous BID   levofloxacin   750 mg Oral Daily   linezolid   600 mg Oral Q12H   magnesium  oxide  400 mg Oral QHS   metFORMIN   1,000 mg Oral BID WC   metoprolol  tartrate  50 mg Oral BID   pantoprazole   40 mg Oral Daily   polyethylene glycol  17 g Oral BID   pravastatin   80 mg Oral q1800   Continuous Infusions:  sodium chloride          LOS: 10 days   Macdonald Savoy  [facility]  04/22/2024, 9:58 AM

## 2024-04-22 NOTE — Discharge Instructions (Addendum)
 Cleanse right leg wound daily with soap and water. Place a nonadherent dressing over the wound.  Complete the full course of antibiotics.

## 2024-04-22 NOTE — Progress Notes (Signed)
   Providing Compassionate, Quality Care - Together   Subjective: Patient reports his neck feels better today. He would like to discharge home later today.  Objective: Vital signs in last 24 hours: Temp:  [97.7 F (36.5 C)-98.7 F (37.1 C)] 97.8 F (36.6 C) (06/08 0758) Pulse Rate:  [66-81] 76 (06/08 0758) Resp:  [16-20] 16 (06/08 0758) BP: (130-153)/(66-81) 146/71 (06/08 0758) SpO2:  [96 %-100 %] 97 % (06/08 0758)  Intake/Output from previous day: 06/07 0701 - 06/08 0700 In: 543 [P.O.:320; I.V.:223] Out: 500 [Urine:500] Intake/Output this shift: No intake/output data recorded.  Alert and oriented PERRLA Speech clear MAE, up to chair during assessment. Surgical wound covered with ACE wrap and nonadherent dressing. Dressing with small amount of serous drainage.  Lab Results: No results for input(s): "WBC", "HGB", "HCT", "PLT" in the last 72 hours. BMET Recent Labs    04/21/24 0350 04/22/24 0732  NA 130* 135  K 5.5* 4.9  CL 99 104  CO2 22 22  GLUCOSE 145* 66*  BUN 34* 39*  CREATININE 1.72* 1.89*  CALCIUM  8.4* 8.9    Studies/Results: No results found.  Assessment/Plan: Patient is s/p peroneal nerve decompression with postoperative wound infection. Developed worsening neck pain while hospitalized that has responded well to diazepam . The patient's potassium became elevated during this admission. It is now at 4.9, following administration of Lokelma . Creatinine slightly elevated today. Dr. Bonita Bussing has ordered IV fluids. If the patient's creatinine and BUN improve, patient can discharge home later today.   LOS: 10 days   -Recheck BMP this afternoon. Discharge if improved renal function.  I am in communication with my attending and they agree with the plan for this patient.   Henreitta Locus, DNP, AGNP-C Nurse Practitioner  [facility] [address], [place], [place] [address] P: [phone]    F:  [phone]  04/22/2024, 10:25 AM

## 2024-04-22 NOTE — Discharge Summary (Signed)
 Physician Discharge Summary     Providing Compassionate, Quality Care - Together   Patient ID: Tim Torres MRN: [id number] DOB/AGE: [birth year] 73 y.o.  Admit date: 04/12/2024 Discharge date: 04/22/2024  Admission Diagnoses: Postoperative wound infection  Discharge Diagnoses:  Principal Problem:   Postoperative wound infection Active Problems:   HTN (hypertension)   HLD (hyperlipidemia)   DM2 (diabetes mellitus, type 2) (HCC)   CKD (chronic kidney disease) stage 3, GFR 30-59 ml/min (HCC)   Cellulitis of right lower extremity   Sepsis [facility])   Discharged Condition: fair  Hospital Course: [name] underwent a right peroneal nerve decompression by Dr. Adonis Alamin on 03/30/2024.  He did well for the 1st 4 days following surgery.  He noticed increased pain and drainage from his wound starting about 5 days after surgery.  The pain, drainage, and swelling continued to increase and Tim Torres was admitted to [facility] on 04/12/2024 for IV antibiotic therapy. His hospital course was complicated by elevated LFTs, decreased renal function, hyperkalemia, and pain control. These issues have since been addressed. The patient has transitioned to oral levofloxacin  and linezolid  and will remain on these medications for 14 days. Tim Torres is ready for discharge home. He will follow up with Dr. Adonis Alamin as an outpatient.   Consults: ID, WOC, PT/TOC  Significant Diagnostic Studies: labs:  Results for orders placed or performed during the hospital encounter of 04/12/24 (from the past 24 hours)  Glucose, capillary     Status: None   Collection Time: 04/21/24  4:10 PM  Result Value Ref Range   Glucose-Capillary 81 70 - 99 mg/dL   Comment 1 Notify RN    Comment 2 Document in Chart   Glucose, capillary     Status: Abnormal   Collection Time: 04/21/24  9:09 PM  Result Value Ref Range   Glucose-Capillary 148 (H) 70 - 99 mg/dL   Comment 1 Notify RN    Comment 2 Document in Chart   Glucose,  capillary     Status: Abnormal   Collection Time: 04/22/24  4:12 AM  Result Value Ref Range   Glucose-Capillary 117 (H) 70 - 99 mg/dL  Glucose, capillary     Status: None   Collection Time: 04/22/24  6:29 AM  Result Value Ref Range   Glucose-Capillary 93 70 - 99 mg/dL   Comment 1 Notify RN    Comment 2 Document in Chart   Basic metabolic panel with GFR     Status: Abnormal   Collection Time: 04/22/24  7:32 AM  Result Value Ref Range   Sodium 135 135 - 145 mmol/L   Potassium 4.9 3.5 - 5.1 mmol/L   Chloride 104 98 - 111 mmol/L   CO2 22 22 - 32 mmol/L   Glucose, Bld 66 (L) 70 - 99 mg/dL   BUN 39 (H) 8 - 23 mg/dL   Creatinine, Ser 4.09 (H) 0.61 - 1.24 mg/dL   Calcium  8.9 8.9 - 10.3 mg/dL   GFR, Estimated 37 (L) >60 mL/min   Anion gap 9 5 - 15  Glucose, capillary     Status: None   Collection Time: 04/22/24 11:36 AM  Result Value Ref Range   Glucose-Capillary 71 70 - 99 mg/dL     Treatments: antibiotics: Levaquin , ceftriaxone , and linezolid   Discharge Exam: Blood pressure 102/71, pulse 81, temperature 97.9 F (36.6 C), temperature source Oral, resp. rate 19, height 6' (1.829 m), weight 110 kg, SpO2 94%.  Alert and oriented PERRLA Speech clear MAE  Surgical wound covered with ACE wrap and nonadherent dressing. Dressing with small amount of serous drainage.  Disposition:    Allergies as of 04/22/2024       Reactions   Shellfish Allergy Shortness Of Breath, Swelling   Atorvastatin Other (See Comments)   Myalgia    Rosuvastatin Other (See Comments)   myalgia        Medication List     STOP taking these medications    HYDROcodone -acetaminophen  5-325 MG tablet Commonly known as: NORCO/VICODIN   metFORMIN  500 MG 24 hr tablet Commonly known as: GLUCOPHAGE -XR Replaced by: metFORMIN  1000 MG tablet       TAKE these medications    allopurinol  300 MG tablet Commonly known as: ZYLOPRIM  Take 1 tablet (300 mg total) by mouth daily.   aspirin  325 MG tablet Take  325 mg by mouth daily.   diazepam  5 MG tablet Commonly known as: VALIUM  Take 1 tablet (5 mg total) by mouth every 12 (twelve) hours as needed for muscle spasms.   gabapentin  300 MG capsule Commonly known as: NEURONTIN  Take 300 mg by mouth 3 (three) times daily.   glipiZIDE  10 MG tablet Commonly known as: GLUCOTROL  Take 1 tablet (10 mg total) by mouth 2 (two) times daily before a meal.   levofloxacin  750 MG tablet Commonly known as: Levaquin  Take 1 tablet (750 mg total) by mouth daily for 11 days.   linezolid  600 MG tablet Commonly known as: ZYVOX  Take 1 tablet (600 mg total) by mouth 2 (two) times daily for 11 days.   lovastatin 40 MG tablet Commonly known as: MEVACOR TAKE TWO TABLETS NIGHTLY   MAGNESIUM  PO Take by mouth.   meclizine  25 MG tablet Commonly known as: ANTIVERT  Take 1 tablet (25 mg total) by mouth 3 (three) times daily as needed for dizziness or nausea.   metFORMIN  1000 MG tablet Commonly known as: GLUCOPHAGE  Take 1 tablet (1,000 mg total) by mouth 2 (two) times daily with a meal. Replaces: metFORMIN  500 MG 24 hr tablet   metoprolol  tartrate 50 MG tablet Commonly known as: LOPRESSOR  TAKE ONE TABLET TWICE DAILY   oxyCODONE -acetaminophen  5-325 MG tablet Commonly known as: PERCOCET/ROXICET Take 1-2 tablets by mouth every 4 (four) hours as needed for moderate pain (pain score 4-6).   polyethylene glycol 17 g packet Commonly known as: MIRALAX  / GLYCOLAX  Take 17 g by mouth 2 (two) times daily.   zolpidem  5 MG tablet Commonly known as: AMBIEN  TAKE ONE TABLET AT BEDTIME AS NEEDED FOR SLEEP        Follow-up Information     Agustina Aldrich, MD. Schedule an appointment as soon as possible for a visit in 2 week(s).   Specialty: Neurosurgery Contact information: [address] [place] [address] [phone]                 Signed: Henreitta Locus, DNP, AGNP-C Nurse Practitioner  [facility] [address], [place], [place] [address] P: [phone]    F: [phone]  04/22/2024, 1:00 PM

## 2024-04-22 NOTE — Progress Notes (Signed)
 PHARMACY NOTE:  ANTIMICROBIAL RENAL DOSAGE ADJUSTMENT  Current antimicrobial regimen includes a mismatch between antimicrobial dosage and estimated renal function.  As per policy approved by the Pharmacy & Therapeutics and Medical Executive Committees, the antimicrobial dosage will be adjusted accordingly.  Current antimicrobial dosage:  Levofloxacin  750mg  Q24H  Indication: Wound infection   Renal Function:  Estimated Creatinine Clearance: 45.9 mL/min (A) (by C-G formula based on SCr of 1.89 mg/dL (H)). []      On intermittent HD, scheduled: []      On CRRT    Antimicrobial dosage has been changed to:  Levofloxacin  750mg  Q48H  Additional comments:   Thank you for allowing pharmacy to be a part of this patient's care.  Harvest Lineman, [facility] 04/22/2024 12:18 PM

## 2024-04-23 ENCOUNTER — Telehealth: Payer: Self-pay

## 2024-04-23 ENCOUNTER — Encounter: Payer: Self-pay | Admitting: Student

## 2024-04-23 ENCOUNTER — Encounter: Payer: Self-pay | Admitting: Internal Medicine

## 2024-04-23 DIAGNOSIS — N179 Acute kidney failure, unspecified: Secondary | ICD-10-CM | POA: Insufficient documentation

## 2024-04-23 DIAGNOSIS — E875 Hyperkalemia: Secondary | ICD-10-CM | POA: Insufficient documentation

## 2024-04-23 DIAGNOSIS — N1832 Chronic kidney disease, stage 3b: Secondary | ICD-10-CM | POA: Insufficient documentation

## 2024-04-23 NOTE — Patient Instructions (Incomplete)
      Blood work was ordered.       Medications changes include :   None

## 2024-04-23 NOTE — Progress Notes (Unsigned)
 Subjective:    Patient ID: Tim Torres, male    DOB: [birth year], 73 y.o.   MRN: [id number]     HPI Tim Torres is here for follow up from the hospital  Admitted 5/29-6/8 for wound infection  He underwent right peroneal nerve decompression on 5/16.  Initially after surgery he did well.  On day 5 he started to experience increased pain, drainage from the wound.  The symptoms worsened and he reported chills with low-grade temperature.  He was advised to go to the ED for further evaluation.  He was admitted for IV antibiotics.  His hospital course was complicated by elevated LFTs, AKI, hyperkalemia and pain control.  He was transitioned to oral levofloxacin  and linezolid  x 14 days.  He was advised to follow-up with Dr. Gwendlyn Lemmings.  His hospital course was complicated by worsening kidney function, high potassium.  He was given Lokelma .  He was encouraged to stay in the hospital longer, But after being there so long wanted to leave and he was discharged home 2 days ago.  Drinking good amount of water.  Eating regularly.  He has not changed his wound.  Wound care was ordered and they should be coming tomorrow.  He has not had any fevers or chills.  He is taking his medications as prescribed.  Medications and allergies reviewed with patient and updated if appropriate.  Current Outpatient Medications on File Prior to Visit  Medication Sig Dispense Refill   allopurinol  (ZYLOPRIM ) 300 MG tablet Take 1 tablet (300 mg total) by mouth daily. 90 tablet 2   aspirin  325 MG tablet Take 325 mg by mouth daily.     diazepam  (VALIUM ) 5 MG tablet Take 1 tablet (5 mg total) by mouth every 12 (twelve) hours as needed for muscle spasms. 20 tablet 0   gabapentin  (NEURONTIN ) 300 MG capsule Take 300 mg by mouth 3 (three) times daily.     glipiZIDE  (GLUCOTROL ) 10 MG tablet Take 1 tablet (10 mg total) by mouth 2 (two) times daily before a meal. 60 tablet 0   levofloxacin  (LEVAQUIN ) 750 MG tablet Take 1 tablet  (750 mg total) by mouth daily for 11 days. 11 tablet 0   linezolid  (ZYVOX ) 600 MG tablet Take 1 tablet (600 mg total) by mouth 2 (two) times daily for 11 days. 22 tablet 0   lovastatin (MEVACOR) 40 MG tablet TAKE TWO TABLETS NIGHTLY 180 tablet 1   meclizine  (ANTIVERT ) 25 MG tablet Take 1 tablet (25 mg total) by mouth 3 (three) times daily as needed for dizziness or nausea. 30 tablet 0   metFORMIN  (GLUCOPHAGE ) 1000 MG tablet Take 1 tablet (1,000 mg total) by mouth 2 (two) times daily with a meal. 60 tablet 0   metoprolol  tartrate (LOPRESSOR ) 50 MG tablet TAKE ONE TABLET TWICE DAILY 180 tablet 1   oxyCODONE -acetaminophen  (PERCOCET/ROXICET) 5-325 MG tablet Take 1-2 tablets by mouth every 4 (four) hours as needed for moderate pain (pain score 4-6). 30 tablet 0   zolpidem  (AMBIEN ) 5 MG tablet TAKE ONE TABLET AT BEDTIME AS NEEDED FOR SLEEP 30 tablet 1   MAGNESIUM  PO Take by mouth. (Patient not taking: Reported on 04/24/2024)     polyethylene glycol (MIRALAX  / GLYCOLAX ) 17 g packet Take 17 g by mouth 2 (two) times daily. (Patient not taking: Reported on 04/24/2024) 14 each 0   No current facility-administered medications on file prior to visit.     Review of Systems  Constitutional:  Negative for chills,  diaphoresis and fever.  Respiratory:  Negative for cough, shortness of breath and wheezing.   Cardiovascular:  Negative for chest pain, palpitations and leg swelling (in left leg).  Gastrointestinal:  Positive for diarrhea.  Neurological:  Negative for dizziness, light-headedness and headaches.       Objective:   Vitals:   04/24/24 1103  BP: 130/80  Pulse: 68  Temp: 98.1 F (36.7 C)   BP Readings from Last 3 Encounters:  04/24/24 130/80  04/22/24 102/71  01/23/24 128/72   Wt Readings from Last 3 Encounters:  04/24/24 250 lb (113.4 kg)  04/20/24 242 lb 8.1 oz (110 kg)  01/23/24 250 lb (113.4 kg)   Body mass index is 33.91 kg/m.    Physical Exam Constitutional:      General: He  is not in acute distress.    Appearance: Normal appearance. He is not ill-appearing.  HENT:     Head: Normocephalic and atraumatic.  Musculoskeletal:     Right lower leg: Edema (Mild) present.     Left lower leg: No edema.  Skin:    General: Skin is warm and dry.     Comments: Right proximal lateral lower leg open wound without active discharge or bleeding.  Granulation tissue present.  Slight tenderness and induration around wound.  He does feel it does look better than previous  Neurological:     Mental Status: He is alert.        Lab Results  Component Value Date   WBC 6.5 04/18/2024   HGB 11.2 (L) 04/18/2024   HCT 33.2 (L) 04/18/2024   PLT 191 04/18/2024   GLUCOSE 68 (L) 04/22/2024   CHOL 143 01/23/2024   TRIG 271.0 (H) 01/23/2024   HDL 40.40 01/23/2024   LDLCALC 48 01/23/2024   ALT 94 (H) 04/19/2024   AST 45 (H) 04/19/2024   NA 133 (L) 04/22/2024   K 6.2 (H) 04/22/2024   CL 103 04/22/2024   CREATININE 2.22 (H) 04/22/2024   BUN 44 (H) 04/22/2024   CO2 23 04/22/2024   INR 1.0 11/04/2023   HGBA1C 8.1 (H) 04/18/2024   MICROALBUR 2.8 (H) 01/23/2024   NM Hepato W/EF CLINICAL DATA:  Cholelithiasis  EXAM: NUCLEAR MEDICINE HEPATOBILIARY IMAGING WITH GALLBLADDER EF  TECHNIQUE: Sequential images of the abdomen were obtained out to 60 minutes following intravenous administration of radiopharmaceutical. After oral ingestion of Ensure, gallbladder ejection fraction was determined. At 60 min, normal ejection fraction is greater than 33%.  RADIOPHARMACEUTICALS:  5.0 mCi Tc-90m  Choletec  IV  COMPARISON:  04/18/2024 ultrasound  FINDINGS: Prompt uptake and biliary excretion of activity by the liver is seen. Gallbladder activity is visualized, consistent with patency of cystic duct. Biliary activity passes into small bowel, consistent with patent common bile duct.  Calculated gallbladder ejection fraction is 93%. (Normal gallbladder ejection fraction with Ensure is  greater than 33% and less than 80%.)  IMPRESSION: 1. No evidence of biliary obstruction or acute cholecystitis. 2. Slightly elevated gallbladder ejection fraction after fatty meal, a nonspecific finding that could be related to biliary hyperkinesia.  Electronically Signed   By: Bobbye Burrow M.D.   On: 04/18/2024 18:08 US  Abdomen Limited RUQ (LIVER/GB) CLINICAL DATA:  Transaminitis.  EXAM: ULTRASOUND ABDOMEN LIMITED RIGHT UPPER QUADRANT  COMPARISON:  None Available.  FINDINGS: Gallbladder:  A 2.3 cm shadowing echogenic gallstone is seen within the neck of the gallbladder. The gallbladder wall measures 4.3 mm in thickness. The presence or absence of a sonographic Abigail Abler sign was not  documented by the sonographer.  Common bile duct:  Diameter: 2.1 mm  Liver:  No focal lesion identified. Within normal limits in parenchymal echogenicity. Portal vein is patent on color Doppler imaging with normal direction of blood flow towards the liver.  Other: None.  IMPRESSION: 1. Cholelithiasis and mild gallbladder wall thickening without additional findings to suggest the presence of acute cholecystitis. Further evaluation with a nuclear medicine hepatobiliary scan is recommended if this is of clinical concern.  Electronically Signed   By: Virgle Grime M.D.   On: 04/18/2024 02:09    Assessment & Plan:    See Problem List for Assessment and Plan of chronic medical problems.

## 2024-04-23 NOTE — Transitions of Care (Post Inpatient/ED Visit) (Signed)
   04/23/2024  Name: Tim Torres MRN: [id number] DOB: 02/06/1951  Today's TOC FU Call Status: Today's TOC FU Call Status:: Unsuccessful Call (1st Attempt) Unsuccessful Call (1st Attempt) Date: 04/23/24  Attempted to reach the patient regarding the most recent Inpatient/ED visit.  Follow Up Plan: Additional outreach attempts will be made to reach the patient to complete the Transitions of Care (Post Inpatient/ED visit) call.   Signature  Germain Kohler, CMA (AAMA)  [facility] [phone]

## 2024-04-24 ENCOUNTER — Ambulatory Visit: Admitting: Internal Medicine

## 2024-04-24 ENCOUNTER — Ambulatory Visit: Payer: Self-pay | Admitting: Internal Medicine

## 2024-04-24 VITALS — BP 130/80 | HR 68 | Temp 98.1°F | Ht 72.0 in | Wt 250.0 lb

## 2024-04-24 DIAGNOSIS — T8149XA Infection following a procedure, other surgical site, initial encounter: Secondary | ICD-10-CM

## 2024-04-24 DIAGNOSIS — E119 Type 2 diabetes mellitus without complications: Secondary | ICD-10-CM

## 2024-04-24 DIAGNOSIS — E782 Mixed hyperlipidemia: Secondary | ICD-10-CM

## 2024-04-24 DIAGNOSIS — I10 Essential (primary) hypertension: Secondary | ICD-10-CM | POA: Diagnosis not present

## 2024-04-24 DIAGNOSIS — N179 Acute kidney failure, unspecified: Secondary | ICD-10-CM | POA: Diagnosis not present

## 2024-04-24 DIAGNOSIS — Z7984 Long term (current) use of oral hypoglycemic drugs: Secondary | ICD-10-CM | POA: Diagnosis not present

## 2024-04-24 DIAGNOSIS — E875 Hyperkalemia: Secondary | ICD-10-CM

## 2024-04-24 DIAGNOSIS — N1832 Chronic kidney disease, stage 3b: Secondary | ICD-10-CM

## 2024-04-24 LAB — CBC WITH DIFFERENTIAL/PLATELET
Basophils Absolute: 0 10*3/uL (ref 0.0–0.1)
Basophils Relative: 0.4 % (ref 0.0–3.0)
Eosinophils Absolute: 0.1 10*3/uL (ref 0.0–0.7)
Eosinophils Relative: 2.3 % (ref 0.0–5.0)
HCT: 34.1 % — ABNORMAL LOW (ref 39.0–52.0)
Hemoglobin: 11.1 g/dL — ABNORMAL LOW (ref 13.0–17.0)
Lymphocytes Relative: 24.1 % (ref 12.0–46.0)
Lymphs Abs: 1.4 10*3/uL (ref 0.7–4.0)
MCHC: 32.6 g/dL (ref 30.0–36.0)
MCV: 91.6 fl (ref 78.0–100.0)
Monocytes Absolute: 0.4 10*3/uL (ref 0.1–1.0)
Monocytes Relative: 7.3 % (ref 3.0–12.0)
Neutro Abs: 3.7 10*3/uL (ref 1.4–7.7)
Neutrophils Relative %: 65.9 % (ref 43.0–77.0)
Platelets: 186 10*3/uL (ref 150.0–400.0)
RBC: 3.72 Mil/uL — ABNORMAL LOW (ref 4.22–5.81)
RDW: 14.5 % (ref 11.5–15.5)
WBC: 5.6 10*3/uL (ref 4.0–10.5)

## 2024-04-24 LAB — BASIC METABOLIC PANEL WITH GFR
BUN/Creatinine Ratio: 23 (calc) — ABNORMAL HIGH (ref 6–22)
BUN: 47 mg/dL — ABNORMAL HIGH (ref 7–25)
CO2: 24 mmol/L (ref 20–32)
Calcium: 9 mg/dL (ref 8.6–10.3)
Chloride: 107 mmol/L (ref 98–110)
Creat: 2.08 mg/dL — ABNORMAL HIGH (ref 0.70–1.28)
Glucose, Bld: 68 mg/dL (ref 65–99)
Potassium: 5.9 mmol/L — ABNORMAL HIGH (ref 3.5–5.3)
Sodium: 138 mmol/L (ref 135–146)
eGFR: 33 mL/min/{1.73_m2} — ABNORMAL LOW (ref >=60)

## 2024-04-24 MED ORDER — LOKELMA 10 G PO PACK: 10.0000 g | PACK | Freq: Every day | ORAL | 0 refills | Status: AC

## 2024-04-24 NOTE — Assessment & Plan Note (Signed)
 Acute Related to AKI on CKD Received lokelma  in the hospital BMP today - K was still elevated but improved - lokelma  10 g x 2 days BMP next week

## 2024-04-24 NOTE — Assessment & Plan Note (Signed)
 Chronic BP well controlled Continue metoprolol  50 mg bid

## 2024-04-24 NOTE — Assessment & Plan Note (Addendum)
 Chronic  Lab Results  Component Value Date   HGBA1C 8.1 (H) 04/18/2024   Sugars not ideally controlled On Metformin  xr 1000 mg am, 1000 mg pm, glipizide  10 mg twice daily AC BMP For now decrease metformin  to once daily until GFR improves

## 2024-04-24 NOTE — Assessment & Plan Note (Signed)
 Chronic Continue lovastatin 40 mg daily

## 2024-04-24 NOTE — Assessment & Plan Note (Addendum)
 Acute on chronic CKD Kidney function was worsening prior to him leaving the hospital and potassium was increasing BMP today-GFR slightly better than day of discharge Decrease metformin  to once daily, continue glipizide  Continue linezolid  at current dose, decrease Levaquin  to every other day BMP in 1 week Continue increased fluids

## 2024-04-24 NOTE — Assessment & Plan Note (Signed)
 Acute S/p hospitalization and IV abx On levaquin  750 mg daily, linezolid  600 mg bid Adjust dose of levaquin  to every other day based on GFR

## 2024-04-25 DIAGNOSIS — T8144XA Sepsis following a procedure, initial encounter: Secondary | ICD-10-CM | POA: Diagnosis not present

## 2024-04-25 DIAGNOSIS — D631 Anemia in chronic kidney disease: Secondary | ICD-10-CM | POA: Diagnosis not present

## 2024-04-25 DIAGNOSIS — I129 Hypertensive chronic kidney disease with stage 1 through stage 4 chronic kidney disease, or unspecified chronic kidney disease: Secondary | ICD-10-CM | POA: Diagnosis not present

## 2024-04-25 DIAGNOSIS — E1122 Type 2 diabetes mellitus with diabetic chronic kidney disease: Secondary | ICD-10-CM | POA: Diagnosis not present

## 2024-04-25 DIAGNOSIS — E1100 Type 2 diabetes mellitus with hyperosmolarity without nonketotic hyperglycemic-hyperosmolar coma (NKHHC): Secondary | ICD-10-CM | POA: Diagnosis not present

## 2024-04-25 DIAGNOSIS — T8141XA Infection following a procedure, superficial incisional surgical site, initial encounter: Secondary | ICD-10-CM | POA: Diagnosis not present

## 2024-04-25 DIAGNOSIS — N1831 Chronic kidney disease, stage 3a: Secondary | ICD-10-CM | POA: Diagnosis not present

## 2024-04-25 DIAGNOSIS — L03115 Cellulitis of right lower limb: Secondary | ICD-10-CM | POA: Diagnosis not present

## 2024-04-25 DIAGNOSIS — A419 Sepsis, unspecified organism: Secondary | ICD-10-CM | POA: Diagnosis not present

## 2024-04-26 ENCOUNTER — Other Ambulatory Visit: Payer: Self-pay | Admitting: Internal Medicine

## 2024-04-27 ENCOUNTER — Telehealth: Payer: Self-pay | Admitting: Internal Medicine

## 2024-04-27 DIAGNOSIS — E1100 Type 2 diabetes mellitus with hyperosmolarity without nonketotic hyperglycemic-hyperosmolar coma (NKHHC): Secondary | ICD-10-CM | POA: Diagnosis not present

## 2024-04-27 DIAGNOSIS — E1122 Type 2 diabetes mellitus with diabetic chronic kidney disease: Secondary | ICD-10-CM | POA: Diagnosis not present

## 2024-04-27 DIAGNOSIS — D631 Anemia in chronic kidney disease: Secondary | ICD-10-CM | POA: Diagnosis not present

## 2024-04-27 DIAGNOSIS — N1831 Chronic kidney disease, stage 3a: Secondary | ICD-10-CM | POA: Diagnosis not present

## 2024-04-27 DIAGNOSIS — T8144XA Sepsis following a procedure, initial encounter: Secondary | ICD-10-CM | POA: Diagnosis not present

## 2024-04-27 DIAGNOSIS — I129 Hypertensive chronic kidney disease with stage 1 through stage 4 chronic kidney disease, or unspecified chronic kidney disease: Secondary | ICD-10-CM | POA: Diagnosis not present

## 2024-04-27 DIAGNOSIS — T8141XA Infection following a procedure, superficial incisional surgical site, initial encounter: Secondary | ICD-10-CM | POA: Diagnosis not present

## 2024-04-27 DIAGNOSIS — A419 Sepsis, unspecified organism: Secondary | ICD-10-CM | POA: Diagnosis not present

## 2024-04-27 DIAGNOSIS — L03115 Cellulitis of right lower limb: Secondary | ICD-10-CM | POA: Diagnosis not present

## 2024-04-27 NOTE — Telephone Encounter (Signed)
 Yes that is Tim Torres

## 2024-04-27 NOTE — Telephone Encounter (Signed)
 Spoke with patient today and also left message for [place].

## 2024-04-27 NOTE — Telephone Encounter (Signed)
 Copied from CRM [id number]. Topic: Clinical - Home Health Verbal Orders >> Apr 27, 2024 12:51 PM Chuck Crater wrote: Caller/Agency: Sonja Hawkeye Number: [phone] Patient has a wound on right leg and family has betadine  and family wants to know if they can use that or anything else topically for infection. He is taking oral antibiotic.

## 2024-04-30 ENCOUNTER — Telehealth: Payer: Self-pay | Admitting: Internal Medicine

## 2024-04-30 DIAGNOSIS — T8141XA Infection following a procedure, superficial incisional surgical site, initial encounter: Secondary | ICD-10-CM | POA: Diagnosis not present

## 2024-04-30 DIAGNOSIS — E1122 Type 2 diabetes mellitus with diabetic chronic kidney disease: Secondary | ICD-10-CM | POA: Diagnosis not present

## 2024-04-30 DIAGNOSIS — L03115 Cellulitis of right lower limb: Secondary | ICD-10-CM | POA: Diagnosis not present

## 2024-04-30 DIAGNOSIS — A419 Sepsis, unspecified organism: Secondary | ICD-10-CM | POA: Diagnosis not present

## 2024-04-30 DIAGNOSIS — N1831 Chronic kidney disease, stage 3a: Secondary | ICD-10-CM | POA: Diagnosis not present

## 2024-04-30 DIAGNOSIS — T8144XA Sepsis following a procedure, initial encounter: Secondary | ICD-10-CM | POA: Diagnosis not present

## 2024-04-30 DIAGNOSIS — E1100 Type 2 diabetes mellitus with hyperosmolarity without nonketotic hyperglycemic-hyperosmolar coma (NKHHC): Secondary | ICD-10-CM | POA: Diagnosis not present

## 2024-04-30 DIAGNOSIS — I129 Hypertensive chronic kidney disease with stage 1 through stage 4 chronic kidney disease, or unspecified chronic kidney disease: Secondary | ICD-10-CM | POA: Diagnosis not present

## 2024-04-30 DIAGNOSIS — D631 Anemia in chronic kidney disease: Secondary | ICD-10-CM | POA: Diagnosis not present

## 2024-04-30 NOTE — Telephone Encounter (Unsigned)
 Copied from CRM [id number]. Topic: Clinical - Medication Question >> Apr 30, 2024  8:36 AM Chasity T wrote: Reason for CRM: Patient is calling in because he is wanting know know if Dr Donnette Gal can send over a new glucose meter and strips to the pharmacy.

## 2024-05-02 ENCOUNTER — Ambulatory Visit: Payer: Self-pay | Admitting: Internal Medicine

## 2024-05-02 ENCOUNTER — Telehealth: Payer: Self-pay | Admitting: Internal Medicine

## 2024-05-02 ENCOUNTER — Other Ambulatory Visit (INDEPENDENT_AMBULATORY_CARE_PROVIDER_SITE_OTHER)

## 2024-05-02 ENCOUNTER — Other Ambulatory Visit: Payer: Self-pay

## 2024-05-02 DIAGNOSIS — E119 Type 2 diabetes mellitus without complications: Secondary | ICD-10-CM

## 2024-05-02 DIAGNOSIS — E1100 Type 2 diabetes mellitus with hyperosmolarity without nonketotic hyperglycemic-hyperosmolar coma (NKHHC): Secondary | ICD-10-CM | POA: Diagnosis not present

## 2024-05-02 DIAGNOSIS — N1832 Chronic kidney disease, stage 3b: Secondary | ICD-10-CM

## 2024-05-02 DIAGNOSIS — A419 Sepsis, unspecified organism: Secondary | ICD-10-CM | POA: Diagnosis not present

## 2024-05-02 DIAGNOSIS — D631 Anemia in chronic kidney disease: Secondary | ICD-10-CM | POA: Diagnosis not present

## 2024-05-02 DIAGNOSIS — L03115 Cellulitis of right lower limb: Secondary | ICD-10-CM | POA: Diagnosis not present

## 2024-05-02 DIAGNOSIS — N1831 Chronic kidney disease, stage 3a: Secondary | ICD-10-CM | POA: Diagnosis not present

## 2024-05-02 DIAGNOSIS — T8144XA Sepsis following a procedure, initial encounter: Secondary | ICD-10-CM | POA: Diagnosis not present

## 2024-05-02 DIAGNOSIS — T8141XA Infection following a procedure, superficial incisional surgical site, initial encounter: Secondary | ICD-10-CM | POA: Diagnosis not present

## 2024-05-02 DIAGNOSIS — E1122 Type 2 diabetes mellitus with diabetic chronic kidney disease: Secondary | ICD-10-CM | POA: Diagnosis not present

## 2024-05-02 DIAGNOSIS — I129 Hypertensive chronic kidney disease with stage 1 through stage 4 chronic kidney disease, or unspecified chronic kidney disease: Secondary | ICD-10-CM | POA: Diagnosis not present

## 2024-05-02 LAB — BASIC METABOLIC PANEL WITH GFR
BUN: 33 mg/dL — ABNORMAL HIGH (ref 6–23)
CO2: 24 meq/L (ref 19–32)
Calcium: 9.2 mg/dL (ref 8.4–10.5)
Chloride: 106 meq/L (ref 96–112)
Creatinine, Ser: 1.83 mg/dL — ABNORMAL HIGH (ref 0.40–1.50)
GFR: 36.7 mL/min — ABNORMAL LOW (ref >60.00)
Glucose, Bld: 93 mg/dL (ref 70–99)
Potassium: 5 meq/L (ref 3.5–5.1)
Sodium: 137 meq/L (ref 135–145)

## 2024-05-02 MED ORDER — LANCET DEVICE MISC: 1.0000 | 5 refills | Status: AC

## 2024-05-02 MED ORDER — BLOOD GLUCOSE MONITORING SUPPL DEVI: 1.0000 | 0 refills | Status: AC

## 2024-05-02 MED ORDER — BLOOD GLUCOSE TEST VI STRP: 1.0000 | ORAL_STRIP | Freq: Three times a day (TID) | 5 refills | Status: AC

## 2024-05-02 NOTE — Telephone Encounter (Signed)
 Patient was prescribed an antibiotic and said it was hurting his stomach and making him nauseous. He has been eating with the antibiotic. Patient would like a call back from [place]. Best callback is [phone].

## 2024-05-02 NOTE — Telephone Encounter (Signed)
 How is the infection looking?  Dizzy have the nausea and stomach pain on the days that he is only taking the linezolid ?  Is he taking the medications with a full meal and a full glass of water?  I would like to see him for follow-up before the end of the month-we will need to recheck the kidney function again and if it still decreased adjust his medication.

## 2024-05-02 NOTE — Telephone Encounter (Signed)
 Sent today

## 2024-05-02 NOTE — Telephone Encounter (Signed)
 I think he finishes the antibiotic today, is that correct?  If he does finish it today the stomach pain should improve after not taking it any longer.

## 2024-05-04 DIAGNOSIS — I2510 Atherosclerotic heart disease of native coronary artery without angina pectoris: Secondary | ICD-10-CM

## 2024-05-04 DIAGNOSIS — I129 Hypertensive chronic kidney disease with stage 1 through stage 4 chronic kidney disease, or unspecified chronic kidney disease: Secondary | ICD-10-CM | POA: Diagnosis not present

## 2024-05-04 DIAGNOSIS — N1831 Chronic kidney disease, stage 3a: Secondary | ICD-10-CM | POA: Diagnosis not present

## 2024-05-04 DIAGNOSIS — T8144XA Sepsis following a procedure, initial encounter: Secondary | ICD-10-CM | POA: Diagnosis not present

## 2024-05-04 DIAGNOSIS — D631 Anemia in chronic kidney disease: Secondary | ICD-10-CM | POA: Diagnosis not present

## 2024-05-04 DIAGNOSIS — E1100 Type 2 diabetes mellitus with hyperosmolarity without nonketotic hyperglycemic-hyperosmolar coma (NKHHC): Secondary | ICD-10-CM | POA: Diagnosis not present

## 2024-05-04 DIAGNOSIS — E1122 Type 2 diabetes mellitus with diabetic chronic kidney disease: Secondary | ICD-10-CM | POA: Diagnosis not present

## 2024-05-04 DIAGNOSIS — T8141XA Infection following a procedure, superficial incisional surgical site, initial encounter: Secondary | ICD-10-CM | POA: Diagnosis not present

## 2024-05-04 DIAGNOSIS — E875 Hyperkalemia: Secondary | ICD-10-CM

## 2024-05-04 DIAGNOSIS — A419 Sepsis, unspecified organism: Secondary | ICD-10-CM | POA: Diagnosis not present

## 2024-05-04 DIAGNOSIS — L03115 Cellulitis of right lower limb: Secondary | ICD-10-CM | POA: Diagnosis not present

## 2024-05-04 DIAGNOSIS — N179 Acute kidney failure, unspecified: Secondary | ICD-10-CM

## 2024-05-04 NOTE — Telephone Encounter (Signed)
 Spoke with patient today.

## 2024-05-07 DIAGNOSIS — A419 Sepsis, unspecified organism: Secondary | ICD-10-CM | POA: Diagnosis not present

## 2024-05-07 DIAGNOSIS — E1122 Type 2 diabetes mellitus with diabetic chronic kidney disease: Secondary | ICD-10-CM | POA: Diagnosis not present

## 2024-05-07 DIAGNOSIS — I129 Hypertensive chronic kidney disease with stage 1 through stage 4 chronic kidney disease, or unspecified chronic kidney disease: Secondary | ICD-10-CM | POA: Diagnosis not present

## 2024-05-07 DIAGNOSIS — N1831 Chronic kidney disease, stage 3a: Secondary | ICD-10-CM | POA: Diagnosis not present

## 2024-05-07 DIAGNOSIS — T8144XA Sepsis following a procedure, initial encounter: Secondary | ICD-10-CM | POA: Diagnosis not present

## 2024-05-07 DIAGNOSIS — T8141XA Infection following a procedure, superficial incisional surgical site, initial encounter: Secondary | ICD-10-CM | POA: Diagnosis not present

## 2024-05-07 DIAGNOSIS — E1100 Type 2 diabetes mellitus with hyperosmolarity without nonketotic hyperglycemic-hyperosmolar coma (NKHHC): Secondary | ICD-10-CM | POA: Diagnosis not present

## 2024-05-07 DIAGNOSIS — D631 Anemia in chronic kidney disease: Secondary | ICD-10-CM | POA: Diagnosis not present

## 2024-05-07 DIAGNOSIS — L03115 Cellulitis of right lower limb: Secondary | ICD-10-CM | POA: Diagnosis not present

## 2024-05-08 ENCOUNTER — Encounter: Payer: Self-pay | Admitting: Internal Medicine

## 2024-05-08 NOTE — Patient Instructions (Incomplete)
      Blood work was ordered.       Medications changes include :   famotidine 40 mg daily.  Stop carafate.       Return for follow up as scheduled.

## 2024-05-08 NOTE — Progress Notes (Unsigned)
 Subjective:    Patient ID: Tim Torres, male    DOB: 1951/05/23, 73 y.o.   MRN: [id number]     HPI [place] is here for follow up of his chronic medical problems.  Has completed antibiotics for cellulitis, abscess.     Medications and allergies reviewed with patient and updated if appropriate.  Current Outpatient Medications on File Prior to Visit  Medication Sig Dispense Refill   allopurinol  (ZYLOPRIM ) 300 MG tablet Take 1 tablet (300 mg total) by mouth daily. 90 tablet 2   aspirin  325 MG tablet Take 325 mg by mouth daily.     Blood Glucose Monitoring Suppl DEVI 1 kit by Does not apply route as directed. May substitute to any manufacturer covered by patient's insurance. 1 each 0   diazepam  (VALIUM ) 5 MG tablet Take 1 tablet (5 mg total) by mouth every 12 (twelve) hours as needed for muscle spasms. 20 tablet 0   gabapentin  (NEURONTIN ) 300 MG capsule Take 300 mg by mouth 3 (three) times daily.     glipiZIDE  (GLUCOTROL ) 10 MG tablet Take 1 tablet (10 mg total) by mouth 2 (two) times daily before a meal. 60 tablet 0   Glucose Blood (BLOOD GLUCOSE TEST STRIPS) STRP 1 each by In Vitro route in the morning, at noon, and at bedtime. May substitute to any manufacturer covered by patient's insurance. 100 each 5   Lancet Device MISC 1 each by Does not apply route as directed. May substitute to any manufacturer covered by patient's insurance. 1 each 5   lovastatin (MEVACOR) 40 MG tablet TAKE TWO TABLETS NIGHTLY 180 tablet 1   meclizine  (ANTIVERT ) 25 MG tablet Take 1 tablet (25 mg total) by mouth 3 (three) times daily as needed for dizziness or nausea. 30 tablet 0   metFORMIN  (GLUCOPHAGE ) 1000 MG tablet Take 1 tablet (1,000 mg total) by mouth 2 (two) times daily with a meal. 60 tablet 0   metoprolol  tartrate (LOPRESSOR ) 50 MG tablet TAKE ONE TABLET TWICE DAILY 180 tablet 1   oxyCODONE -acetaminophen  (PERCOCET/ROXICET) 5-325 MG tablet Take 1-2 tablets by mouth every 4 (four) hours as  needed for moderate pain (pain score 4-6). 30 tablet 0   polyethylene glycol (MIRALAX  / GLYCOLAX ) 17 g packet Take 17 g by mouth 2 (two) times daily. (Patient not taking: Reported on 04/24/2024) 14 each 0   zolpidem  (AMBIEN ) 5 MG tablet TAKE ONE TABLET AT BEDTIME AS NEEDED FOR SLEEP 30 tablet 5   No current facility-administered medications on file prior to visit.     Review of Systems     Objective:  There were no vitals filed for this visit. BP Readings from Last 3 Encounters:  04/24/24 130/80  04/22/24 102/71  01/23/24 128/72   Wt Readings from Last 3 Encounters:  04/24/24 250 lb (113.4 kg)  04/20/24 242 lb 8.1 oz (110 kg)  01/23/24 250 lb (113.4 kg)   There is no height or weight on file to calculate BMI.    Physical Exam     Lab Results  Component Value Date   WBC 5.6 04/24/2024   HGB 11.1 (L) 04/24/2024   HCT 34.1 (L) 04/24/2024   PLT 186.0 04/24/2024   GLUCOSE 93 05/02/2024   CHOL 143 01/23/2024   TRIG 271.0 (H) 01/23/2024   HDL 40.40 01/23/2024   LDLCALC 48 01/23/2024   ALT 94 (H) 04/19/2024   AST 45 (H) 04/19/2024   NA 137 05/02/2024   K 5.0 05/02/2024  CL 106 05/02/2024   CREATININE 1.83 (H) 05/02/2024   BUN 33 (H) 05/02/2024   CO2 24 05/02/2024   INR 1.0 11/04/2023   HGBA1C 8.1 (H) 04/18/2024   MICROALBUR 2.8 (H) 01/23/2024     Assessment & Plan:    See Problem List for Assessment and Plan of chronic medical problems.

## 2024-05-09 ENCOUNTER — Ambulatory Visit (INDEPENDENT_AMBULATORY_CARE_PROVIDER_SITE_OTHER): Admitting: Internal Medicine

## 2024-05-09 VITALS — BP 120/80 | HR 78 | Temp 98.0°F | Ht 72.0 in | Wt 245.0 lb

## 2024-05-09 DIAGNOSIS — I10 Essential (primary) hypertension: Secondary | ICD-10-CM

## 2024-05-09 DIAGNOSIS — N1832 Chronic kidney disease, stage 3b: Secondary | ICD-10-CM | POA: Diagnosis not present

## 2024-05-09 DIAGNOSIS — N1831 Chronic kidney disease, stage 3a: Secondary | ICD-10-CM

## 2024-05-09 DIAGNOSIS — Z7984 Long term (current) use of oral hypoglycemic drugs: Secondary | ICD-10-CM

## 2024-05-09 DIAGNOSIS — E1122 Type 2 diabetes mellitus with diabetic chronic kidney disease: Secondary | ICD-10-CM

## 2024-05-09 DIAGNOSIS — L03115 Cellulitis of right lower limb: Secondary | ICD-10-CM

## 2024-05-09 LAB — COMPREHENSIVE METABOLIC PANEL WITH GFR
ALT: 19 U/L (ref 0–53)
AST: 26 U/L (ref 0–37)
Albumin: 4 g/dL (ref 3.5–5.2)
Alkaline Phosphatase: 82 U/L (ref 39–117)
BUN: 22 mg/dL (ref 6–23)
CO2: 26 meq/L (ref 19–32)
Calcium: 8.8 mg/dL (ref 8.4–10.5)
Chloride: 107 meq/L (ref 96–112)
Creatinine, Ser: 1.58 mg/dL — ABNORMAL HIGH (ref 0.40–1.50)
GFR: 43.76 mL/min — ABNORMAL LOW (ref >60.00)
Glucose, Bld: 87 mg/dL (ref 70–99)
Potassium: 4.6 meq/L (ref 3.5–5.1)
Sodium: 140 meq/L (ref 135–145)
Total Bilirubin: 0.5 mg/dL (ref 0.2–1.2)
Total Protein: 7 g/dL (ref 6.0–8.3)

## 2024-05-09 LAB — CBC WITH DIFFERENTIAL/PLATELET
Basophils Absolute: 0 10*3/uL (ref 0.0–0.1)
Basophils Relative: 0.4 % (ref 0.0–3.0)
Eosinophils Absolute: 0.6 10*3/uL (ref 0.0–0.7)
Eosinophils Relative: 7.3 % — ABNORMAL HIGH (ref 0.0–5.0)
HCT: 28.4 % — ABNORMAL LOW (ref 39.0–52.0)
Hemoglobin: 9.4 g/dL — ABNORMAL LOW (ref 13.0–17.0)
Lymphocytes Relative: 26 % (ref 12.0–46.0)
Lymphs Abs: 2.2 10*3/uL (ref 0.7–4.0)
MCHC: 33.1 g/dL (ref 30.0–36.0)
MCV: 89.9 fl (ref 78.0–100.0)
Monocytes Absolute: 1 10*3/uL (ref 0.1–1.0)
Monocytes Relative: 11.9 % (ref 3.0–12.0)
Neutro Abs: 4.5 10*3/uL (ref 1.4–7.7)
Neutrophils Relative %: 54.4 % (ref 43.0–77.0)
Platelets: 76 10*3/uL — ABNORMAL LOW (ref 150.0–400.0)
RBC: 3.16 Mil/uL — ABNORMAL LOW (ref 4.22–5.81)
RDW: 15 % (ref 11.5–15.5)
WBC: 8.3 10*3/uL (ref 4.0–10.5)

## 2024-05-09 MED ORDER — FAMOTIDINE 40 MG PO TABS: 40.0000 mg | ORAL_TABLET | Freq: Every day | ORAL | 1 refills | Status: AC

## 2024-05-09 NOTE — Assessment & Plan Note (Signed)
 Chronic Related to NSAID use No longer takes any NSAIDs Encouraged good hydration Stressed good BP and sugar control Blood pressure has been well-controlled at home and was better on repeat here today Recheck CMP today May need to consider nephrology referral

## 2024-05-09 NOTE — Assessment & Plan Note (Signed)
 Chronic BP well controlled on repeat-slightly elevated initially Continue metoprolol  50 mg bid Encouraged to monitor at home

## 2024-05-09 NOTE — Assessment & Plan Note (Signed)
 Resolved Still has open wound, but this is improving Has home health nurse coming and monitoring closely-wound is getting smaller-no discharge per patient Wound nurse will continue to monitor Sees neurosurgery tomorrow so will not evaluate proximally today

## 2024-05-09 NOTE — Assessment & Plan Note (Signed)
 Chronic  Lab Results  Component Value Date   HGBA1C 8.1 (H) 04/18/2024   Sugars not ideally controlled-stressed good control with help affect his kidneys On Metformin  xr 1000 mg am, 1000 mg pm, glipizide  10 mg twice daily AC Was on Ozempic and he gained weight-does not want to take 1 of those medications Does not want to go on insulin  CMP-we did decrease metformin  to once a day because of kidney function and will reevaluate after blood work

## 2024-05-12 ENCOUNTER — Ambulatory Visit: Payer: Self-pay | Admitting: Internal Medicine

## 2024-05-12 DIAGNOSIS — D649 Anemia, unspecified: Secondary | ICD-10-CM

## 2024-05-15 ENCOUNTER — Other Ambulatory Visit

## 2024-05-15 ENCOUNTER — Ambulatory Visit: Payer: Self-pay | Admitting: Internal Medicine

## 2024-05-15 ENCOUNTER — Observation Stay (HOSPITAL_COMMUNITY)
Admission: EM | Admit: 2024-05-15 | Discharge: 2024-05-16 | Disposition: A | Discharge status: Home / Self Care | Source: Ambulatory Visit | Attending: Family Medicine | Admitting: Family Medicine

## 2024-05-15 ENCOUNTER — Encounter (HOSPITAL_COMMUNITY): Payer: Self-pay | Admitting: Emergency Medicine

## 2024-05-15 ENCOUNTER — Other Ambulatory Visit: Payer: Self-pay

## 2024-05-15 DIAGNOSIS — E785 Hyperlipidemia, unspecified: Secondary | ICD-10-CM | POA: Insufficient documentation

## 2024-05-15 DIAGNOSIS — N179 Acute kidney failure, unspecified: Secondary | ICD-10-CM | POA: Diagnosis not present

## 2024-05-15 DIAGNOSIS — Z7984 Long term (current) use of oral hypoglycemic drugs: Secondary | ICD-10-CM | POA: Diagnosis not present

## 2024-05-15 DIAGNOSIS — K922 Gastrointestinal hemorrhage, unspecified: Principal | ICD-10-CM | POA: Insufficient documentation

## 2024-05-15 DIAGNOSIS — E119 Type 2 diabetes mellitus without complications: Secondary | ICD-10-CM

## 2024-05-15 DIAGNOSIS — Z87891 Personal history of nicotine dependence: Secondary | ICD-10-CM | POA: Diagnosis not present

## 2024-05-15 DIAGNOSIS — D62 Acute posthemorrhagic anemia: Secondary | ICD-10-CM | POA: Insufficient documentation

## 2024-05-15 DIAGNOSIS — Z8616 Personal history of COVID-19: Secondary | ICD-10-CM | POA: Insufficient documentation

## 2024-05-15 DIAGNOSIS — Z7982 Long term (current) use of aspirin: Secondary | ICD-10-CM | POA: Diagnosis not present

## 2024-05-15 DIAGNOSIS — N1832 Chronic kidney disease, stage 3b: Secondary | ICD-10-CM | POA: Diagnosis not present

## 2024-05-15 DIAGNOSIS — E1159 Type 2 diabetes mellitus with other circulatory complications: Secondary | ICD-10-CM | POA: Diagnosis present

## 2024-05-15 DIAGNOSIS — K2980 Duodenitis without bleeding: Secondary | ICD-10-CM | POA: Diagnosis not present

## 2024-05-15 DIAGNOSIS — D649 Anemia, unspecified: Secondary | ICD-10-CM | POA: Diagnosis not present

## 2024-05-15 DIAGNOSIS — K2970 Gastritis, unspecified, without bleeding: Secondary | ICD-10-CM | POA: Insufficient documentation

## 2024-05-15 DIAGNOSIS — I10 Essential (primary) hypertension: Secondary | ICD-10-CM | POA: Diagnosis not present

## 2024-05-15 DIAGNOSIS — I129 Hypertensive chronic kidney disease with stage 1 through stage 4 chronic kidney disease, or unspecified chronic kidney disease: Secondary | ICD-10-CM | POA: Diagnosis not present

## 2024-05-15 DIAGNOSIS — Z79899 Other long term (current) drug therapy: Secondary | ICD-10-CM | POA: Insufficient documentation

## 2024-05-15 DIAGNOSIS — E875 Hyperkalemia: Secondary | ICD-10-CM | POA: Diagnosis not present

## 2024-05-15 DIAGNOSIS — E1122 Type 2 diabetes mellitus with diabetic chronic kidney disease: Secondary | ICD-10-CM | POA: Diagnosis not present

## 2024-05-15 DIAGNOSIS — Z96642 Presence of left artificial hip joint: Secondary | ICD-10-CM | POA: Insufficient documentation

## 2024-05-15 DIAGNOSIS — K921 Melena: Secondary | ICD-10-CM | POA: Diagnosis not present

## 2024-05-15 LAB — CBC WITH DIFFERENTIAL/PLATELET
Abs Immature Granulocytes: 0.03 10*3/uL (ref 0.00–0.07)
Basophils Absolute: 0 10*3/uL (ref 0.0–0.1)
Basophils Absolute: 0 10*3/uL (ref 0.0–0.1)
Basophils Relative: 0.7 % (ref 0.0–3.0)
Basophils Relative: 1 %
Eosinophils Absolute: 0.3 10*3/uL (ref 0.0–0.7)
Eosinophils Absolute: 0.4 10*3/uL (ref 0.0–0.5)
Eosinophils Relative: 9 % — ABNORMAL HIGH (ref 0.0–5.0)
Eosinophils Relative: 8 %
HCT: 26.5 % — ABNORMAL LOW (ref 39.0–52.0)
HCT: 28.6 % — ABNORMAL LOW (ref 39.0–52.0)
Hemoglobin: 8.6 g/dL — ABNORMAL LOW (ref 13.0–17.0)
Hemoglobin: 8.9 g/dL — ABNORMAL LOW (ref 13.0–17.0)
Immature Granulocytes: 1 %
Lymphocytes Relative: 26.1 % (ref 12.0–46.0)
Lymphocytes Relative: 29 %
Lymphs Abs: 0.9 10*3/uL (ref 0.7–4.0)
Lymphs Abs: 1.3 10*3/uL (ref 0.7–4.0)
MCH: 30 pg (ref 26.0–34.0)
MCHC: 32.4 g/dL (ref 30.0–36.0)
MCHC: 31.1 g/dL (ref 30.0–36.0)
MCV: 90.2 fl (ref 78.0–100.0)
MCV: 96.3 fL (ref 80.0–100.0)
Monocytes Absolute: 0.4 10*3/uL (ref 0.1–1.0)
Monocytes Absolute: 0.5 10*3/uL (ref 0.1–1.0)
Monocytes Relative: 11.7 % (ref 3.0–12.0)
Monocytes Relative: 11 %
Neutro Abs: 1.8 10*3/uL (ref 1.4–7.7)
Neutro Abs: 2.2 10*3/uL (ref 1.7–7.7)
Neutrophils Relative %: 52.5 % (ref 43.0–77.0)
Neutrophils Relative %: 50 %
Platelets: 164 10*3/uL (ref 150.0–400.0)
Platelets: 181 10*3/uL (ref 150–400)
RBC: 2.93 Mil/uL — ABNORMAL LOW (ref 4.22–5.81)
RBC: 2.97 MIL/uL — ABNORMAL LOW (ref 4.22–5.81)
RDW: 15.5 % (ref 11.5–15.5)
RDW: 14.9 % (ref 11.5–15.5)
WBC: 3.5 10*3/uL — ABNORMAL LOW (ref 4.0–10.5)
WBC: 4.4 10*3/uL (ref 4.0–10.5)
nRBC: 0 % (ref 0.0–0.2)

## 2024-05-15 LAB — COMPREHENSIVE METABOLIC PANEL WITH GFR
ALT: 39 U/L (ref 0–44)
AST: 41 U/L (ref 15–41)
Albumin: 3.7 g/dL (ref 3.5–5.0)
Alkaline Phosphatase: 130 U/L — ABNORMAL HIGH (ref 38–126)
Anion gap: 10 (ref 5–15)
BUN: 34 mg/dL — ABNORMAL HIGH (ref 8–23)
CO2: 21 mmol/L — ABNORMAL LOW (ref 22–32)
Calcium: 9 mg/dL (ref 8.9–10.3)
Chloride: 106 mmol/L (ref 98–111)
Creatinine, Ser: 1.87 mg/dL — ABNORMAL HIGH (ref 0.61–1.24)
GFR, Estimated: 38 mL/min — ABNORMAL LOW (ref >60)
Glucose, Bld: 152 mg/dL — ABNORMAL HIGH (ref 70–99)
Potassium: 5.3 mmol/L — ABNORMAL HIGH (ref 3.5–5.1)
Sodium: 137 mmol/L (ref 135–145)
Total Bilirubin: 0.6 mg/dL (ref 0.0–1.2)
Total Protein: 7.3 g/dL (ref 6.5–8.1)

## 2024-05-15 LAB — IBC PANEL
Iron: 86 ug/dL (ref 42–165)
Saturation Ratios: 31.3 % (ref 20.0–50.0)
TIBC: 274.4 ug/dL (ref 250.0–450.0)
Transferrin: 196 mg/dL — ABNORMAL LOW (ref 212.0–360.0)

## 2024-05-15 LAB — POC OCCULT BLOOD, ED: Fecal Occult Bld: NEGATIVE

## 2024-05-15 LAB — FERRITIN: Ferritin: 213.7 ng/mL (ref 22.0–322.0)

## 2024-05-15 MED ORDER — ACETAMINOPHEN 325 MG PO TABS: 650.0000 mg | ORAL_TABLET | Freq: Four times a day (QID) | ORAL | Status: DC | PRN

## 2024-05-15 MED ORDER — LACTATED RINGERS IV SOLN: INTRAVENOUS | Status: AC

## 2024-05-15 MED ORDER — PANTOPRAZOLE SODIUM 40 MG IV SOLR
80.0000 mg | Freq: Once | INTRAVENOUS | Status: AC
  Administered 2024-05-15: 80 mg via INTRAVENOUS
  Filled 2024-05-15: qty 20

## 2024-05-15 MED ORDER — ACETAMINOPHEN 650 MG RE SUPP: 650.0000 mg | Freq: Four times a day (QID) | RECTAL | Status: DC | PRN

## 2024-05-15 MED ORDER — DEXTROSE 50 % IV SOLN: 1.0000 | INTRAVENOUS | Status: DC | PRN

## 2024-05-15 MED ORDER — NALOXONE HCL 0.4 MG/ML IJ SOLN: 0.4000 mg | INTRAMUSCULAR | Status: DC | PRN

## 2024-05-15 MED ORDER — INSULIN ASPART 100 UNIT/ML IJ SOLN
0.0000 [IU] | Freq: Four times a day (QID) | INTRAMUSCULAR | Status: DC
  Filled 2024-05-15: qty 0.06

## 2024-05-15 MED ORDER — PANTOPRAZOLE SODIUM 40 MG IV SOLR
40.0000 mg | Freq: Two times a day (BID) | INTRAVENOUS | Status: DC
  Administered 2024-05-16: 40 mg via INTRAVENOUS
  Filled 2024-05-15: qty 10

## 2024-05-15 MED ORDER — ONDANSETRON HCL 4 MG/2ML IJ SOLN: 4.0000 mg | Freq: Four times a day (QID) | INTRAMUSCULAR | Status: DC | PRN

## 2024-05-15 MED ORDER — FENTANYL CITRATE PF 50 MCG/ML IJ SOSY: 25.0000 ug | PREFILLED_SYRINGE | INTRAMUSCULAR | Status: DC | PRN

## 2024-05-15 NOTE — H&P (Signed)
 History and Physical      Tim Torres:[id number] DOB: 28-Nov-1950 DOA: 05/15/2024; DOS: 05/15/2024  PCP: Tim Glade PARAS, MD  Patient coming from: home   I have personally briefly reviewed patient's old medical records in [facility]  Chief Complaint: Low hemoglobin  HPI: Tim Torres is a 73 y.o. male with medical history significant for type 2 diabetes mellitus, essential hypertension, chronic kidney disease stage IIIb with baseline creatinine 1.4-1.6, who is admitted to [facility] on 05/15/2024 with suspected acute upper gastrointestinal bleed after presenting from home to [facility] ED complaining of bilateral as found by his PCP.   The patient reports that 1 week ago, he experienced approximately 1 day of epigastric discomfort that subsequently resolved and spontaneous fashion.  Over the day in which he was experiencing the epigastric discomfort, he also noted at least 1 episode of melena in the absence of any hematochezia.  This is subsequently resolved, with most recent bowel movements over the last 5 to 6 days associated with brown-colored stool in the absence of any melena or hematochezia.  He denies any associated nausea, vomiting, hematemesis.  No associated any subjective fever, chills, rigors, or generalized myalgias.  He also denies any recent chest pain, shortness of breath, palpitations, diaphoresis, dizziness, presyncope, or syncope.  He reports very occasional alcohol  consumption, without any routine consumption of alcohol .  Denies any known history of chronic underlying liver pathology.  He is on full dose aspirin  daily, but otherwise on no blood thinners as an outpatient.  His outpatient medications are also notable for metoprolol  to tartrate in the setting of history of essential hypertension.  Per chart review, his baseline hemoglobin range appears to be 11-13, with hemoglobin 11.1 on 04/24/2024 followed by 9.4 on 05/09/2024.  He was evaluated by his PCP  today, who performed CBC revealing hemoglobin of 8.6.  In the context of the above symptoms and interval decline in hemoglobin, PCP recommended that the patient be evaluated at the emergency department, brought in the patient to present to [facility] ED for further evaluation management of the above.    ED Course:  Vital signs in the ED were notable for the following: Afebrile; rates in the 70s to 80s; still blood pressures in the 150s; respiratory rate 16-18, oxygen saturation 98 to 99% on room air.  Labs were notable for the following: CMP was notable for the following: Potassium 5.3, bicarbonate 21, anion gap 10, BUN 34 compared to 22 on 05/09/2024, creatinine 1.87 compared to 1.58 on 05/09/2024, glucose 152, alkaline phosphatase 130.  Otherwise, liver enzymes are within normal limits.  CBC notable for white blood cell count 4400, hemoglobin 8.9 associated normocytic/normochromic properties as well as nonelevated RDW, platelet count 181.  DRE performed by EDP today revealed brown-colored stool that was fecal occult blood negative.  Per my interpretation, EKG in ED demonstrated the following: No EKG performed in the ED today.  Imaging in the ED, per corresponding formal radiology read, was notable for the following: No imaging performed in the ED today.  EDP has contacted on-call [facility] gastroenterology, Dr.Brahmbhatt, requesting consult for the morning (7/2).   While in the ED, the following were administered: Protonix  80 mg IV x 1 dose.  Subsequently, the patient was admitted for further evaluation management of suspected acute upper GI bleed complicated by subacute anemia, with presenting labs also notable for mild hyperkalemia as well as acute kidney injury superimposed on CKD 3B.     Review of  Systems: As per HPI otherwise 10 point review of systems negative.   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 His smoking use included cigarettes. He has never used smokeless tobacco. He reports current alcohol  use of about 1.0 standard drink of alcohol  per week. He reports that he does not use drugs.   Allergies  Allergen Reactions   Shellfish Allergy Shortness Of Breath and Swelling   Atorvastatin Other (See Comments)    Myalgia    Rosuvastatin Other (See Comments)    myalgia    Family history reviewed and not pertinent    Prior to Admission medications   Medication Sig Start Date End Date Taking? Authorizing Provider  Accu-Chek Softclix Lancets lancets 3 (three) times daily. 05/02/24   [provider]  allopurinol  (ZYLOPRIM ) 300 MG tablet Take 1 tablet (300 mg total) by mouth daily. 01/23/24   Tim Glade PARAS, MD  aspirin  325 MG tablet Take 325 mg by mouth daily.    [provider]  Blood Glucose Monitoring Suppl DEVI 1 kit by Does not apply route as directed. May substitute to any manufacturer covered by patient's insurance. 05/02/24   Tim Glade PARAS, MD  diazepam  (VALIUM ) 5 MG tablet Take 1 tablet (5 mg total) by mouth every 12 (twelve) hours as needed for muscle spasms. 04/22/24   Bergman, Meghan D, NP  famotidine  (PEPCID ) 40 MG tablet Take 1 tablet (40 mg total) by mouth daily. 05/09/24   Tim Glade PARAS, MD  gabapentin  (NEURONTIN ) 300 MG capsule Take 300 mg by mouth 3 (three) times daily.    [provider]  glipiZIDE   (GLUCOTROL ) 10 MG tablet Take 1 tablet (10 mg total) by mouth 2 (two) times daily before a meal. 04/22/24   Bergman, Meghan D, NP  Glucose Blood (BLOOD GLUCOSE TEST STRIPS) STRP 1 each by In Vitro route in the morning, at noon, and at bedtime. May substitute to any manufacturer covered by patient's insurance. 05/02/24 06/01/24  Tim Glade PARAS, MD  Lancet Device MISC 1 each by Does not apply route as directed. May substitute to any manufacturer covered by patient's insurance. 05/02/24 06/01/24  Tim Glade PARAS, MD  lovastatin (MEVACOR) 40 MG tablet TAKE TWO TABLETS NIGHTLY 12/01/23   Wilkinson, Dana E, FNP  metFORMIN  (GLUCOPHAGE ) 1000 MG tablet Take 1 tablet (1,000 mg total) by mouth 2 (two) times daily with a meal. 04/22/24   Bergman, Meghan D, NP  metoprolol  tartrate (LOPRESSOR ) 50 MG tablet TAKE ONE TABLET TWICE DAILY 02/09/24   Tim Glade PARAS, MD  zolpidem  (AMBIEN ) 5 MG tablet TAKE ONE TABLET AT BEDTIME AS NEEDED FOR SLEEP 04/26/24   Tim Glade PARAS, MD     Objective    Physical Exam: Vitals:   05/15/24 1639 05/15/24 1644  05/15/24 2128  BP: (!) 155/84  (!) 153/81  Pulse: 88  74  Resp: 18  16  Temp: 97.7 F (36.5 C)  (!) 97.5 F (36.4 C)  TempSrc: Oral  Oral  SpO2: 99%  98%  Weight:  111 kg   Height:  6' (1.829 m)     General: appears to be stated age; alert, oriented Skin: warm, dry, no rash Head:  AT/[place] Mouth:  Oral mucosa membranes appear moist, normal dentition Neck: supple; trachea midline Heart:  RRR; did not appreciate any M/R/G Lungs: CTAB, did not appreciate any wheezes, rales, or rhonchi Abdomen: + BS; soft, ND, NT Vascular: 2+ pedal pulses b/l; 2+ radial pulses b/l Extremities: no peripheral edema, no muscle wasting    Labs on Admission: I have personally reviewed following labs and imaging studies  CBC: Recent Labs  Lab 05/09/24 1018 05/15/24 0759 05/15/24 1656  WBC 8.3 3.5* 4.4  NEUTROABS 4.5 1.8 2.2  HGB 9.4* 8.6 Repeated and verified X2.* 8.9*  HCT 28.4* 26.5*  28.6*  MCV 89.9 90.2 96.3  PLT 76.0* 164.0 181   Basic Metabolic Panel: Recent Labs  Lab 05/09/24 1018 05/15/24 1656  NA 140 137  K 4.6 5.3*  CL 107 106  CO2 26 21*  GLUCOSE 87 152*  BUN 22 34*  CREATININE 1.58* 1.87*  CALCIUM  8.8 9.0   GFR: Estimated Creatinine Clearance: 46.6 mL/min (A) (by C-G formula based on SCr of 1.87 mg/dL (H)). Liver Function Tests: Recent Labs  Lab 05/09/24 1018 05/15/24 1656  AST 26 41  ALT 19 39  ALKPHOS 82 130*  BILITOT 0.5 0.6  PROT 7.0 7.3  ALBUMIN 4.0 3.7   No results for input(s): LIPASE, AMYLASE in the last 168 hours. No results for input(s): AMMONIA in the last 168 hours. Coagulation Profile: No results for input(s): INR, PROTIME in the last 168 hours. Cardiac Enzymes: No results for input(s): CKTOTAL, CKMB, CKMBINDEX, TROPONINI in the last 168 hours. BNP (last 3 results) No results for input(s): PROBNP in the last 8760 hours. HbA1C: No results for input(s): HGBA1C in the last 72 hours. CBG: No results for input(s): GLUCAP in the last 168 hours. Lipid Profile: No results for input(s): CHOL, HDL, LDLCALC, TRIG, CHOLHDL, LDLDIRECT in the last 72 hours. Thyroid Function Tests: No results for input(s): TSH, T4TOTAL, FREET4, T3FREE, THYROIDAB in the last 72 hours. Anemia Panel: Recent Labs    05/15/24 0759  FERRITIN 213.7  TIBC 274.4  IRON 86   Urine analysis:    Component Value Date/Time   COLORURINE YELLOW 09/05/2008 0839   APPEARANCEUR CLEAR 09/05/2008 0839   LABSPEC 1.027 09/05/2008 0839   PHURINE 5.5 09/05/2008 0839   GLUCOSEU NEGATIVE 09/05/2008 0839   HGBUR NEGATIVE 09/05/2008 0839   BILIRUBINUR NEGATIVE 09/05/2008 0839   KETONESUR NEGATIVE 09/05/2008 0839   PROTEINUR NEGATIVE 09/05/2008 0839   UROBILINOGEN 0.2 09/05/2008 0839   NITRITE NEGATIVE 09/05/2008 0839   LEUKOCYTESUR  09/05/2008 0839    NEGATIVE MICROSCOPIC NOT DONE ON URINES WITH NEGATIVE PROTEIN, BLOOD,  LEUKOCYTES, NITRITE, OR GLUCOSE <1000 mg/dL.    Radiological Exams on Admission: No results found.    Assessment/Plan    Principal Problem:   Acute upper GI bleed Active Problems:   Essential hypertension   DM2 (diabetes mellitus, type 2) (HCC)   Hyperkalemia   Acute renal failure superimposed on stage 3b chronic kidney disease (HCC)   Acute blood loss anemia       #) Acute Upper GI Bleed:  diagnosis on the basis of recent episodes of melena associated with epigastric discomfort and interval elevation in BUN  , with finding of interval decline in hemoglobin, as further quantified above.  Resolution of epigastric discomfort along with interval improvement in melena with stool returning to brown coloration, with DRE performed by EDP this evening revealing brown-colored stools fecal occult blood negative, presentation may represent a recently resolved upper gastrointestinal bleed.  He is on full dose aspirin  on daily basis, but otherwise not on any blood thinners as an outpatient.  No recent NSAID use or any known history of underlying liver disease, denies any history of alcohol  abuse.  In the absence of known liver disease, initiation of SBP prophylaxis does not appear to be warranted. Presentation and history are less suggestive of variceal bleed, and therefore there does not appear to be an indication for octreotide. Given suspected upper GI source, will continue IV Protonix .     At this time, the patient appears hemodynamically stable, with normotensive blood pressures in the absence of any associated tachycardia.  Currently asymptomatic. EDP has contacted on-call [facility] gastroenterology, Dr.Brahmbhatt, requesting consult for the morning (7/2).  No indication for PRBC transfusion at this time, but will closely monitor ensuing trend and vital signs and hemoglobin, as further outlined below.   Plan: NPO. Refraining from pharmacologic DVT prophylaxis. Monitor on telemetry.Maintain at  least 2 large bore IV's. Check INR/PTT. Q4H H&H's have been ordered through 9 AM on 7/2 . Will closely monitor these ensuing Hgb levels and correlate these data points with the patient's overall clinical picture including vital signs to determine need for ensuing transfusion. [facility]  to consult, as above. Further evaluation and management of corresponding acute blood loss anemia, including adding-on iron studies to pre-transfusion specimen, as further detailed below. CMP in the AM.  Protonix  40 mg IV twice daily.  Hold home aspirin .  Lactated Ringer 's at 100 cc/h x 10 hours.  Type and screen ordered.  Prn IV fentanyl  for any residual epigastric discomfort.                  #) Acute blood loss anemia: in the setting of suspected acute upper GI bleed, presenting Hgb noted to be 8.9, which is relative to his baseline hemoglobin range of 11-13, with hemoglobin of 11.1 noted on 04/24/2024.  There was an interval hemoglobin level checked on 05/09/2024, demonstrating a value of 9.4, which suggests potential gradual decline in hemoglobin over the, corresponding with the patient's 1 day of epigastric discomfort and melena, as above.  Hemoglobin noted to be normocytic/normothermic and associate with nonelevated RDW.  As noted above, he appears hemodynamically stable and asymptomatic at this time.  Plan: work-up and management for presenting suspected acute upper GI bleed, as above, including close monitoring of Q4H H&H's, with clinical evaluation for determination of need for blood transfusion, as further described above. Monitor on telemetry. NPO. Refraining from pharmacologic DVT prophylaxis. Check INR/PTT.  Add on the following to initial lab specimen collected in the ED today: total iron, TIBC, ferritin. [facility]  hold home gastroenterology consulted, as above.  Aspirin , as above.                   #) Acute Kidney Injury superimposed on CKD 3B:   In the context of a documented history of  CKD 3B baseline creatinine 1.4-1.6 and most recent prior creatinine did point of 1.58 on 05/09/2024, presenting creatinine is noted to be 1.87.  Suspect prerenal contribution  from relative decline in renal perfusion as a consequence of diminished oxygen delivery capacity resulting from his presenting acute blood loss anemia.   Plan: monitor strict I's & O's and daily weights. Attempt to avoid nephrotoxic agents.  Hold home metformin  and gabapentin  for now.  Refrain from NSAIDs. Repeat CMP in the morning. Check serum magnesium  level.  Check urinalysis with microscopy.  Add-on random urine sodium and random urine creatinine.  Lactated Ringer 's at 100 cc/h x 10 hours.  Further evaluation management of presenting acute upper GI bleed complicated by acute blood loss anemia, as above.                    #) Hyperkalemia: Presenting labs reflect very mild elevation of potassium, with presenting level noted to be 5.3.  Potentially multifactorial in etiology, with suspected contributions from acute kidney injury superimposed on CKD 3B as well as potential contribution from resultant relative metabolic acidosis causing extracellular potassium shift, which may have been further compounded by an element of type B lactic acidosis from metformin  as patient's renal function worsened.  Vital signs appear stable, no evidence of bradycardia.  Will check EKG at this time, provide IV fluids as well as further evaluation management of acute kidney injury and acute upper gastrointestinal bleed, as above.  Plan: Check EKG at this time.  Lactated Ringer 's at 100 cc/h x 10 hours.  Monitor strict I's and O's and daily weights.  Repeat CMP in the morning with plan for introduction of shifting medications if potassium level remains high at that time.  Monitor on telemetry.  Hold home metformin  for now.  Further evaluation management of acute kidney injury superimposed on CKD 3B, as  above.                    #) Type 2 Diabetes Mellitus: documented history of such. Home insulin  regimen: None. Home oral hypoglycemic agents: Metformin , glipizide . presenting blood sugar: 152. Most recent A1c noted to be 8.1% when checked on 04/18/2024.  His history of diabetes is complicated by diabetic peripheral polyneuropathy for which he is on gabapentin .  Plan: In the setting of current n.p.o. status, will pursue Accu-Cheks on every 6 hour basis with associated low-dose sliding scale insulin .  Hold home metformin  and glipizide  during this hospitalization.  Hold gabapentin  for now in the setting of presenting acute kidney injury as well as current n.p.o. status.                      #) Essential Hypertension: documented h/o such, with outpatient antihypertensive regimen including metoprolol  tartrate.  SBP's in the ED today: 150s mmHg.   Plan: Close monitoring of subsequent BP via routine VS. in the setting of current n.p.o. status as well as presenting acute upper gastrointestinal bleed, will hold home beta-blocker for now.  Monitor on telemetry.    DVT prophylaxis: SCD's   Code Status: Full code Family Communication: none Disposition Plan: Per Rounding Team Consults called: EDP has contacted on-call [facility] gastroenterology, Dr.Brahmbhatt, requesting consult for the morning (7/2). ;  Admission status: Observation    I SPENT GREATER THAN 75  MINUTES IN CLINICAL CARE TIME/MEDICAL DECISION-MAKING IN COMPLETING THIS ADMISSION.     Eva NOVAK Teagan Ozawa DO [facility] From 7PM - 7AM   05/15/2024, 9:57 PM

## 2024-05-15 NOTE — Telephone Encounter (Signed)
 Spoke with patient and he has agreed to go to [facility] but will not go to [facility].

## 2024-05-15 NOTE — Telephone Encounter (Signed)
 Message left for patient today to return call to clinic for lab results.

## 2024-05-15 NOTE — ED Provider Notes (Signed)
 [place] EMERGENCY DEPARTMENT AT [facility] Provider Note   CSN: [id number] Arrival date & time: 05/15/24  8366     Patient presents with: GI Bleeding   Tim Torres is a 73 y.o. male.   73 year old male with prior medical history as detailed below presents for evaluation.  Patient with noted drop in hemoglobin to 8.9.  3 weeks previously his hemoglobin was 11.1.  Patient reports several days prior he noted epigastric abdominal pain and associated dark tarry stool.  Patient without prior history of GI bleeding.  He is on aspirin  daily.  He is known to [facility].  He denies any symptoms now.  He is reports resolution of the dark stool that he had earlier.  The history is provided by the patient and medical records.       Prior to Admission medications   Medication Sig Start Date End Date Taking? Authorizing Provider  Accu-Chek Softclix Lancets lancets 3 (three) times daily. 05/02/24   [provider]  allopurinol  (ZYLOPRIM ) 300 MG tablet Take 1 tablet (300 mg total) by mouth daily. 01/23/24   Geofm Glade PARAS, MD  aspirin  325 MG tablet Take 325 mg by mouth daily.    [provider]  Blood Glucose Monitoring Suppl DEVI 1 kit by Does not apply route as directed. May substitute to any manufacturer covered by patient's insurance. 05/02/24   Geofm Glade PARAS, MD  diazepam  (VALIUM ) 5 MG tablet Take 1 tablet (5 mg total) by mouth every 12 (twelve) hours as needed for muscle spasms. 04/22/24   Bergman, Meghan D, NP  famotidine  (PEPCID ) 40 MG tablet Take 1 tablet (40 mg total) by mouth daily. 05/09/24   Geofm Glade PARAS, MD  gabapentin  (NEURONTIN ) 300 MG capsule Take 300 mg by mouth 3 (three) times daily.    [provider]  glipiZIDE  (GLUCOTROL ) 10 MG tablet Take 1 tablet (10 mg total) by mouth 2 (two) times daily before a meal. 04/22/24   Bergman, Meghan D, NP  Glucose Blood (BLOOD GLUCOSE TEST STRIPS) STRP 1 each by In Vitro route in the morning, at noon, and  at bedtime. May substitute to any manufacturer covered by patient's insurance. 05/02/24 06/01/24  Geofm Glade PARAS, MD  Lancet Device MISC 1 each by Does not apply route as directed. May substitute to any manufacturer covered by patient's insurance. 05/02/24 06/01/24  Geofm Glade PARAS, MD  lovastatin (MEVACOR) 40 MG tablet TAKE TWO TABLETS NIGHTLY 12/01/23   Wilkinson, Dana E, FNP  metFORMIN  (GLUCOPHAGE ) 1000 MG tablet Take 1 tablet (1,000 mg total) by mouth 2 (two) times daily with a meal. 04/22/24   Bergman, Meghan D, NP  metoprolol  tartrate (LOPRESSOR ) 50 MG tablet TAKE ONE TABLET TWICE DAILY 02/09/24   Geofm Glade PARAS, MD  zolpidem  (AMBIEN ) 5 MG tablet TAKE ONE TABLET AT BEDTIME AS NEEDED FOR SLEEP 04/26/24   Geofm Glade PARAS, MD    Allergies: Shellfish allergy, Atorvastatin, and Rosuvastatin    Review of Systems  All other systems reviewed and are negative.   Updated Vital Signs BP (!) 153/81 (BP Location: Left Arm)   Pulse 74   Temp (!) 97.5 F (36.4 C) (Oral)   Resp 16   Ht 6' (1.829 m)   Wt 111 kg   SpO2 98%   BMI 33.19 kg/m   Physical Exam Vitals and nursing note reviewed.  Constitutional:      General: He is not in acute distress.    Appearance: Normal appearance.  He is well-developed.  HENT:     Head: Normocephalic and atraumatic.  Eyes:     Conjunctiva/sclera: Conjunctivae normal.     Pupils: Pupils are equal, round, and reactive to light.  Cardiovascular:     Rate and Rhythm: Normal rate and regular rhythm.     Heart sounds: Normal heart sounds.  Pulmonary:     Effort: Pulmonary effort is normal. No respiratory distress.     Breath sounds: Normal breath sounds.  Abdominal:     General: There is no distension.     Palpations: Abdomen is soft.     Tenderness: There is no abdominal tenderness.  Genitourinary:    Comments: Brown stool present with DRE.  Guaiac is negative. Musculoskeletal:        General: No deformity. Normal range of motion.     Cervical back: Normal range  of motion and neck supple.  Skin:    General: Skin is warm and dry.  Neurological:     General: No focal deficit present.     Mental Status: He is alert and oriented to person, place, and time.     (all labs ordered are listed, but only abnormal results are displayed) Labs Reviewed  COMPREHENSIVE METABOLIC PANEL WITH GFR - Abnormal; Notable for the following components:      Result Value   Potassium 5.3 (*)    CO2 21 (*)    Glucose, Bld 152 (*)    BUN 34 (*)    Creatinine, Ser 1.87 (*)    Alkaline Phosphatase 130 (*)    GFR, Estimated 38 (*)    All other components within normal limits  CBC WITH DIFFERENTIAL/PLATELET - Abnormal; Notable for the following components:   RBC 2.97 (*)    Hemoglobin 8.9 (*)    HCT 28.6 (*)    All other components within normal limits  POC OCCULT BLOOD, ED    EKG: None  Radiology: No results found.   Procedures   Medications Ordered in the ED  pantoprazole  (PROTONIX ) injection 80 mg (has no administration in time range)                                    Medical Decision Making Amount and/or Complexity of Data Reviewed Labs: ordered.  Risk Prescription drug management. Decision regarding hospitalization.    Medical Screen Complete  This patient presented to the ED with complaint of decreased hemoglobin and concern for GI bleed.  This complaint involves an extensive number of treatment options. The initial differential diagnosis includes, but is not limited to, possible GI bleed  This presentation is: Acute, Chronic, Self-Limited, Previously Undiagnosed, Uncertain Prognosis, Complicated, Systemic Symptoms, and Threat to Life/Bodily Function  Patient presents with transient reported epigastric abdominal pain and transient reported dark stool.  Patient with noted drop in hemoglobin to 8.9 from 11.1.  Known to [facility].  Patient is on aspirin .  Exam is reassuring with guaiac negative brown stool present.  Patient with stable  hemodynamics.  Patient would benefit from observation overnight.  Hospitalist service is aware of case.  Dr. Elicia with GI is aware of case and will consult.    Additional history obtained:  External records from outside sources obtained and reviewed including prior ED visits and prior Inpatient records.    Problem List / ED Course:  Anemia, suspected GI bleed   Disposition:  After consideration of the diagnostic results and  the patients response to treatment, I feel that the patent would benefit from admission.         Final diagnoses:  Anemia, unspecified type    ED Discharge Orders     None          Laurice Maude BROCKS, MD 05/15/24 2241

## 2024-05-15 NOTE — ED Triage Notes (Signed)
 Pt sent over by provider note states  Patient advised he has GI bleed and instructed to go to the ER.  Hemoglobin has dropped  No change in stool color

## 2024-05-16 ENCOUNTER — Other Ambulatory Visit: Payer: Self-pay

## 2024-05-16 ENCOUNTER — Telehealth: Payer: Self-pay | Admitting: Internal Medicine

## 2024-05-16 ENCOUNTER — Encounter (HOSPITAL_COMMUNITY)
Admission: EM | Disposition: A | Discharge status: Home / Self Care | Payer: Self-pay | Source: Ambulatory Visit | Attending: Emergency Medicine

## 2024-05-16 ENCOUNTER — Observation Stay (HOSPITAL_COMMUNITY): Admitting: Anesthesiology

## 2024-05-16 DIAGNOSIS — I2510 Atherosclerotic heart disease of native coronary artery without angina pectoris: Secondary | ICD-10-CM

## 2024-05-16 DIAGNOSIS — K2950 Unspecified chronic gastritis without bleeding: Secondary | ICD-10-CM | POA: Diagnosis not present

## 2024-05-16 DIAGNOSIS — D62 Acute posthemorrhagic anemia: Secondary | ICD-10-CM

## 2024-05-16 DIAGNOSIS — K259 Gastric ulcer, unspecified as acute or chronic, without hemorrhage or perforation: Secondary | ICD-10-CM | POA: Diagnosis not present

## 2024-05-16 DIAGNOSIS — I129 Hypertensive chronic kidney disease with stage 1 through stage 4 chronic kidney disease, or unspecified chronic kidney disease: Secondary | ICD-10-CM | POA: Diagnosis not present

## 2024-05-16 DIAGNOSIS — K922 Gastrointestinal hemorrhage, unspecified: Secondary | ICD-10-CM | POA: Diagnosis not present

## 2024-05-16 DIAGNOSIS — K3189 Other diseases of stomach and duodenum: Secondary | ICD-10-CM | POA: Diagnosis not present

## 2024-05-16 DIAGNOSIS — K2970 Gastritis, unspecified, without bleeding: Secondary | ICD-10-CM | POA: Diagnosis not present

## 2024-05-16 DIAGNOSIS — N1832 Chronic kidney disease, stage 3b: Secondary | ICD-10-CM | POA: Diagnosis not present

## 2024-05-16 DIAGNOSIS — K921 Melena: Secondary | ICD-10-CM | POA: Diagnosis not present

## 2024-05-16 LAB — COMPREHENSIVE METABOLIC PANEL WITH GFR
ALT: 31 U/L (ref 0–44)
AST: 29 U/L (ref 15–41)
Albumin: 3.1 g/dL — ABNORMAL LOW (ref 3.5–5.0)
Alkaline Phosphatase: 111 U/L (ref 38–126)
Anion gap: 6 (ref 5–15)
BUN: 31 mg/dL — ABNORMAL HIGH (ref 8–23)
CO2: 22 mmol/L (ref 22–32)
Calcium: 8.5 mg/dL — ABNORMAL LOW (ref 8.9–10.3)
Chloride: 109 mmol/L (ref 98–111)
Creatinine, Ser: 1.65 mg/dL — ABNORMAL HIGH (ref 0.61–1.24)
GFR, Estimated: 44 mL/min — ABNORMAL LOW (ref >60)
Glucose, Bld: 131 mg/dL — ABNORMAL HIGH (ref 70–99)
Potassium: 4.8 mmol/L (ref 3.5–5.1)
Sodium: 137 mmol/L (ref 135–145)
Total Bilirubin: 0.8 mg/dL (ref 0.0–1.2)
Total Protein: 6.5 g/dL (ref 6.5–8.1)

## 2024-05-16 LAB — HEMOGLOBIN AND HEMATOCRIT, BLOOD
HCT: 24.7 % — ABNORMAL LOW (ref 39.0–52.0)
HCT: 25.4 % — ABNORMAL LOW (ref 39.0–52.0)
Hemoglobin: 7.6 g/dL — ABNORMAL LOW (ref 13.0–17.0)
Hemoglobin: 7.7 g/dL — ABNORMAL LOW (ref 13.0–17.0)

## 2024-05-16 LAB — URINALYSIS, COMPLETE (UACMP) WITH MICROSCOPIC
Bacteria, UA: NONE SEEN
Bilirubin Urine: NEGATIVE
Glucose, UA: NEGATIVE mg/dL
Ketones, ur: NEGATIVE mg/dL
Leukocytes,Ua: NEGATIVE
Nitrite: NEGATIVE
Protein, ur: NEGATIVE mg/dL
Specific Gravity, Urine: 1.014 (ref 1.005–1.030)
pH: 5 (ref 5.0–8.0)

## 2024-05-16 LAB — CBC WITH DIFFERENTIAL/PLATELET
Abs Immature Granulocytes: 0.03 10*3/uL (ref 0.00–0.07)
Basophils Absolute: 0 10*3/uL (ref 0.0–0.1)
Basophils Relative: 1 %
Eosinophils Absolute: 0.3 10*3/uL (ref 0.0–0.5)
Eosinophils Relative: 7 %
HCT: 24.8 % — ABNORMAL LOW (ref 39.0–52.0)
Hemoglobin: 7.7 g/dL — ABNORMAL LOW (ref 13.0–17.0)
Immature Granulocytes: 1 %
Lymphocytes Relative: 30 %
Lymphs Abs: 1.3 10*3/uL (ref 0.7–4.0)
MCH: 29.5 pg (ref 26.0–34.0)
MCHC: 31 g/dL (ref 30.0–36.0)
MCV: 95 fL (ref 80.0–100.0)
Monocytes Absolute: 0.4 10*3/uL (ref 0.1–1.0)
Monocytes Relative: 10 %
Neutro Abs: 2.3 10*3/uL (ref 1.7–7.7)
Neutrophils Relative %: 51 %
Platelets: 167 10*3/uL (ref 150–400)
RBC: 2.61 MIL/uL — ABNORMAL LOW (ref 4.22–5.81)
RDW: 15 % (ref 11.5–15.5)
WBC: 4.3 10*3/uL (ref 4.0–10.5)
nRBC: 0 % (ref 0.0–0.2)

## 2024-05-16 LAB — MAGNESIUM: Magnesium: 1.7 mg/dL (ref 1.7–2.4)

## 2024-05-16 LAB — CBG MONITORING, ED
Glucose-Capillary: 123 mg/dL — ABNORMAL HIGH (ref 70–99)
Glucose-Capillary: 135 mg/dL — ABNORMAL HIGH (ref 70–99)

## 2024-05-16 LAB — GLUCOSE, CAPILLARY: Glucose-Capillary: 129 mg/dL — ABNORMAL HIGH (ref 70–99)

## 2024-05-16 LAB — TYPE AND SCREEN
ABO/RH(D): A POS
Antibody Screen: NEGATIVE

## 2024-05-16 SURGERY — EGD (ESOPHAGOGASTRODUODENOSCOPY)
Anesthesia: Monitor Anesthesia Care

## 2024-05-16 MED ORDER — PANTOPRAZOLE SODIUM 40 MG PO TBEC: 40.0000 mg | DELAYED_RELEASE_TABLET | Freq: Two times a day (BID) | ORAL | 0 refills | Status: AC

## 2024-05-16 MED ORDER — SODIUM CHLORIDE 0.9 % IV SOLN: INTRAVENOUS | Status: DC | PRN

## 2024-05-16 MED ORDER — PROPOFOL 500 MG/50ML IV EMUL
INTRAVENOUS | Status: DC | PRN
  Administered 2024-05-16: 180 ug/kg/min via INTRAVENOUS

## 2024-05-16 MED ORDER — PROPOFOL 10 MG/ML IV BOLUS
INTRAVENOUS | Status: DC | PRN
  Administered 2024-05-16 (×2): 20 mg via INTRAVENOUS
  Administered 2024-05-16: 40 mg via INTRAVENOUS

## 2024-05-16 MED ORDER — LIDOCAINE 2% (20 MG/ML) 5 ML SYRINGE
INTRAMUSCULAR | Status: DC | PRN
  Administered 2024-05-16: 40 mg via INTRAVENOUS

## 2024-05-16 NOTE — Discharge Summary (Signed)
 Physician Discharge Summary   Patient: Tim Torres MRN: [id number] DOB: [birth year]  Admit date:     05/15/2024  Discharge date: 05/16/2024  Discharge Physician: Tim Lam, MD   PCP: Tim Glade PARAS, MD   Recommendations at discharge:  PCP visit for hospital follow-up Repeat CBC in 1-3 days to ensure stable hemoglobin (PCP's office called and informed of recommendation) GI visit for hospital follow-up and biopsy results  Discharge Diagnoses: Principal Problem:   Acute upper GI bleed Active Problems:   Essential hypertension   DM2 (diabetes mellitus, type 2) (HCC)   Hyperkalemia   Acute renal failure superimposed on stage 3b chronic kidney disease (HCC)   Acute blood loss anemia  Resolved Problems:   * No resolved hospital problems. *  Hospital Course: Tim Torres is a 73 y.o. male with a history of diabetes mellitus type 2, hypertension, CKD.  Patient presented secondary to low hemoglobin and found to have acute blood loss from an acute upper GI bleed. GI consulted and performed an upper GI endoscopy, revealing gastritis and duodenitis; biopsy obtained. Discharge with Protonix .  Assessment and Plan:  Acute upper GI bleed Melena Present on admission. GI consulted and performed an upper GI endoscopy, significant for gastritis and duodenitis. Biopsy obtained. Patient discharged on Protonix . Per GI, okay to resume aspirin .  Acute blood loss anemia Baseline hemoglobin of about 11, with recent drop to 8.9. Hemoglobin of 8.6 on admission with downtrend to 7.6 without continued melena.  AKI on CKD stage IIIb Baseline creatinine of 1.4-1.6. Creatinine of 1.87 on admission with improvement to 1.65, with IV fluids, prior to discharge. AKI resolved.  Hyperkalemia Potassium of 5.3, likely related to AKI on CKD. Patient managed with IV fluids.  Diabetes mellitus type 2 Uncontrolled based on hemoglobin A1C of 8.1%. Continue metformin  and glipizide  on discharge.  Primary  hypertension Continue metoprolol .  Hyperlipidemia Continue lovastatin   Consultants: Gastroenterology Procedures performed: Upper GI endoscopy  Disposition: Home Diet recommendation: Carb modified diet   DISCHARGE MEDICATION: Allergies as of 05/16/2024       Reactions   Shellfish Allergy Shortness Of Breath, Swelling   Atorvastatin Other (See Comments)   Myalgia    Carafate [sucralfate] Other (See Comments)   Made the stomach hurt worse.   Rosuvastatin Other (See Comments)   Myalgia        Medication List     PAUSE taking these medications    famotidine  40 MG tablet Wait to take this until your doctor or other care provider tells you to start again. Commonly known as: PEPCID  Take 1 tablet (40 mg total) by mouth daily. What changed: when to take this       STOP taking these medications    diazepam  5 MG tablet Commonly known as: VALIUM        TAKE these medications    Accu-Chek Softclix Lancets lancets 3 (three) times daily.   allopurinol  300 MG tablet Commonly known as: ZYLOPRIM  Take 1 tablet (300 mg total) by mouth daily.   aspirin  325 MG tablet Take 325 mg by mouth daily.   Blood Glucose Monitoring Suppl Devi 1 kit by Does not apply route as directed. May substitute to any manufacturer covered by patient's insurance.   BLOOD GLUCOSE TEST STRIPS Strp 1 each by In Vitro route in the morning, at noon, and at bedtime. May substitute to any manufacturer covered by patient's insurance.   cyclobenzaprine  10 MG tablet Commonly known as: FLEXERIL  Take 10 mg by mouth 3 (  three) times daily as needed for muscle spasms.   glipiZIDE  10 MG tablet Commonly known as: GLUCOTROL  Take 1 tablet (10 mg total) by mouth 2 (two) times daily before a meal.   Lancet Device Misc 1 each by Does not apply route as directed. May substitute to any manufacturer covered by patient's insurance.   lovastatin 40 MG tablet Commonly known as: MEVACOR TAKE TWO TABLETS  NIGHTLY What changed: See the new instructions.   metFORMIN  1000 MG tablet Commonly known as: GLUCOPHAGE  Take 1 tablet (1,000 mg total) by mouth 2 (two) times daily with a meal. What changed: when to take this   metoprolol  tartrate 50 MG tablet Commonly known as: LOPRESSOR  TAKE ONE TABLET TWICE DAILY What changed:  when to take this additional instructions   pantoprazole  40 MG tablet Commonly known as: Protonix  Take 1 tablet (40 mg total) by mouth 2 (two) times daily.   TYLENOL  500 MG tablet Generic drug: acetaminophen  Take 1,000 mg by mouth every 6 (six) hours as needed for mild pain (pain score 1-3) or headache.   zolpidem  5 MG tablet Commonly known as: AMBIEN  TAKE ONE TABLET AT BEDTIME AS NEEDED FOR SLEEP What changed: See the new instructions.        Follow-up Information     Tim Kitchens, MD Follow up in 4 day(s).   Specialty: Gastroenterology Why: Please call to schedule an appointment with our office after discharge from hospital. Contact information: [address] [place] [address] [phone]         Tim Glade PARAS, MD. Schedule an appointment as soon as possible for a visit in 2 day(s).   Specialty: Internal Medicine Why: Repeat CBC or hemoglobin and hematocrit. Contact information: [address] [place] [address] [phone]                Discharge Exam: BP 138/81   Pulse 79   Temp 98.1 F (36.7 C)   Resp 14   Ht 6' (1.829 m)   Wt 111 kg   SpO2 96%   BMI 33.19 kg/m   General exam: Appears calm and comfortable Respiratory system: Clear to auscultation. Respiratory effort normal. Cardiovascular system: S1 & S2 heard, RRR. No murmurs. Gastrointestinal system: Abdomen is nondistended, soft and nontender. Normal bowel sounds heard. Central nervous system: Alert and oriented. No focal neurological deficits. Psychiatry: Judgement and insight appear normal. Mood & affect appropriate.   Condition at  discharge: stable  The results of significant diagnostics from this hospitalization (including imaging, microbiology, ancillary and laboratory) are listed below for reference.   Imaging Studies: NM Hepato W/EF Result Date: 04/18/2024 CLINICAL DATA:  Cholelithiasis EXAM: NUCLEAR MEDICINE HEPATOBILIARY IMAGING WITH GALLBLADDER EF TECHNIQUE: Sequential images of the abdomen were obtained out to 60 minutes following intravenous administration of radiopharmaceutical. After oral ingestion of Ensure, gallbladder ejection fraction was determined. At 60 min, normal ejection fraction is greater than 33%. RADIOPHARMACEUTICALS:  5.0 mCi Tc-62m  Choletec  IV COMPARISON:  04/18/2024 ultrasound FINDINGS: Prompt uptake and biliary excretion of activity by the liver is seen. Gallbladder activity is visualized, consistent with patency of cystic duct. Biliary activity passes into small bowel, consistent with patent common bile duct. Calculated gallbladder ejection fraction is 93%. (Normal gallbladder ejection fraction with Ensure is greater than 33% and less than 80%.) IMPRESSION: 1. No evidence of biliary obstruction or acute cholecystitis. 2. Slightly elevated gallbladder ejection fraction after fatty meal, a nonspecific finding that could be related to biliary hyperkinesia. Electronically  Signed   By: Ozell Daring M.D.   On: 04/18/2024 18:08   US  Abdomen Limited RUQ (LIVER/GB) Result Date: 04/18/2024 CLINICAL DATA:  Transaminitis. EXAM: ULTRASOUND ABDOMEN LIMITED RIGHT UPPER QUADRANT COMPARISON:  None Available. FINDINGS: Gallbladder: A 2.3 cm shadowing echogenic gallstone is seen within the neck of the gallbladder. The gallbladder wall measures 4.3 mm in thickness. The presence or absence of a sonographic Beverley sign was not documented by the sonographer. Common bile duct: Diameter: 2.1 mm Liver: No focal lesion identified. Within normal limits in parenchymal echogenicity. Portal vein is patent on color Doppler imaging  with normal direction of blood flow towards the liver. Other: None. IMPRESSION: 1. Cholelithiasis and mild gallbladder wall thickening without additional findings to suggest the presence of acute cholecystitis. Further evaluation with a nuclear medicine hepatobiliary scan is recommended if this is of clinical concern. Electronically Signed   By: Suzen Dials M.D.   On: 04/18/2024 02:09   CT KNEE RIGHT W CONTRAST Result Date: 04/17/2024 CLINICAL DATA:  Soft tissue infection suspected. Postop right peroneal nerve decompression 03/30/2024 with increased pain and drainage from the wound 5 days after surgery. Response to antibiotics. EXAM: CT OF THE RIGHT KNEE WITH CONTRAST TECHNIQUE: Multidetector CT imaging was performed following the standard protocol during bolus administration of intravenous contrast. RADIATION DOSE REDUCTION: This exam was performed according to the departmental dose-optimization program which includes automated exposure control, adjustment of the mA and/or kV according to patient size and/or use of iterative reconstruction technique. CONTRAST:  75mL OMNIPAQUE  IOHEXOL  350 MG/ML SOLN COMPARISON:  Ultrasound 04/12/2024. FINDINGS: Bones/Joint/Cartilage No evidence of acute fracture, dislocation or osteomyelitis. There are moderate medial and mild patellofemoral and lateral compartment degenerative changes for age. There is nonspecific moderate-sized knee joint effusion with mild synovial enhancement. Ligaments Suboptimally assessed by CT. The cruciate ligaments are grossly intact. There is some lateral meniscal chondrocalcinosis. Muscles and Tendons Intact extensor mechanism. There is mild spurring at the quadriceps insertion on the patella. No focal muscular abnormalities are identified surrounding the knee. Soft tissues Surgical wound noted lateral and proximal to the fibular head with ill-defined increased density within the underlying subcutaneous fat measuring up to 2.5 x 1.4 cm on axial  image 77/4. There is inferior extension of subfascial fluid along the superficial aspect of the peroneal muscles, measuring up to 3.2 x 1.2 cm transverse and extending approximately 6.7 cm in length. This fluid is not associated with any suspicious peripheral enhancement. No organized fluid collection, unexpected foreign body or soft tissue emphysema. Prominent vascular calcifications are noted without evidence of large vessel occlusion. IMPRESSION: 1. Surgical wound lateral and proximal to the fibular head with ill-defined increased density within the underlying subcutaneous fat, likely postoperative edema. 2. Inferior extension of subfascial fluid along the superficial aspect of the peroneal muscles without suspicious peripheral enhancement, likely postoperative seroma. 3. No organized fluid collection, unexpected foreign body or soft tissue emphysema. 4. Nonspecific moderate-sized knee joint effusion with mild synovial enhancement, likely reactive. 5. No evidence of osteomyelitis. 6. Moderate medial and mild patellofemoral and lateral compartment degenerative changes for age. Electronically Signed   By: Elsie Perone M.D.   On: 04/17/2024 11:14    Microbiology: Results for orders placed or performed during the hospital encounter of 04/12/24  Culture, blood (Routine X 2) w Reflex to ID Panel     Status: None   Collection Time: 04/12/24  6:10 PM   Specimen: BLOOD  Result Value Ref Range Status   Specimen Description BLOOD LEFT  ANTECUBITAL  Final   Special Requests   Final    BOTTLES DRAWN AEROBIC AND ANAEROBIC Blood Culture results may not be optimal due to an inadequate volume of blood received in culture bottles   Culture   Final    NO GROWTH 5 DAYS Performed at [facility], [address], [place], [place] [address]    Report Status 04/17/2024 FINAL  Final  Culture, blood (Routine X 2) w Reflex to ID Panel     Status: Abnormal   Collection Time: 04/12/24  6:10 PM   Specimen: BLOOD   Result Value Ref Range Status   Specimen Description BLOOD RIGHT ANTECUBITAL  Final   Special Requests   Final    BOTTLES DRAWN AEROBIC AND ANAEROBIC Blood Culture results may not be optimal due to an inadequate volume of blood received in culture bottles   Culture  Setup Time   Final    GRAM POSITIVE COCCI ANAEROBIC BOTTLE ONLY CRITICAL RESULT CALLED TO, READ BACK BY AND VERIFIED WITH: PHARMD MICHAEL BITONTI [id number] AT 2030 BY EC    Culture (A)  Final    STAPHYLOCOCCUS CAPITIS THE SIGNIFICANCE OF ISOLATING THIS ORGANISM FROM A SINGLE SET OF BLOOD CULTURES WHEN MULTIPLE SETS ARE DRAWN IS UNCERTAIN. PLEASE NOTIFY THE MICROBIOLOGY DEPARTMENT WITHIN ONE WEEK IF SPECIATION AND SENSITIVITIES ARE REQUIRED. Performed at [facility], [address], [place], [place] [address]    Report Status 04/16/2024 FINAL  Final  Blood Culture ID Panel (Reflexed)     Status: Abnormal   Collection Time: 04/12/24  6:10 PM  Result Value Ref Range Status   Enterococcus faecalis NOT DETECTED NOT DETECTED Final   Enterococcus Faecium NOT DETECTED NOT DETECTED Final   Listeria monocytogenes NOT DETECTED NOT DETECTED Final   Staphylococcus species DETECTED (A) NOT DETECTED Final    Comment: CRITICAL RESULT CALLED TO, READ BACK BY AND VERIFIED WITH: PHARMD MICHAEL BITONTI [id number] AT 2030 BY EC    Staphylococcus aureus (BCID) NOT DETECTED NOT DETECTED Final   Staphylococcus epidermidis NOT DETECTED NOT DETECTED Final   Staphylococcus lugdunensis NOT DETECTED NOT DETECTED Final   Streptococcus species NOT DETECTED NOT DETECTED Final   Streptococcus agalactiae NOT DETECTED NOT DETECTED Final   Streptococcus pneumoniae NOT DETECTED NOT DETECTED Final   Streptococcus pyogenes NOT DETECTED NOT DETECTED Final   A.calcoaceticus-baumannii NOT DETECTED NOT DETECTED Final   Bacteroides fragilis NOT DETECTED NOT DETECTED Final   Enterobacterales NOT DETECTED NOT DETECTED Final   Enterobacter cloacae complex NOT  DETECTED NOT DETECTED Final   Escherichia coli NOT DETECTED NOT DETECTED Final   Klebsiella aerogenes NOT DETECTED NOT DETECTED Final   Klebsiella oxytoca NOT DETECTED NOT DETECTED Final   Klebsiella pneumoniae NOT DETECTED NOT DETECTED Final   Proteus species NOT DETECTED NOT DETECTED Final   Salmonella species NOT DETECTED NOT DETECTED Final   Serratia marcescens NOT DETECTED NOT DETECTED Final   Haemophilus influenzae NOT DETECTED NOT DETECTED Final   Neisseria meningitidis NOT DETECTED NOT DETECTED Final   Pseudomonas aeruginosa NOT DETECTED NOT DETECTED Final   Stenotrophomonas maltophilia NOT DETECTED NOT DETECTED Final   Candida albicans NOT DETECTED NOT DETECTED Final   Candida auris NOT DETECTED NOT DETECTED Final   Candida glabrata NOT DETECTED NOT DETECTED Final   Candida krusei NOT DETECTED NOT DETECTED Final   Candida parapsilosis NOT DETECTED NOT DETECTED Final   Candida tropicalis NOT DETECTED NOT DETECTED Final   Cryptococcus neoformans/gattii NOT DETECTED NOT DETECTED Final  Comment: Performed at [facility], [address] [place], [place] [address]    Labs: CBC: Recent Labs  Lab 05/15/24 0759 05/15/24 1656 05/16/24 0035 05/16/24 0500 05/16/24 1302  WBC 3.5* 4.4  --  4.3  --   NEUTROABS 1.8 2.2  --  2.3  --   HGB 8.6 Repeated and verified X2.* 8.9* 7.7* 7.7* 7.6*  HCT 26.5* 28.6* 25.4* 24.8* 24.7*  MCV 90.2 96.3  --  95.0  --   PLT 164.0 181  --  167  --    Basic Metabolic Panel: Recent Labs  Lab 05/15/24 1656 05/16/24 0500  NA 137 137  K 5.3* 4.8  CL 106 109  CO2 21* 22  GLUCOSE 152* 131*  BUN 34* 31*  CREATININE 1.87* 1.65*  CALCIUM  9.0 8.5*  MG  --  1.7   Liver Function Tests: Recent Labs  Lab 05/15/24 1656 05/16/24 0500  AST 41 29  ALT 39 31  ALKPHOS 130* 111  BILITOT 0.6 0.8  PROT 7.3 6.5  ALBUMIN 3.7 3.1*   CBG: Recent Labs  Lab 05/16/24 0033 05/16/24 0522 05/16/24 1038  GLUCAP 123* 135* 129*    Discharge time  spent: 35 minutes.  Signed: Elgin Lam, MD [facility] 05/16/2024

## 2024-05-16 NOTE — H&P (View-Only) (Signed)
 [facility] Gastroenterology Consult  Referring Provider: No ref. provider found Primary Care Physician:  Geofm Glade PARAS, MD Primary Gastroenterologist: [facility]  Reason for Consultation: Melena, anemia  SUBJECTIVE:   HPI: Tim Torres is a 73 y.o. male with past medical history significant for type 2 diabetes mellitus, hypertension, chronic kidney disease.  Admitted from 04/12/2024 - 04/22/2024 for postoperative wound infection.  He underwent right peroneal nerve decompression on 03/30/2024 though began to have pain and drainage from wound, he required hospitalization for IV antibiotic therapy, was discharged on oral Levaquin  and linezolid  for 14 days.  Patient noted that he has been experiencing intermittent black-colored stools since he has been on antibiotic therapy.  He has been experiencing some epigastric abdominal discomfort which has since resolved.  He has completed antibiotic therapy has not seen further melena.  He takes a daily full dose aspirin .  No chest pain or shortness of breath.  He has previously been seen in [facility] office for chronic diarrhea, he had colonoscopy completed with Dr. Rosalie on 11/14/2023 which showed focal active colitis on biopsy, tubular adenoma x 1 in the cecum.  Recommended have repeat colonoscopy in 5 years.  At this time, patient noted that he had some loose stools with antibiotic therapy, though prior to that time his diarrhea had resolved.  He denied NSAID use.  Noted that he takes famotidine  daily.  No current PPI use.  In workup of his abdominal pain he had abdominal ultrasound completed on 04/18/2024 which showed common bile duct 2.1 mm, 2.3 cm cholelithiasis, mild gallbladder wall thickening.  He subsequently had HIDA scan completed on 04/18/2024 which showed ejection fraction of 93%, no biliary obstruction or acute cholecystitis.  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 Authorizing Provider  allopurinol  (ZYLOPRIM ) 300 MG tablet Take 1 tablet (300 mg total) by mouth daily. 01/23/24  Yes Burns, Glade PARAS, MD  aspirin  325 MG tablet Take 325 mg by mouth daily.   Yes [provider]  cyclobenzaprine  (FLEXERIL ) 10 MG tablet Take 10 mg by mouth 3 (three) times daily as needed for muscle spasms.   Yes [provider]  famotidine  (PEPCID ) 40 MG tablet Take 1 tablet (40 mg total) by mouth daily. Patient taking differently: Take 40 mg by mouth daily before breakfast. 05/09/24  Yes Burns, Glade PARAS, MD  glipiZIDE  (GLUCOTROL ) 10 MG tablet Take 1 tablet (10 mg total) by mouth 2 (two) times daily before a meal. 04/22/24  Yes Bergman, Meghan D, NP  lovastatin (MEVACOR) 40 MG tablet TAKE TWO TABLETS NIGHTLY Patient taking differently: Take 40 mg by mouth See admin instructions. Take 40 mg by mouth in the morning and evening 12/01/23  Yes Wilkinson, Dana E, FNP  metFORMIN  (GLUCOPHAGE ) 1000 MG tablet Take  1 tablet (1,000 mg total) by mouth 2 (two) times daily with a meal. Patient taking differently: Take 1,000 mg by mouth daily with breakfast. 04/22/24  Yes Bergman, Meghan D, NP  metoprolol  tartrate (LOPRESSOR ) 50 MG tablet TAKE ONE TABLET TWICE DAILY Patient taking differently: Take 50 mg by mouth See admin instructions. Take 50 mg by mouth in the morning and evening 02/09/24  Yes Burns, Glade PARAS, MD  TYLENOL  500  MG tablet Take 1,000 mg by mouth every 6 (six) hours as needed for mild pain (pain score 1-3) or headache.   Yes [provider]  zolpidem  (AMBIEN ) 5 MG tablet TAKE ONE TABLET AT BEDTIME AS NEEDED FOR SLEEP Patient taking differently: Take 5 mg by mouth at bedtime. 04/26/24  Yes Burns, Glade PARAS, MD  Accu-Chek Softclix Lancets lancets 3 (three) times daily. 05/02/24   [provider]  Blood Glucose Monitoring Suppl DEVI 1 kit by Does not apply route as directed. May substitute to any manufacturer covered by patient's insurance. 05/02/24   Geofm Glade PARAS, MD  diazepam  (VALIUM ) 5 MG tablet Take 1 tablet (5 mg total) by mouth every 12 (twelve) hours as needed for muscle spasms. Patient not taking: Reported on 05/15/2024 04/22/24   Bergman, Meghan D, NP  Glucose Blood (BLOOD GLUCOSE TEST STRIPS) STRP 1 each by In Vitro route in the morning, at noon, and at bedtime. May substitute to any manufacturer covered by patient's insurance. 05/02/24 06/01/24  Geofm Glade PARAS, MD  Lancet Device MISC 1 each by Does not apply route as directed. May substitute to any manufacturer covered by patient's insurance. 05/02/24 06/01/24  Geofm Glade PARAS, MD   Current Facility-Administered Medications  Medication Dose Route Frequency Provider Last Rate Last Admin   [MAR Hold] acetaminophen  (TYLENOL ) tablet 650 mg  650 mg Oral Q6H PRN Howerter, Justin B, DO       Or   [MAR Hold] acetaminophen  (TYLENOL ) suppository 650 mg  650 mg Rectal Q6H PRN Howerter, Justin B, DO       [MAR Hold] dextrose  50 % solution 50 mL  1 ampule Intravenous Q1H PRN Howerter, Justin B, DO       [MAR Hold] fentaNYL  (SUBLIMAZE ) injection 25 mcg  25 mcg Intravenous Q2H PRN Howerter, Justin B, DO       [MAR Hold] insulin  aspart (novoLOG ) injection 0-6 Units  0-6 Units Subcutaneous Q6H Howerter, Justin B, DO       [MAR Hold] naloxone (NARCAN) injection 0.4 mg  0.4 mg Intravenous PRN Howerter, Justin B, DO       [MAR Hold] ondansetron  (ZOFRAN ) injection  4 mg  4 mg Intravenous Q6H PRN Howerter, Justin B, DO       [MAR Hold] pantoprazole  (PROTONIX ) injection 40 mg  40 mg Intravenous Q12H Howerter, Justin B, DO   40 mg at 05/16/24 9180   Allergies as of 05/15/2024 - Review Complete 05/15/2024  Allergen Reaction Noted   Shellfish allergy Shortness Of Breath and Swelling 07/07/2012   Atorvastatin Other (See Comments) 12/15/2016   Carafate [sucralfate] Other (See Comments) 05/15/2024   Rosuvastatin Other (See Comments) 12/15/2016   History reviewed. No pertinent family history. Social History   Socioeconomic History   Marital status: Significant Other    Spouse name: Not on file   Number of children: Not on file   Years of education: Not on file   Highest education level: Not on file  Occupational History   Not on file  Tobacco Use  Smoking status: Former    Current packs/day: 0.00    Types: Cigarettes    Quit date: 10/15/1982    Years since quitting: 41.6   Smokeless tobacco: Never  Vaping Use   Vaping status: Never Used  Substance and Sexual Activity   Alcohol  use: Yes    Alcohol /week: 1.0 standard drink of alcohol     Types: 1 Cans of beer per week    Comment: occasionallyy   Drug use: Never   Sexual activity: Not on file  Other Topics Concern   Not on file  Social History Narrative   Not on file   Social Drivers of Health   Financial Resource Strain: Low Risk  (04/16/2024)   Overall Financial Resource Strain (CARDIA)    Difficulty of Paying Living Expenses: Not hard at all  Food Insecurity: No Food Insecurity (04/12/2024)   Hunger Vital Sign    Worried About Running Out of Food in the Last Year: Never true    Ran Out of Food in the Last Year: Never true  Transportation Needs: No Transportation Needs (04/13/2024)   PRAPARE - Administrator, Civil Service (Medical): No    Lack of Transportation (Non-Medical): No  Physical Activity: Inactive (04/16/2024)   Exercise Vital Sign    Days of Exercise per Week: 0  days    Minutes of Exercise per Session: 0 min  Stress: No Stress Concern Present (04/16/2024)   [employer] of Occupational Health - Occupational Stress Questionnaire    Feeling of Stress : Not at all  Social Connections: Socially Integrated (04/13/2024)   Social Connection and Isolation Panel    Frequency of Communication with Friends and Family: Three times a week    Frequency of Social Gatherings with Friends and Family: Three times a week    Attends Religious Services: More than 4 times per year    Active Member of Clubs or Organizations: Yes    Attends Banker Meetings: More than 4 times per year    Marital Status: Married  Catering manager Violence: Not At Risk (04/12/2024)   Humiliation, Afraid, Rape, and Kick questionnaire    Fear of Current or Ex-Partner: No    Emotionally Abused: No    Physically Abused: No    Sexually Abused: No   Review of Systems:  Review of Systems  Respiratory:  Negative for shortness of breath.   Cardiovascular:  Negative for chest pain.  Gastrointestinal:  Positive for abdominal pain, diarrhea and melena.    OBJECTIVE:   Temp:  [97.5 F (36.4 C)-97.9 F (36.6 C)] 97.8 F (36.6 C) (07/02 0920) Pulse Rate:  [64-88] 79 (07/02 1020) Resp:  [14-21] 21 (07/02 1020) BP: (126-170)/(62-105) 159/91 (07/02 1020) SpO2:  [93 %-100 %] 97 % (07/02 1020) Weight:  [888 kg] 111 kg (07/01 1644)   Physical Exam Constitutional:      General: He is not in acute distress.    Appearance: He is not ill-appearing, toxic-appearing or diaphoretic.  Cardiovascular:     Rate and Rhythm: Normal rate and regular rhythm.  Pulmonary:     Effort: No respiratory distress.     Breath sounds: Normal breath sounds.  Abdominal:     General: Bowel sounds are normal. There is no distension.     Palpations: Abdomen is soft.     Tenderness: There is no abdominal tenderness. There is no guarding.  Musculoskeletal:     Right lower leg: Edema present.     Left  lower  leg: No edema.  Skin:    General: Skin is warm and dry.  Neurological:     Mental Status: He is alert.     Labs: Recent Labs    05/15/24 0759 05/15/24 1656 05/16/24 0035 05/16/24 0500  WBC 3.5* 4.4  --  4.3  HGB 8.6 Repeated and verified X2.* 8.9* 7.7* 7.7*  HCT 26.5* 28.6* 25.4* 24.8*  PLT 164.0 181  --  167   BMET Recent Labs    05/15/24 1656 05/16/24 0500  NA 137 137  K 5.3* 4.8  CL 106 109  CO2 21* 22  GLUCOSE 152* 131*  BUN 34* 31*  CREATININE 1.87* 1.65*  CALCIUM  9.0 8.5*   LFT Recent Labs    05/16/24 0500  PROT 6.5  ALBUMIN 3.1*  AST 29  ALT 31  ALKPHOS 111  BILITOT 0.8   PT/INR No results for input(s): LABPROT, INR in the last 72 hours.  Diagnostic imaging: No results found.  IMPRESSION: Melena Acute blood loss anemia Postoperative wound infection Coronary artery disease on full dose aspirin  History myocardial infarction Type 2 diabetes mellitus Hypertension  PLAN: -Recommend EGD to further evaluate melena and acute blood loss anemia, discussed procedure with patient including benefits, alternatives and risks of bleeding/infection/perforation/anesthesia, he verbalized understanding elected to proceed -Maintain n.p.o. for procedure -Further recommendations to follow pending procedure   LOS: 0 days   Estefana Keas, DO [facility] Gastroenterology

## 2024-05-16 NOTE — Op Note (Signed)
 [facility] Patient Name: Tim Torres Procedure Date: 05/16/2024 MRN: [id number] Attending MD: Estefana Keas DO, DO, [id number] Date of Birth: 20-Apr-1951 CSN: [id number] Age: 73 Admit Type: Inpatient Procedure:                Upper GI endoscopy Indications:              Acute post hemorrhagic anemia, Melena Providers:                Estefana Keas DO, DO, Randall Lines, RN, Felice Sar, Technician Referring MD:              Medicines:                See the Anesthesia note for documentation of the                            administered medications Complications:            No immediate complications. Estimated Blood Loss:     Estimated blood loss was minimal. Procedure:                Pre-Anesthesia Assessment:                           - ASA Grade Assessment: III - A patient with severe                            systemic disease.                           - The risks and benefits of the procedure and the                            sedation options and risks were discussed with the                            patient. All questions were answered and informed                            consent was obtained.                           After obtaining informed consent, the endoscope was                            passed under direct vision. Throughout the                            procedure, the patient's blood pressure, pulse, and                            oxygen saturations were monitored continuously. The                            [id number]-H190 ([id number]) Olympus endoscope was  introduced                            through the mouth, and advanced to the second part                            of duodenum. The upper GI endoscopy was                            accomplished without difficulty. The patient                            tolerated the procedure well. Scope In: Scope Out: Findings:      The Z-line was regular.      Scattered  moderate inflammation characterized by congestion (edema),       erosions, erythema and linear erosions was found in the gastric body and       in the gastric antrum. Biopsies were taken with a cold forceps for       Helicobacter pylori testing.      Scattered moderate inflammation characterized by congestion (edema) and       erythema was found in the duodenal bulb.      The second portion of the duodenum was normal. Impression:               - Z-line regular.                           - Gastritis. Biopsied.                           - Duodenitis.                           - Normal second portion of the duodenum. Moderate Sedation:      See the other procedure note for documentation of moderate sedation with       intraservice time. Recommendation:           - Return patient to hospital ward for ongoing care.                           - Resume regular diet.                           - Continue present medications.                           - Await pathology results.                           - Use Protonix  (pantoprazole ) 40 mg PO daily for 2                            months. Procedure Code(s):        --- Professional ---                           [phone], Esophagogastroduodenoscopy, flexible,  transoral; with biopsy, single or multiple Diagnosis Code(s):        --- Professional ---                           K29.70, Gastritis, unspecified, without bleeding                           K29.80, Duodenitis without bleeding                           D62, Acute posthemorrhagic anemia                           K92.1, Melena (includes Hematochezia) CPT copyright 2022 American Medical Association. All rights reserved. The codes documented in this report are preliminary and upon coder review may  be revised to meet current compliance requirements. Dr Estefana Keas, DO Estefana Keas DO, DO 05/16/2024 12:16:03 PM Number of Addenda: 0

## 2024-05-16 NOTE — Anesthesia Postprocedure Evaluation (Signed)
 Anesthesia Post Note  Patient: Tim Torres  Procedure(s) Performed: EGD (ESOPHAGOGASTRODUODENOSCOPY)     Patient location during evaluation: PACU Anesthesia Type: MAC Level of consciousness: awake and alert Pain management: pain level controlled Vital Signs Assessment: post-procedure vital signs reviewed and stable Respiratory status: spontaneous breathing, nonlabored ventilation and respiratory function stable Cardiovascular status: blood pressure returned to baseline and stable Postop Assessment: no apparent nausea or vomiting Anesthetic complications: no   There were no known notable events for this encounter.  Last Vitals:  Vitals:   05/16/24 1302 05/16/24 1310  BP: 138/81 138/81  Pulse: 74 79  Resp: 19 14  Temp:  36.7 C  SpO2: 99% 96%    Last Pain:  Vitals:   05/16/24 1230  TempSrc:   PainSc: 0-No pain                 Almarie CHRISTELLA Marchi

## 2024-05-16 NOTE — ED Notes (Signed)
 Ambulated without difficulty.

## 2024-05-16 NOTE — Telephone Encounter (Signed)
 Copied from CRM [id number]. Topic: Clinical - Medical Advice >> May 16, 2024  2:17 PM Berneda FALCON wrote: Reason for CRM: Dr Briana would like to relay a message to see if we can do a complete blood count for this patient and to check his hemaglobin in a day or so as outpatient. I attempted to call the CAL as he requested to speak to the nurse or PCP about this, but was told by office to send CRM.  Dr. Briana callback number [phone]

## 2024-05-16 NOTE — Telephone Encounter (Signed)
 Spoke with patient and he will go to [facility] in the morning for lab recheck first thing in the morning as he is leaving to go out of town tomorrow and will not be back on Sunday.   Spoke with Dr. Briana to let him know I would order labs and contact patient.

## 2024-05-16 NOTE — Transfer of Care (Signed)
 Immediate Anesthesia Transfer of Care Note  Patient: Tim Torres  Procedure(s) Performed: EGD (ESOPHAGOGASTRODUODENOSCOPY)  Patient Location: PACU  Anesthesia Type:MAC  Level of Consciousness: awake, alert , and oriented  Airway & Oxygen Therapy: Patient Spontanous Breathing  Post-op Assessment: Report given to RN and Post -op Vital signs reviewed and stable  Post vital signs: Reviewed and stable  Last Vitals:  Vitals Value Taken Time  BP 110/61 05/16/24 12:13  Temp 36.2 C 05/16/24 12:13  Pulse 72 05/16/24 12:14  Resp 12 05/16/24 12:14  SpO2 97 % 05/16/24 12:14  Vitals shown include unfiled device data.  Last Pain:  Vitals:   05/16/24 1213  TempSrc: Temporal  PainSc: 0-No pain         Complications: There were no known notable events for this encounter.

## 2024-05-16 NOTE — Interval H&P Note (Signed)
 History and Physical Interval Note:  05/16/2024 10:36 AM  Tim Torres  has presented today for surgery, with the diagnosis of Melena, anemia.  The various methods of treatment have been discussed with the patient and family. After consideration of risks, benefits and other options for treatment, the patient has consented to  Procedure(s): EGD (ESOPHAGOGASTRODUODENOSCOPY) (N/A) as a surgical intervention.  The patient's history has been reviewed, patient examined, no change in status, stable for surgery.  I have reviewed the patient's chart and labs.  Questions were answered to the patient's satisfaction.     Estefana VEAR Keas

## 2024-05-16 NOTE — Anesthesia Preprocedure Evaluation (Addendum)
 Anesthesia Evaluation  Patient identified by MRN, date of birth, ID band Patient awake    Reviewed: Allergy & Precautions, NPO status , Patient's Chart, lab work & pertinent test results  Airway Mallampati: III  TM Distance: >3 FB Neck ROM: Full    Dental  (+) Chipped, Dental Advisory Given,    Pulmonary sleep apnea (couldnt tolerate CPAP) , former smoker   Pulmonary exam normal breath sounds clear to auscultation       Cardiovascular hypertension (159/91 preop, normally 130s SBP per pt), Pt. on medications + CAD and + Past MI (1997 w/ PCI)  Normal cardiovascular exam Rhythm:Regular Rate:Normal     Neuro/Psych negative neurological ROS  negative psych ROS   GI/Hepatic negative GI ROS, Neg liver ROS,,,  Endo/Other  negative endocrine ROSdiabetes, Well Controlled, Type 2, Oral Hypoglycemic Agents  BMI 33  Renal/GU negative Renal ROS  negative genitourinary   Musculoskeletal  (+) Arthritis , Osteoarthritis,    Abdominal  (+) + obese  Peds  Hematology negative hematology ROS (+)   Anesthesia Other Findings   Reproductive/Obstetrics negative OB ROS                              Anesthesia Physical Anesthesia Plan  ASA: 3  Anesthesia Plan: MAC   Post-op Pain Management:    Induction:   PONV Risk Score and Plan: 2 and Propofol  infusion and TIVA  Airway Management Planned: Natural Airway and Simple Face Mask  Additional Equipment: None  Intra-op Plan:   Post-operative Plan:   Informed Consent: I have reviewed the patients History and Physical, chart, labs and discussed the procedure including the risks, benefits and alternatives for the proposed anesthesia with the patient or authorized representative who has indicated his/her understanding and acceptance.       Plan Discussed with: CRNA  Anesthesia Plan Comments:         Anesthesia Quick Evaluation

## 2024-05-16 NOTE — Discharge Instructions (Signed)
 Tim Torres,  You are in the hospital because of bleeding per rectum and was found to have evidence of inflammation of your stomach and your intestine.  You will need to be on an acid reducer for the next 2 months at least.  Please follow-up with the gastroenterologist in addition to your primary care physician.  Is important that you get a repeat blood count to make sure that your blood counts remained stable, although your bleeding is appeared to have resolved.  Please return if you notice return of your bleeding.

## 2024-05-16 NOTE — Consult Note (Signed)
 [facility] Gastroenterology Consult  Referring Provider: No ref. provider found Primary Care Physician:  Geofm Glade PARAS, MD Primary Gastroenterologist: [facility]  Reason for Consultation: Melena, anemia  SUBJECTIVE:   HPI: Tim Torres is a 73 y.o. male with past medical history significant for type 2 diabetes mellitus, hypertension, chronic kidney disease.  Admitted from 04/12/2024 - 04/22/2024 for postoperative wound infection.  He underwent right peroneal nerve decompression on 03/30/2024 though began to have pain and drainage from wound, he required hospitalization for IV antibiotic therapy, was discharged on oral Levaquin  and linezolid  for 14 days.  Patient noted that he has been experiencing intermittent black-colored stools since he has been on antibiotic therapy.  He has been experiencing some epigastric abdominal discomfort which has since resolved.  He has completed antibiotic therapy has not seen further melena.  He takes a daily full dose aspirin .  No chest pain or shortness of breath.  He has previously been seen in [facility] office for chronic diarrhea, he had colonoscopy completed with Dr. Rosalie on 11/14/2023 which showed focal active colitis on biopsy, tubular adenoma x 1 in the cecum.  Recommended have repeat colonoscopy in 5 years.  At this time, patient noted that he had some loose stools with antibiotic therapy, though prior to that time his diarrhea had resolved.  He denied NSAID use.  Noted that he takes famotidine  daily.  No current PPI use.  In workup of his abdominal pain he had abdominal ultrasound completed on 04/18/2024 which showed common bile duct 2.1 mm, 2.3 cm cholelithiasis, mild gallbladder wall thickening.  He subsequently had HIDA scan completed on 04/18/2024 which showed ejection fraction of 93%, no biliary obstruction or acute cholecystitis.  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 Authorizing Provider  allopurinol  (ZYLOPRIM ) 300 MG tablet Take 1 tablet (300 mg total) by mouth daily. 01/23/24  Yes Burns, Glade PARAS, MD  aspirin  325 MG tablet Take 325 mg by mouth daily.   Yes [provider]  cyclobenzaprine  (FLEXERIL ) 10 MG tablet Take 10 mg by mouth 3 (three) times daily as needed for muscle spasms.   Yes [provider]  famotidine  (PEPCID ) 40 MG tablet Take 1 tablet (40 mg total) by mouth daily. Patient taking differently: Take 40 mg by mouth daily before breakfast. 05/09/24  Yes Burns, Glade PARAS, MD  glipiZIDE  (GLUCOTROL ) 10 MG tablet Take 1 tablet (10 mg total) by mouth 2 (two) times daily before a meal. 04/22/24  Yes Bergman, Meghan D, NP  lovastatin (MEVACOR) 40 MG tablet TAKE TWO TABLETS NIGHTLY Patient taking differently: Take 40 mg by mouth See admin instructions. Take 40 mg by mouth in the morning and evening 12/01/23  Yes Wilkinson, Dana E, FNP  metFORMIN  (GLUCOPHAGE ) 1000 MG tablet Take  1 tablet (1,000 mg total) by mouth 2 (two) times daily with a meal. Patient taking differently: Take 1,000 mg by mouth daily with breakfast. 04/22/24  Yes Bergman, Meghan D, NP  metoprolol  tartrate (LOPRESSOR ) 50 MG tablet TAKE ONE TABLET TWICE DAILY Patient taking differently: Take 50 mg by mouth See admin instructions. Take 50 mg by mouth in the morning and evening 02/09/24  Yes Burns, Glade PARAS, MD  TYLENOL  500  MG tablet Take 1,000 mg by mouth every 6 (six) hours as needed for mild pain (pain score 1-3) or headache.   Yes [provider]  zolpidem  (AMBIEN ) 5 MG tablet TAKE ONE TABLET AT BEDTIME AS NEEDED FOR SLEEP Patient taking differently: Take 5 mg by mouth at bedtime. 04/26/24  Yes Burns, Glade PARAS, MD  Accu-Chek Softclix Lancets lancets 3 (three) times daily. 05/02/24   [provider]  Blood Glucose Monitoring Suppl DEVI 1 kit by Does not apply route as directed. May substitute to any manufacturer covered by patient's insurance. 05/02/24   Geofm Glade PARAS, MD  diazepam  (VALIUM ) 5 MG tablet Take 1 tablet (5 mg total) by mouth every 12 (twelve) hours as needed for muscle spasms. Patient not taking: Reported on 05/15/2024 04/22/24   Bergman, Meghan D, NP  Glucose Blood (BLOOD GLUCOSE TEST STRIPS) STRP 1 each by In Vitro route in the morning, at noon, and at bedtime. May substitute to any manufacturer covered by patient's insurance. 05/02/24 06/01/24  Geofm Glade PARAS, MD  Lancet Device MISC 1 each by Does not apply route as directed. May substitute to any manufacturer covered by patient's insurance. 05/02/24 06/01/24  Geofm Glade PARAS, MD   Current Facility-Administered Medications  Medication Dose Route Frequency Provider Last Rate Last Admin   [MAR Hold] acetaminophen  (TYLENOL ) tablet 650 mg  650 mg Oral Q6H PRN Howerter, Justin B, DO       Or   [MAR Hold] acetaminophen  (TYLENOL ) suppository 650 mg  650 mg Rectal Q6H PRN Howerter, Justin B, DO       [MAR Hold] dextrose  50 % solution 50 mL  1 ampule Intravenous Q1H PRN Howerter, Justin B, DO       [MAR Hold] fentaNYL  (SUBLIMAZE ) injection 25 mcg  25 mcg Intravenous Q2H PRN Howerter, Justin B, DO       [MAR Hold] insulin  aspart (novoLOG ) injection 0-6 Units  0-6 Units Subcutaneous Q6H Howerter, Justin B, DO       [MAR Hold] naloxone (NARCAN) injection 0.4 mg  0.4 mg Intravenous PRN Howerter, Justin B, DO       [MAR Hold] ondansetron  (ZOFRAN ) injection  4 mg  4 mg Intravenous Q6H PRN Howerter, Justin B, DO       [MAR Hold] pantoprazole  (PROTONIX ) injection 40 mg  40 mg Intravenous Q12H Howerter, Justin B, DO   40 mg at 05/16/24 9180   Allergies as of 05/15/2024 - Review Complete 05/15/2024  Allergen Reaction Noted   Shellfish allergy Shortness Of Breath and Swelling 07/07/2012   Atorvastatin Other (See Comments) 12/15/2016   Carafate [sucralfate] Other (See Comments) 05/15/2024   Rosuvastatin Other (See Comments) 12/15/2016   History reviewed. No pertinent family history. Social History   Socioeconomic History   Marital status: Significant Other    Spouse name: Not on file   Number of children: Not on file   Years of education: Not on file   Highest education level: Not on file  Occupational History   Not on file  Tobacco Use  Smoking status: Former    Current packs/day: 0.00    Types: Cigarettes    Quit date: 10/15/1982    Years since quitting: 41.6   Smokeless tobacco: Never  Vaping Use   Vaping status: Never Used  Substance and Sexual Activity   Alcohol  use: Yes    Alcohol /week: 1.0 standard drink of alcohol     Types: 1 Cans of beer per week    Comment: occasionallyy   Drug use: Never   Sexual activity: Not on file  Other Topics Concern   Not on file  Social History Narrative   Not on file   Social Drivers of Health   Financial Resource Strain: Low Risk  (04/16/2024)   Overall Financial Resource Strain (CARDIA)    Difficulty of Paying Living Expenses: Not hard at all  Food Insecurity: No Food Insecurity (04/12/2024)   Hunger Vital Sign    Worried About Running Out of Food in the Last Year: Never true    Ran Out of Food in the Last Year: Never true  Transportation Needs: No Transportation Needs (04/13/2024)   PRAPARE - Administrator, Civil Service (Medical): No    Lack of Transportation (Non-Medical): No  Physical Activity: Inactive (04/16/2024)   Exercise Vital Sign    Days of Exercise per Week: 0  days    Minutes of Exercise per Session: 0 min  Stress: No Stress Concern Present (04/16/2024)   [employer] of Occupational Health - Occupational Stress Questionnaire    Feeling of Stress : Not at all  Social Connections: Socially Integrated (04/13/2024)   Social Connection and Isolation Panel    Frequency of Communication with Friends and Family: Three times a week    Frequency of Social Gatherings with Friends and Family: Three times a week    Attends Religious Services: More than 4 times per year    Active Member of Clubs or Organizations: Yes    Attends Banker Meetings: More than 4 times per year    Marital Status: Married  Catering manager Violence: Not At Risk (04/12/2024)   Humiliation, Afraid, Rape, and Kick questionnaire    Fear of Current or Ex-Partner: No    Emotionally Abused: No    Physically Abused: No    Sexually Abused: No   Review of Systems:  Review of Systems  Respiratory:  Negative for shortness of breath.   Cardiovascular:  Negative for chest pain.  Gastrointestinal:  Positive for abdominal pain, diarrhea and melena.    OBJECTIVE:   Temp:  [97.5 F (36.4 C)-97.9 F (36.6 C)] 97.8 F (36.6 C) (07/02 0920) Pulse Rate:  [64-88] 79 (07/02 1020) Resp:  [14-21] 21 (07/02 1020) BP: (126-170)/(62-105) 159/91 (07/02 1020) SpO2:  [93 %-100 %] 97 % (07/02 1020) Weight:  [888 kg] 111 kg (07/01 1644)   Physical Exam Constitutional:      General: He is not in acute distress.    Appearance: He is not ill-appearing, toxic-appearing or diaphoretic.  Cardiovascular:     Rate and Rhythm: Normal rate and regular rhythm.  Pulmonary:     Effort: No respiratory distress.     Breath sounds: Normal breath sounds.  Abdominal:     General: Bowel sounds are normal. There is no distension.     Palpations: Abdomen is soft.     Tenderness: There is no abdominal tenderness. There is no guarding.  Musculoskeletal:     Right lower leg: Edema present.     Left  lower  leg: No edema.  Skin:    General: Skin is warm and dry.  Neurological:     Mental Status: He is alert.     Labs: Recent Labs    05/15/24 0759 05/15/24 1656 05/16/24 0035 05/16/24 0500  WBC 3.5* 4.4  --  4.3  HGB 8.6 Repeated and verified X2.* 8.9* 7.7* 7.7*  HCT 26.5* 28.6* 25.4* 24.8*  PLT 164.0 181  --  167   BMET Recent Labs    05/15/24 1656 05/16/24 0500  NA 137 137  K 5.3* 4.8  CL 106 109  CO2 21* 22  GLUCOSE 152* 131*  BUN 34* 31*  CREATININE 1.87* 1.65*  CALCIUM  9.0 8.5*   LFT Recent Labs    05/16/24 0500  PROT 6.5  ALBUMIN 3.1*  AST 29  ALT 31  ALKPHOS 111  BILITOT 0.8   PT/INR No results for input(s): LABPROT, INR in the last 72 hours.  Diagnostic imaging: No results found.  IMPRESSION: Melena Acute blood loss anemia Postoperative wound infection Coronary artery disease on full dose aspirin  History myocardial infarction Type 2 diabetes mellitus Hypertension  PLAN: -Recommend EGD to further evaluate melena and acute blood loss anemia, discussed procedure with patient including benefits, alternatives and risks of bleeding/infection/perforation/anesthesia, he verbalized understanding elected to proceed -Maintain n.p.o. for procedure -Further recommendations to follow pending procedure   LOS: 0 days   Estefana Keas, DO [facility] Gastroenterology

## 2024-05-17 ENCOUNTER — Ambulatory Visit: Payer: Self-pay | Admitting: Internal Medicine

## 2024-05-17 ENCOUNTER — Other Ambulatory Visit (INDEPENDENT_AMBULATORY_CARE_PROVIDER_SITE_OTHER)

## 2024-05-17 DIAGNOSIS — D62 Acute posthemorrhagic anemia: Secondary | ICD-10-CM

## 2024-05-17 LAB — SURGICAL PATHOLOGY

## 2024-05-17 LAB — CBC WITH DIFFERENTIAL/PLATELET
Basophils Absolute: 0 10*3/uL (ref 0.0–0.1)
Basophils Relative: 0.8 % (ref 0.0–3.0)
Eosinophils Absolute: 0.3 10*3/uL (ref 0.0–0.7)
Eosinophils Relative: 7.1 % — ABNORMAL HIGH (ref 0.0–5.0)
HCT: 29.1 % — ABNORMAL LOW (ref 39.0–52.0)
Hemoglobin: 9.6 g/dL — ABNORMAL LOW (ref 13.0–17.0)
Lymphocytes Relative: 23.1 % (ref 12.0–46.0)
Lymphs Abs: 1.1 10*3/uL (ref 0.7–4.0)
MCHC: 32.9 g/dL (ref 30.0–36.0)
MCV: 90.3 fl (ref 78.0–100.0)
Monocytes Absolute: 0.4 10*3/uL (ref 0.1–1.0)
Monocytes Relative: 7.5 % (ref 3.0–12.0)
Neutro Abs: 2.9 10*3/uL (ref 1.4–7.7)
Neutrophils Relative %: 61.5 % (ref 43.0–77.0)
Platelets: 228 10*3/uL (ref 150.0–400.0)
RBC: 3.22 Mil/uL — ABNORMAL LOW (ref 4.22–5.81)
RDW: 16.1 % — ABNORMAL HIGH (ref 11.5–15.5)
WBC: 4.8 10*3/uL (ref 4.0–10.5)

## 2024-05-17 NOTE — Telephone Encounter (Signed)
 Patient had labs done today

## 2024-05-18 ENCOUNTER — Encounter (HOSPITAL_COMMUNITY): Payer: Self-pay | Admitting: Internal Medicine

## 2024-05-18 NOTE — Hospital Course (Signed)
 Tim Torres is a 74 y.o. male with a history of diabetes mellitus type 2, hypertension, CKD.  Patient presented secondary to low hemoglobin and found to have acute blood loss from an acute upper GI bleed. GI consulted and performed an upper GI endoscopy, revealing gastritis and duodenitis; biopsy obtained. Discharge with Protonix .

## 2024-05-21 ENCOUNTER — Other Ambulatory Visit: Payer: Self-pay

## 2024-05-21 ENCOUNTER — Telehealth: Payer: Self-pay | Admitting: Internal Medicine

## 2024-05-21 MED ORDER — METFORMIN HCL 1000 MG PO TABS: 1000.0000 mg | ORAL_TABLET | Freq: Two times a day (BID) | ORAL | 2 refills | Status: DC

## 2024-05-21 MED ORDER — GLIPIZIDE 10 MG PO TABS: 10.0000 mg | ORAL_TABLET | Freq: Two times a day (BID) | ORAL | 2 refills | Status: DC

## 2024-05-21 NOTE — Telephone Encounter (Signed)
 Copied from CRM [id number]. Topic: Clinical - Medication Question >> May 21, 2024  9:05 AM Silvana PARAS wrote: Reason for CRM: Pt calling to clarify dosage instructions on metFORMIN  (GLUCOPHAGE ) 1000 MG tablet, and glipiZIDE  (GLUCOTROL ) 10 MG tablet. Callback number is [phone].

## 2024-05-21 NOTE — Telephone Encounter (Signed)
 Copied from CRM [id number]. Topic: Clinical - Medication Refill >> May 21, 2024  9:01 AM Silvana PARAS wrote: Medication: metFORMIN  (GLUCOPHAGE ) 1000 MG tablet, and glipiZIDE  (GLUCOTROL ) 10 MG tablet   Has the patient contacted their pharmacy? Yes (Agent: If no, request that the patient contact the pharmacy for the refill. If patient does not wish to contact the pharmacy document the reason why and proceed with request.) (Agent: If yes, when and what did the pharmacy advise?)  This is the patient's preferred pharmacy:  [facility] [place], [place] - [address] [place] [address] Phone: [phone] Fax: [phone]  Is this the correct pharmacy for this prescription? Yes If no, delete pharmacy and type the correct one.   Has the prescription been filled recently? No  Is the patient out of the medication? Yes  Has the patient been seen for an appointment in the last year OR does the patient have an upcoming appointment? Yes  Can we respond through MyChart? Yes  Agent: Please be advised that Rx refills may take up to 3 business days. We ask that you follow-up with your pharmacy.

## 2024-05-21 NOTE — Telephone Encounter (Signed)
 Spoke with patient today.

## 2024-05-21 NOTE — Telephone Encounter (Signed)
 Patient requesting clarification on Metformin  and Glipizide  medication.   This triager not able to answer as instructions are conflicting.  :Sig: Take 1 tablet (1,000 mg total) by mouth 2 (two) times daily with a meal. Patient taking differently: Take 1,000 mg by mouth daily with breakfast.:  Please follow up with patient.

## 2024-05-22 DIAGNOSIS — T8141XA Infection following a procedure, superficial incisional surgical site, initial encounter: Secondary | ICD-10-CM | POA: Diagnosis not present

## 2024-05-22 DIAGNOSIS — N1831 Chronic kidney disease, stage 3a: Secondary | ICD-10-CM | POA: Diagnosis not present

## 2024-05-22 DIAGNOSIS — L03115 Cellulitis of right lower limb: Secondary | ICD-10-CM | POA: Diagnosis not present

## 2024-05-22 DIAGNOSIS — E1122 Type 2 diabetes mellitus with diabetic chronic kidney disease: Secondary | ICD-10-CM | POA: Diagnosis not present

## 2024-05-22 DIAGNOSIS — A419 Sepsis, unspecified organism: Secondary | ICD-10-CM | POA: Diagnosis not present

## 2024-05-22 DIAGNOSIS — T8144XA Sepsis following a procedure, initial encounter: Secondary | ICD-10-CM | POA: Diagnosis not present

## 2024-05-22 DIAGNOSIS — D631 Anemia in chronic kidney disease: Secondary | ICD-10-CM | POA: Diagnosis not present

## 2024-05-22 DIAGNOSIS — E1100 Type 2 diabetes mellitus with hyperosmolarity without nonketotic hyperglycemic-hyperosmolar coma (NKHHC): Secondary | ICD-10-CM | POA: Diagnosis not present

## 2024-05-22 DIAGNOSIS — I129 Hypertensive chronic kidney disease with stage 1 through stage 4 chronic kidney disease, or unspecified chronic kidney disease: Secondary | ICD-10-CM | POA: Diagnosis not present

## 2024-05-28 DIAGNOSIS — E7800 Pure hypercholesterolemia, unspecified: Secondary | ICD-10-CM | POA: Diagnosis not present

## 2024-05-28 DIAGNOSIS — E119 Type 2 diabetes mellitus without complications: Secondary | ICD-10-CM | POA: Diagnosis not present

## 2024-05-28 DIAGNOSIS — I2510 Atherosclerotic heart disease of native coronary artery without angina pectoris: Secondary | ICD-10-CM | POA: Diagnosis not present

## 2024-06-11 DIAGNOSIS — D62 Acute posthemorrhagic anemia: Secondary | ICD-10-CM | POA: Diagnosis not present

## 2024-06-11 DIAGNOSIS — K2901 Acute gastritis with bleeding: Secondary | ICD-10-CM | POA: Diagnosis not present

## 2024-06-16 ENCOUNTER — Other Ambulatory Visit: Payer: Self-pay | Admitting: Nurse Practitioner

## 2024-06-22 NOTE — Telephone Encounter (Signed)
 Copied from CRM [id number]. Topic: Clinical - Prescription Issue >> Jun 21, 2024  4:59 PM Chiquita SQUIBB wrote: Reason for CRM: Patient is calling in regarding the lovastatin (MEVACOR) 40 MG tablet [[id number]], the pharmacy submitted the refill on 08/02 and the patient has enough to last 2 days, so he is requesting if this can be sent over as soon as possible.

## 2024-07-09 DIAGNOSIS — D62 Acute posthemorrhagic anemia: Secondary | ICD-10-CM | POA: Diagnosis not present

## 2024-07-26 NOTE — Assessment & Plan Note (Signed)
3b

## 2024-07-26 NOTE — Progress Notes (Unsigned)
 Subjective:    Patient ID: Tim Torres, male    DOB: 11-22-1950, 73 y.o.   MRN: [id number]     HPI Tim Torres is here for a physical exam and his chronic medical problems.   A1c 5.2%  Having neck pain - has appt w/ dr poole next week.  Site of peroneal nerve surgery - still a knot and feels pain at times-right toe and foot feel funny - left toe feels a little funny.  He does have right foot drop since his initially back surgery.  He thinks the symptoms in his right foot started with his original back surgery.  He did recently break up with his girlfriend for several years and is having difficulty with that.    Medications and allergies reviewed with patient and updated if appropriate.  Current Outpatient Medications on File Prior to Visit  Medication Sig Dispense Refill   Accu-Chek Softclix Lancets lancets 3 (three) times daily.     allopurinol  (ZYLOPRIM ) 300 MG tablet Take 1 tablet (300 mg total) by mouth daily. 90 tablet 2   aspirin  325 MG tablet Take 325 mg by mouth daily.     Blood Glucose Monitoring Suppl DEVI 1 kit by Does not apply route as directed. May substitute to any manufacturer covered by patient's insurance. 1 each 0   cyclobenzaprine  (FLEXERIL ) 10 MG tablet Take 10 mg by mouth 3 (three) times daily as needed for muscle spasms.     [Paused] famotidine  (PEPCID ) 40 MG tablet Take 1 tablet (40 mg total) by mouth daily. (Patient taking differently: Take 40 mg by mouth daily before breakfast.) 90 tablet 1   metFORMIN  (GLUCOPHAGE ) 1000 MG tablet Take 1 tablet (1,000 mg total) by mouth 2 (two) times daily with a meal. 60 tablet 2   metoprolol  tartrate (LOPRESSOR ) 50 MG tablet TAKE ONE TABLET TWICE DAILY (Patient taking differently: Take 50 mg by mouth See admin instructions. Take 50 mg by mouth in the morning and evening) 180 tablet 1   pantoprazole  (PROTONIX ) 40 MG tablet Take 1 tablet (40 mg total) by mouth 2 (two) times daily. 120 tablet 0   TYLENOL  500 MG tablet  Take 1,000 mg by mouth every 6 (six) hours as needed for mild pain (pain score 1-3) or headache.     zolpidem  (AMBIEN ) 5 MG tablet TAKE ONE TABLET AT BEDTIME AS NEEDED FOR SLEEP (Patient taking differently: Take 5 mg by mouth at bedtime.) 30 tablet 5   No current facility-administered medications on file prior to visit.    Review of Systems  Constitutional:  Negative for fever.  HENT:  Positive for hearing loss.        Towards her nose-not healing  Eyes:  Negative for visual disturbance.  Respiratory:  Negative for cough, shortness of breath and wheezing.   Cardiovascular:  Positive for leg swelling (occ right foot swelling). Negative for chest pain and palpitations.  Gastrointestinal:  Positive for diarrhea. Negative for abdominal pain, blood in stool (no melena) and constipation.       No gerd  Musculoskeletal:  Positive for arthralgias, back pain and neck pain.  Skin:  Negative for rash.  Neurological:  Negative for dizziness, light-headedness and headaches.  Psychiatric/Behavioral:  Positive for sleep disturbance. Negative for dysphoric mood.        Objective:   Vitals:   07/27/24 0931  BP: 126/80  Pulse: (!) 58  Temp: 98.1 F (36.7 C)  SpO2: 97%   Filed Weights   07/27/24  0931  Weight: 234 lb (106.1 kg)   Body mass index is 31.74 kg/m.  BP Readings from Last 3 Encounters:  07/27/24 126/80  05/16/24 138/81  05/09/24 120/80    Wt Readings from Last 3 Encounters:  07/27/24 234 lb (106.1 kg)  05/15/24 244 lb 11.4 oz (111 kg)  05/09/24 245 lb (111.1 kg)      Physical Exam Constitutional: He appears well-developed and well-nourished. No distress.  HENT:  Head: Normocephalic and atraumatic.  Right Ear: External ear normal.  Left Ear: External ear normal.  Normal ear canals and TM b/l  Mouth/Throat: Oropharynx is clear and moist. Eyes: Conjunctivae and EOM are normal.  Neck: Neck supple. No tracheal deviation present. No thyromegaly present.  No carotid  bruit  Cardiovascular: Normal rate, regular rhythm, normal heart sounds and intact distal pulses.   No murmur heard.  No lower extremity edema. Pulmonary/Chest: Effort normal and breath sounds normal. No respiratory distress. He has no wheezes. He has no rales.  Abdominal: Soft. He exhibits no distension. There is no tenderness.  Lymphadenopathy:   He has no cervical adenopathy.  Skin: Skin is warm and dry. He is not diaphoretic.  Psychiatric: He has a normal mood and affect. His behavior is normal.    Diabetic Foot Exam - Simple   Simple Foot Form Diabetic Foot exam was performed with the following findings: Yes 07/27/2024 10:15 AM  Visual Inspection No deformities, no ulcerations, no other skin breakdown bilaterally: Yes Sensation Testing See comments: Yes Pulse Check Posterior Tibialis and Dorsalis pulse intact bilaterally: Yes Comments Right distal plantar foot with decreased sensation to light touch.  Normal sensation in left foot         Assessment & Plan:   Physical exam: Screening blood work  ordered Exercise   very active Weight  obese Substance abuse   none   Reviewed recommended immunizations.   Health Maintenance  Topic Date Due   Colonoscopy  Never done   Pneumococcal Vaccine: 50+ Years (3 of 3 - PCV20 or PCV21) 12/04/2018   DTaP/Tdap/Td (1 - Tdap) 12/12/2019   Medicare Annual Wellness (AWV)  01/13/2024   COVID-19 Vaccine (4 - 2025-26 season) 08/12/2024 (Originally 07/16/2024)   Zoster Vaccines- Shingrix (1 of 2) 10/26/2024 (Originally [birth year]/1971)   Influenza Vaccine  02/12/2025 (Originally 06/15/2024)   HEMOGLOBIN A1C  10/18/2024   OPHTHALMOLOGY EXAM  01/01/2025   Diabetic kidney evaluation - Urine ACR  01/22/2025   Diabetic kidney evaluation - eGFR measurement  05/16/2025   FOOT EXAM  07/27/2025   Hepatitis C Screening  Completed   HPV VACCINES  Aged Out   Meningococcal B Vaccine  Aged Out     See Problem List for Assessment and Plan of chronic  medical problems.

## 2024-07-26 NOTE — Patient Instructions (Addendum)
 Blood work was ordered.       Medications changes include :   bactroban  ointment for the nose.   Decrease glipizide  to once a day     Return in about 6 months (around 01/24/2025) for follow up.    Health Maintenance, Male Adopting a healthy lifestyle and getting preventive care are important in promoting health and wellness. Ask your health care provider about: The right schedule for you to have regular tests and exams. Things you can do on your own to prevent diseases and keep yourself healthy. What should I know about diet, weight, and exercise? Eat a healthy diet  Eat a diet that includes plenty of vegetables, fruits, low-fat dairy products, and lean protein. Do not eat a lot of foods that are high in solid fats, added sugars, or sodium. Maintain a healthy weight Body mass index (BMI) is a measurement that can be used to identify possible weight problems. It estimates body fat based on height and weight. Your health care provider can help determine your BMI and help you achieve or maintain a healthy weight. Get regular exercise Get regular exercise. This is one of the most important things you can do for your health. Most adults should: Exercise for at least 150 minutes each week. The exercise should increase your heart rate and make you sweat (moderate-intensity exercise). Do strengthening exercises at least twice a week. This is in addition to the moderate-intensity exercise. Spend less time sitting. Even light physical activity can be beneficial. Watch cholesterol and blood lipids Have your blood tested for lipids and cholesterol at 71 years of age, then have this test every 5 years. You may need to have your cholesterol levels checked more often if: Your lipid or cholesterol levels are high. You are older than 71 years of age. You are at high risk for heart disease. What should I know about cancer screening? Many types of cancers can be detected early and may often  be prevented. Depending on your health history and family history, you may need to have cancer screening at various ages. This may include screening for: Colorectal cancer. Prostate cancer. Skin cancer. Lung cancer. What should I know about heart disease, diabetes, and high blood pressure? Blood pressure and heart disease High blood pressure causes heart disease and increases the risk of stroke. This is more likely to develop in people who have high blood pressure readings or are overweight. Talk with your health care provider about your target blood pressure readings. Have your blood pressure checked: Every 3-5 years if you are 42-70 years of age. Every year if you are 54 years old or older. If you are between the ages of 33 and 51 and are a current or former smoker, ask your health care provider if you should have a one-time screening for abdominal aortic aneurysm (AAA). Diabetes Have regular diabetes screenings. This checks your fasting blood sugar level. Have the screening done: Once every three years after age 41 if you are at a normal weight and have a low risk for diabetes. More often and at a younger age if you are overweight or have a high risk for diabetes. What should I know about preventing infection? Hepatitis B If you have a higher risk for hepatitis B, you should be screened for this virus. Talk with your health care provider to find out if you are at risk for hepatitis B infection. Hepatitis C Blood testing is recommended for: Everyone born from  1943 through 15. Anyone with known risk factors for hepatitis C. Sexually transmitted infections (STIs) You should be screened each year for STIs, including gonorrhea and chlamydia, if: You are sexually active and are younger than 71 years of age. You are older than 71 years of age and your health care provider tells you that you are at risk for this type of infection. Your sexual activity has changed since you were last  screened, and you are at increased risk for chlamydia or gonorrhea. Ask your health care provider if you are at risk. Ask your health care provider about whether you are at high risk for HIV. Your health care provider may recommend a prescription medicine to help prevent HIV infection. If you choose to take medicine to prevent HIV, you should first get tested for HIV. You should then be tested every 3 months for as long as you are taking the medicine. Follow these instructions at home: Alcohol  use Do not drink alcohol  if your health care provider tells you not to drink. If you drink alcohol : Limit how much you have to 0-2 drinks a day. Know how much alcohol  is in your drink. In the [place] one drink equals one 12 oz bottle of beer (355 mL), one 5 oz glass of wine (148 mL), or one 1 oz glass of hard liquor (44 mL). Lifestyle Do not use any products that contain nicotine or tobacco. These products include cigarettes, chewing tobacco, and vaping devices, such as e-cigarettes. If you need help quitting, ask your health care provider. Do not use street drugs. Do not share needles. Ask your health care provider for help if you need support or information about quitting drugs. General instructions Schedule regular health, dental, and eye exams. Stay current with your vaccines. Tell your health care provider if: You often feel depressed. You have ever been abused or do not feel safe at home. Summary Adopting a healthy lifestyle and getting preventive care are important in promoting health and wellness. Follow your health care provider's instructions about healthy diet, exercising, and getting tested or screened for diseases. Follow your health care provider's instructions on monitoring your cholesterol and blood pressure. This information is not intended to replace advice given to you by your health care provider. Make sure you discuss any questions you have with your health care provider. Document  Revised: 03/23/2021 Document Reviewed: 03/23/2021 Elsevier Patient Education  2024 [employer].

## 2024-07-27 ENCOUNTER — Ambulatory Visit: Admitting: Internal Medicine

## 2024-07-27 ENCOUNTER — Other Ambulatory Visit: Payer: Self-pay | Admitting: Internal Medicine

## 2024-07-27 ENCOUNTER — Encounter: Payer: Self-pay | Admitting: Internal Medicine

## 2024-07-27 VITALS — BP 126/80 | HR 58 | Temp 98.1°F | Ht 72.0 in | Wt 234.0 lb

## 2024-07-27 DIAGNOSIS — G6289 Other specified polyneuropathies: Secondary | ICD-10-CM | POA: Diagnosis not present

## 2024-07-27 DIAGNOSIS — E1122 Type 2 diabetes mellitus with diabetic chronic kidney disease: Secondary | ICD-10-CM

## 2024-07-27 DIAGNOSIS — N1832 Chronic kidney disease, stage 3b: Secondary | ICD-10-CM | POA: Diagnosis not present

## 2024-07-27 DIAGNOSIS — M1A9XX0 Chronic gout, unspecified, without tophus (tophi): Secondary | ICD-10-CM

## 2024-07-27 DIAGNOSIS — E1159 Type 2 diabetes mellitus with other circulatory complications: Secondary | ICD-10-CM | POA: Diagnosis not present

## 2024-07-27 DIAGNOSIS — E538 Deficiency of other specified B group vitamins: Secondary | ICD-10-CM | POA: Diagnosis not present

## 2024-07-27 DIAGNOSIS — Z0000 Encounter for general adult medical examination without abnormal findings: Secondary | ICD-10-CM

## 2024-07-27 DIAGNOSIS — G4709 Other insomnia: Secondary | ICD-10-CM

## 2024-07-27 DIAGNOSIS — Z125 Encounter for screening for malignant neoplasm of prostate: Secondary | ICD-10-CM | POA: Diagnosis not present

## 2024-07-27 DIAGNOSIS — I152 Hypertension secondary to endocrine disorders: Secondary | ICD-10-CM

## 2024-07-27 DIAGNOSIS — E785 Hyperlipidemia, unspecified: Secondary | ICD-10-CM

## 2024-07-27 DIAGNOSIS — E1169 Type 2 diabetes mellitus with other specified complication: Secondary | ICD-10-CM

## 2024-07-27 DIAGNOSIS — G629 Polyneuropathy, unspecified: Secondary | ICD-10-CM | POA: Insufficient documentation

## 2024-07-27 DIAGNOSIS — I25118 Atherosclerotic heart disease of native coronary artery with other forms of angina pectoris: Secondary | ICD-10-CM

## 2024-07-27 LAB — CBC WITH DIFFERENTIAL/PLATELET
Basophils Absolute: 0 K/uL (ref 0.0–0.1)
Basophils Relative: 0.5 % (ref 0.0–3.0)
Eosinophils Absolute: 0.1 K/uL (ref 0.0–0.7)
Eosinophils Relative: 3.7 % (ref 0.0–5.0)
HCT: 34.4 % — ABNORMAL LOW (ref 39.0–52.0)
Hemoglobin: 11.3 g/dL — ABNORMAL LOW (ref 13.0–17.0)
Lymphocytes Relative: 28.4 % (ref 12.0–46.0)
Lymphs Abs: 1.1 K/uL (ref 0.7–4.0)
MCHC: 33 g/dL (ref 30.0–36.0)
MCV: 92.5 fl (ref 78.0–100.0)
Monocytes Absolute: 0.2 K/uL (ref 0.1–1.0)
Monocytes Relative: 6.5 % (ref 3.0–12.0)
Neutro Abs: 2.3 K/uL (ref 1.4–7.7)
Neutrophils Relative %: 60.9 % (ref 43.0–77.0)
Platelets: 121 K/uL — ABNORMAL LOW (ref 150.0–400.0)
RBC: 3.72 Mil/uL — ABNORMAL LOW (ref 4.22–5.81)
RDW: 15.8 % — ABNORMAL HIGH (ref 11.5–15.5)
WBC: 3.8 K/uL — ABNORMAL LOW (ref 4.0–10.5)

## 2024-07-27 LAB — COMPREHENSIVE METABOLIC PANEL WITH GFR
ALT: 20 U/L (ref 0–53)
AST: 25 U/L (ref 0–37)
Albumin: 4.4 g/dL (ref 3.5–5.2)
Alkaline Phosphatase: 85 U/L (ref 39–117)
BUN: 23 mg/dL (ref 6–23)
CO2: 26 meq/L (ref 19–32)
Calcium: 9.2 mg/dL (ref 8.4–10.5)
Chloride: 107 meq/L (ref 96–112)
Creatinine, Ser: 1.49 mg/dL (ref 0.40–1.50)
GFR: 46.88 mL/min — ABNORMAL LOW (ref >60.00)
Glucose, Bld: 89 mg/dL (ref 70–99)
Potassium: 5 meq/L (ref 3.5–5.1)
Sodium: 140 meq/L (ref 135–145)
Total Bilirubin: 0.4 mg/dL (ref 0.2–1.2)
Total Protein: 7 g/dL (ref 6.0–8.3)

## 2024-07-27 LAB — LIPID PANEL
Cholesterol: 136 mg/dL (ref 0–200)
HDL: 39.4 mg/dL (ref >39.00)
LDL Cholesterol: 73 mg/dL (ref 0–99)
NonHDL: 97.09
Total CHOL/HDL Ratio: 3
Triglycerides: 119 mg/dL (ref 0.0–149.0)
VLDL: 23.8 mg/dL (ref 0.0–40.0)

## 2024-07-27 LAB — VITAMIN B12: Vitamin B-12: 260 pg/mL (ref 211–911)

## 2024-07-27 LAB — POCT GLYCOSYLATED HEMOGLOBIN (HGB A1C)
HbA1c POC (<> result, manual entry): 5.2 % (ref 4.0–5.6)
HbA1c, POC (controlled diabetic range): 5.2 % (ref 0.0–7.0)
HbA1c, POC (prediabetic range): 5.2 % — AB (ref 5.7–6.4)
Hemoglobin A1C: 5.2 % (ref 4.0–5.6)

## 2024-07-27 LAB — PSA, MEDICARE: PSA: 0.58 ng/mL (ref 0.10–4.00)

## 2024-07-27 MED ORDER — MUPIROCIN 2 % EX OINT: 1.0000 | TOPICAL_OINTMENT | Freq: Two times a day (BID) | CUTANEOUS | 0 refills | Status: AC

## 2024-07-27 MED ORDER — GLIPIZIDE 10 MG PO TABS: 10.0000 mg | ORAL_TABLET | Freq: Every day | ORAL | 1 refills | Status: AC

## 2024-07-27 MED ORDER — LOVASTATIN 40 MG PO TABS: ORAL_TABLET | ORAL | 1 refills | Status: AC

## 2024-07-27 NOTE — Assessment & Plan Note (Signed)
 Chronic ?Check B12 level ?

## 2024-07-27 NOTE — Assessment & Plan Note (Signed)
 Chronic BP well controlled CBC, CMP Continue metoprolol  50 mg bid Encouraged to monitor at home

## 2024-07-27 NOTE — Assessment & Plan Note (Signed)
 Chronic Check CMP, lipid panel Continue lovastatin  80 mg daily

## 2024-07-27 NOTE — Assessment & Plan Note (Signed)
Chronic ?Controlled, stable ?Continue ambien 5 mg nightly ? ?

## 2024-07-27 NOTE — Assessment & Plan Note (Signed)
 Chronic Following with cardiology Denies angina or angina equivalent Continue metoprolol  50 mg bid, lovastatin  80 mg daily, ASA 325 mg daily CBC, CMP, lipid panel

## 2024-07-27 NOTE — Assessment & Plan Note (Signed)
 Chronic Controlled, stable No gout flares in years Continue allopurinol 300 mg daily

## 2024-07-27 NOTE — Assessment & Plan Note (Addendum)
 Chronic With CAD, hyperlipidemia, CKD stage IIIb Lab Results  Component Value Date   HGBA1C 8.1 (H) 04/18/2024   Sugars not ideally controlled A1c today 5.2%-much improved On Metformin  1000 mg twice daily, glipizide  10 mg twice daily AC Decrease glipizide  to 10 mg once a day-if he feels like he is having low sugars will need to eliminate this completely which I think eventually we we will definitely be able to do if he continues what he is doing

## 2024-07-27 NOTE — Assessment & Plan Note (Addendum)
 Chronic From back-his first surgery Also had peroneal nerve entrapment and some foot issues Unlikely related to diabetes He is careful with his feet and does check them regularly

## 2024-07-29 ENCOUNTER — Ambulatory Visit: Payer: Self-pay | Admitting: Internal Medicine

## 2024-08-02 DIAGNOSIS — M5412 Radiculopathy, cervical region: Secondary | ICD-10-CM | POA: Diagnosis not present

## 2024-08-06 ENCOUNTER — Encounter: Payer: Self-pay | Admitting: Internal Medicine

## 2024-08-07 ENCOUNTER — Other Ambulatory Visit: Payer: Self-pay

## 2024-08-07 MED ORDER — SILDENAFIL CITRATE 100 MG PO TABS: 100.0000 mg | ORAL_TABLET | ORAL | 4 refills | Status: AC | PRN

## 2024-08-27 DIAGNOSIS — L820 Inflamed seborrheic keratosis: Secondary | ICD-10-CM | POA: Diagnosis not present

## 2024-08-27 DIAGNOSIS — L821 Other seborrheic keratosis: Secondary | ICD-10-CM | POA: Diagnosis not present

## 2024-08-27 DIAGNOSIS — L570 Actinic keratosis: Secondary | ICD-10-CM | POA: Diagnosis not present

## 2024-08-27 DIAGNOSIS — D2222 Melanocytic nevi of left ear and external auricular canal: Secondary | ICD-10-CM | POA: Diagnosis not present

## 2024-08-27 DIAGNOSIS — L814 Other melanin hyperpigmentation: Secondary | ICD-10-CM | POA: Diagnosis not present

## 2024-08-31 ENCOUNTER — Other Ambulatory Visit: Payer: Self-pay | Admitting: Internal Medicine

## 2024-08-31 DIAGNOSIS — K529 Noninfective gastroenteritis and colitis, unspecified: Secondary | ICD-10-CM | POA: Diagnosis not present

## 2024-09-06 DIAGNOSIS — K529 Noninfective gastroenteritis and colitis, unspecified: Secondary | ICD-10-CM | POA: Diagnosis not present

## 2024-09-28 ENCOUNTER — Encounter: Payer: Self-pay | Admitting: Gastroenterology

## 2024-09-28 ENCOUNTER — Other Ambulatory Visit: Payer: Self-pay | Admitting: Gastroenterology

## 2024-09-28 DIAGNOSIS — K529 Noninfective gastroenteritis and colitis, unspecified: Secondary | ICD-10-CM

## 2024-10-01 ENCOUNTER — Encounter: Payer: Self-pay | Admitting: Gastroenterology

## 2024-10-03 ENCOUNTER — Other Ambulatory Visit

## 2024-10-10 ENCOUNTER — Ambulatory Visit
Admission: RE | Admit: 2024-10-10 | Discharge: 2024-10-10 | Disposition: A | Source: Ambulatory Visit | Attending: Gastroenterology | Admitting: Gastroenterology

## 2024-10-10 ENCOUNTER — Encounter: Payer: Self-pay | Admitting: Radiology

## 2024-10-10 DIAGNOSIS — K7460 Unspecified cirrhosis of liver: Secondary | ICD-10-CM | POA: Diagnosis not present

## 2024-10-10 DIAGNOSIS — K8020 Calculus of gallbladder without cholecystitis without obstruction: Secondary | ICD-10-CM | POA: Diagnosis not present

## 2024-10-10 DIAGNOSIS — K529 Noninfective gastroenteritis and colitis, unspecified: Secondary | ICD-10-CM

## 2024-10-10 MED ORDER — IOPAMIDOL (ISOVUE-300) INJECTION 61%
80.0000 mL | Freq: Once | INTRAVENOUS | Status: AC | PRN
  Administered 2024-10-10: 80 mL via INTRAVENOUS

## 2024-11-06 ENCOUNTER — Other Ambulatory Visit: Payer: Self-pay | Admitting: Internal Medicine

## 2024-12-03 ENCOUNTER — Other Ambulatory Visit: Payer: Self-pay | Admitting: Internal Medicine

## 2024-12-10 ENCOUNTER — Ambulatory Visit: Payer: Self-pay

## 2024-12-10 NOTE — Telephone Encounter (Signed)
 FYI Only or Action Required?: FYI only for provider: Home care.  Patient was last seen in primary care on 07/27/2024 by Geofm Glade PARAS, MD.  Called Nurse Triage reporting Cough.  Symptoms began 2 minutes ago.  Interventions attempted: OTC medications: mucinex, immodium.  Symptoms are: gradually worsening.  Triage Disposition: Home Care  Patient/caregiver understands and will follow disposition?: Yes  Reason for Triage: Coughing, vomiting, diarrhea, fever, ribs are sore from coughing wants to know if provider can call in something.  Reason for Disposition  Cough  Protocols used: Cough - Acute Productive-A-AH

## 2024-12-12 ENCOUNTER — Ambulatory Visit: Payer: Self-pay

## 2024-12-12 ENCOUNTER — Encounter: Payer: Self-pay | Admitting: Internal Medicine

## 2024-12-12 ENCOUNTER — Ambulatory Visit: Payer: Self-pay | Admitting: Internal Medicine

## 2024-12-12 ENCOUNTER — Ambulatory Visit (INDEPENDENT_AMBULATORY_CARE_PROVIDER_SITE_OTHER)

## 2024-12-12 ENCOUNTER — Ambulatory Visit: Admitting: Internal Medicine

## 2024-12-12 VITALS — BP 120/72 | HR 64 | Temp 98.3°F | Wt 229.0 lb

## 2024-12-12 DIAGNOSIS — E1159 Type 2 diabetes mellitus with other circulatory complications: Secondary | ICD-10-CM

## 2024-12-12 DIAGNOSIS — J209 Acute bronchitis, unspecified: Secondary | ICD-10-CM

## 2024-12-12 DIAGNOSIS — R062 Wheezing: Secondary | ICD-10-CM | POA: Diagnosis not present

## 2024-12-12 DIAGNOSIS — R0981 Nasal congestion: Secondary | ICD-10-CM

## 2024-12-12 DIAGNOSIS — I152 Hypertension secondary to endocrine disorders: Secondary | ICD-10-CM | POA: Diagnosis not present

## 2024-12-12 DIAGNOSIS — Z7984 Long term (current) use of oral hypoglycemic drugs: Secondary | ICD-10-CM | POA: Diagnosis not present

## 2024-12-12 LAB — POC INFLUENZA A&B (BINAX/QUICKVUE)

## 2024-12-12 LAB — POC COVID19 BINAXNOW: SARS Coronavirus 2 Ag: NEGATIVE

## 2024-12-12 MED ORDER — PREDNISONE 20 MG PO TABS: 40.0000 mg | ORAL_TABLET | Freq: Every day | ORAL | 0 refills | Status: AC

## 2024-12-12 MED ORDER — AMOXICILLIN-POT CLAVULANATE 875-125 MG PO TABS: 1.0000 | ORAL_TABLET | Freq: Two times a day (BID) | ORAL | 0 refills | Status: AC

## 2024-12-12 NOTE — Patient Instructions (Addendum)
" ° ° °  Medications changes include :   stop metformin .   Start augmentin  and prednisone .       Return in 4 weeks (on 01/09/2025) for follow up.  "

## 2024-12-12 NOTE — Telephone Encounter (Signed)
 FYI Only or Action Required?: FYI only for provider: appointment scheduled on 12/12/24.  Patient was last seen in primary care on 07/27/2024 by Geofm Glade PARAS, MD.  Called Nurse Triage reporting Cough and Sinusitis.  Symptoms began several days ago.  Interventions attempted: OTC medications: Mucinex DM.  Symptoms are: gradually worsening.  Triage Disposition: See Physician Within 24 Hours  Patient/caregiver understands and will follow disposition?: Yes                       Message from [place] C sent at 12/12/2024  8:33 AM EST  Reason for Triage: patient having blurred vision thought he had the flu   Reason for Disposition  Coughing up rusty-colored (reddish-brown) sputum  Answer Assessment - Initial Assessment Questions 1. ONSET: When did the cough begin?      Saturday started dry cough and runny nose.  2. SEVERITY: How bad is the cough today?      Moderate to Severe.  3. SPUTUM: Describe the color of your sputum (e.g., none, dry cough; clear, white, yellow, green)     Chest congestion, productive cough with brown mucous.  4. HEMOPTYSIS: Are you coughing up any blood? If Yes, ask: How much? (e.g., flecks, streaks, tablespoons, etc.)     No.  5. DIFFICULTY BREATHING: Are you having difficulty breathing? If Yes, ask: How bad is it? (e.g., mild, moderate, severe)      He states only when coughing and wheezing.  6. FEVER: Do you have a fever? If Yes, ask: What is your temperature, how was it measured, and when did it start?     No.  7. CARDIAC HISTORY: Do you have any history of heart disease? (e.g., heart attack, congestive heart failure)      HTN, CAD.  8. LUNG HISTORY: Do you have any history of lung disease?  (e.g., pulmonary embolus, asthma, emphysema)     No.  9. PE RISK FACTORS: Do you have a history of blood clots? (or: recent major surgery, recent prolonged travel, bedridden)     No.  10. OTHER SYMPTOMS: Do you have  any other symptoms? (e.g., runny nose, wheezing, chest pain)       Loss of appetite, pressure behind eyes,  blurry vision that's coming from the pressure behind my eyes/words on TV look blurry, abdomen sore from coughing, nasal congestion with some bloody mucous. No fever, loss of vision or double vision, unilateral numbness or weakness, chest pain, SOB  Protocols used: Cough - Acute Productive-A-AH

## 2024-12-12 NOTE — Progress Notes (Signed)
 "   Subjective:    Patient ID: Tim Torres, male    DOB: October 15, 1951, 74 y.o.   MRN: [id number]      HPI Tim Torres is here for  Chief Complaint  Patient presents with   Cough    Productive Cough x 4 days with brown mucus, nasal congestion, chest congestion.  Pt denies fevers and chills. Pt report taking mucinex D pill and liquid. Once every 12 hrs with pill version and q4hrs with liquid. Pt reports ringing in the ears.     Discussed the use of AI scribe software for clinical note transcription with the patient, who gave verbal consent to proceed.  History of Present Illness Tim Torres is a 74 year old male who presents with cough, wheezing, and diarrhea.  He developed a cough on Saturday evening, which has persisted. The cough is minimally productive and accompanied by wheezing. He experiences shortness of breath with exertion, describing a sensation of 'getting no air'. He has been using Mucinex DM in both tablet and liquid forms to manage his symptoms. He also reports ear fullness and pressure, attributing it to sinus congestion. No fever, chills, sore throat, or significant nasal drainage, though he notes some post-nasal drip.  He began experiencing diarrhea on Saturday night, which continued through Sunday. Imodium provided some relief, but symptoms recur when not taking it. He has a history of chronic diarrhea and has lost over ten pounds since Saturday, attributing this to the diarrhea. He is concerned about the cause of his diarrhea.  He describes a 'puffy spot' on his neck with 'little shock waves' running up the side of his face, down his neck, and into his shoulder. He is unsure of the cause but suspects it may be related to a nerve issue in his neck or shoulder. The sensation subsides with movement.  He has a history of smoking but quit years ago. He mentions recent exposure to secondhand smoke, which he believes may have triggered his respiratory symptoms. He denies  using an inhaler in the past.  He reports difficulty sleeping, having woken up at 1 AM and being unable to return to sleep, partly due to his cough. He plans to increase his fluid intake, including Gatorade, to stay hydrated.       Medications and allergies reviewed with patient and updated if appropriate.  Medications Ordered Prior to Encounter[1]  Review of Systems  Constitutional:  Negative for chills and fever.  HENT:  Positive for congestion, postnasal drip and tinnitus. Negative for ear pain (ears clogged), sinus pressure, sinus pain and sore throat.   Respiratory:  Positive for cough (productive - little discolored mucus), shortness of breath and wheezing.   Gastrointestinal:  Positive for abdominal pain (with coughing only), diarrhea (saturday - sunday) and vomiting (? taking medicine on medication on empty stomach).  Neurological:  Negative for dizziness, light-headedness and headaches.       Objective:   Vitals:   12/12/24 1107  BP: 120/72  Pulse: 64  Temp: 98.3 F (36.8 C)  SpO2: 97%   BP Readings from Last 3 Encounters:  12/12/24 120/72  07/27/24 126/80  05/16/24 138/81   Wt Readings from Last 3 Encounters:  12/12/24 229 lb (103.9 kg)  07/27/24 234 lb (106.1 kg)  05/15/24 244 lb 11.4 oz (111 kg)   Body mass index is 31.06 kg/m.    Physical Exam Constitutional:      General: He is not in acute distress.    Appearance:  Normal appearance. He is not ill-appearing.  HENT:     Head: Normocephalic.     Right Ear: Tympanic membrane, ear canal and external ear normal. There is no impacted cerumen.     Left Ear: Tympanic membrane, ear canal and external ear normal. There is no impacted cerumen.     Mouth/Throat:     Mouth: Mucous membranes are moist.     Pharynx: No oropharyngeal exudate or posterior oropharyngeal erythema.  Eyes:     Conjunctiva/sclera: Conjunctivae normal.  Cardiovascular:     Rate and Rhythm: Normal rate and regular rhythm.  Pulmonary:      Effort: Pulmonary effort is normal. No respiratory distress.     Breath sounds: Wheezing (Diffuse expiratory wheezing) present. No rales.  Musculoskeletal:     Cervical back: Neck supple. No tenderness.  Lymphadenopathy:     Cervical: No cervical adenopathy.  Skin:    General: Skin is warm and dry.     Findings: No rash.  Neurological:     Mental Status: He is alert.            Assessment & Plan:    See Problem List for Assessment and Plan of chronic medical problems.   Assessment and Plan Assessment & Plan Acute bronchitis with upper respiratory symptoms Acute bronchitis with wheezing and productive cough, likely exacerbated by smoke exposure. Negative COVID and flu tests. - Prescribed Augmentin  twice daily for one week. - Prescribed prednisone , 40 mg with food once daily for five days. - Continue Mucinex DM if it helps with cough. - Increase fluid intake, including Gatorade. - Advised to contact if symptoms do not improve.  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 - Discontinued metformin  to assess improvement in diarrhea. - Continue glipizide  10 mg daily. - Monitor stool consistency and report any changes.  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 Potential impact of prednisone  on blood sugar levels noted. - Continue glipizide  10 mg daily. - Scheduled follow-up in one month to reevaluate blood sugar levels.  Hypertension, CKD stage IIIb Chronic, blood pressure well-controlled here today. Stressed avoiding dehydration given infections, CKD, hypertension Continue current blood pressure medications-metoprolol  50 mg twice daily        [1]  Current Outpatient Medications on File Prior to Visit  Medication Sig Dispense Refill   Accu-Chek Softclix Lancets lancets 3 (three) times daily.     allopurinol  (ZYLOPRIM )  300 MG tablet TAKE ONE TABLET ONCE DAILY 90 tablet 2   aspirin  325 MG tablet Take 325 mg by mouth daily.     Blood Glucose Monitoring Suppl DEVI 1 kit by Does not apply route as directed. May substitute to any manufacturer covered by patient's insurance. 1 each 0   cyclobenzaprine  (FLEXERIL ) 10 MG tablet Take 10 mg by mouth 3 (three) times daily as needed for muscle spasms.     [Paused] famotidine  (PEPCID ) 40 MG tablet Take 1 tablet (40 mg total) by mouth daily. (Patient taking differently: Take 40 mg by mouth daily before breakfast.) 90 tablet 1   glipiZIDE  (GLUCOTROL ) 10 MG tablet Take 1 tablet (10 mg total) by mouth daily before breakfast. 90 tablet 1   lovastatin  (MEVACOR ) 40 MG tablet TAKE TWO TABLETS NIGHTLY 180 tablet 1   metFORMIN  (GLUCOPHAGE ) 1000 MG tablet TAKE ONE TABLET TWICE DAILY WITH MEAL(S) 60 tablet 2   metoprolol  tartrate (LOPRESSOR ) 50 MG tablet  TAKE ONE TABLET TWICE DAILY 180 tablet 1   mupirocin  ointment (BACTROBAN ) 2 % Apply 1 Application topically 2 (two) times daily. Use for 5 days and then as needed 22 g 0   pantoprazole  (PROTONIX ) 40 MG tablet Take 1 tablet (40 mg total) by mouth 2 (two) times daily. 120 tablet 0   sildenafil  (VIAGRA ) 100 MG tablet Take 1 tablet (100 mg total) by mouth as needed for erectile dysfunction. Take 100 mg by mouth as needed for erectile dysfunction. 10 tablet 4   TYLENOL  500 MG tablet Take 1,000 mg by mouth every 6 (six) hours as needed for mild pain (pain score 1-3) or headache.     zolpidem  (AMBIEN ) 5 MG tablet TAKE ONE TABLET AT BEDTIME AS NEEDED FOR SLEEP 30 tablet 5   No current facility-administered medications on file prior to visit.   "
